# Patient Record
Sex: Male | Born: 1955 | Race: Black or African American | Hispanic: No | Marital: Single | Smoking: Former smoker
Health system: Southern US, Community
[De-identification: ages and names within clinical notes are randomized; demographics above are authoritative.]

## PROBLEM LIST (undated history)

## (undated) DIAGNOSIS — N189 Chronic kidney disease, unspecified: Secondary | ICD-10-CM

## (undated) DIAGNOSIS — C801 Malignant (primary) neoplasm, unspecified: Secondary | ICD-10-CM

## (undated) DIAGNOSIS — I10 Essential (primary) hypertension: Secondary | ICD-10-CM

## (undated) DIAGNOSIS — D631 Anemia in chronic kidney disease: Secondary | ICD-10-CM

## (undated) DIAGNOSIS — M1990 Unspecified osteoarthritis, unspecified site: Secondary | ICD-10-CM

## (undated) DIAGNOSIS — J189 Pneumonia, unspecified organism: Secondary | ICD-10-CM

## (undated) DIAGNOSIS — K759 Inflammatory liver disease, unspecified: Secondary | ICD-10-CM

## (undated) DIAGNOSIS — M109 Gout, unspecified: Secondary | ICD-10-CM

## (undated) HISTORY — DX: Anemia in chronic kidney disease: D63.1

## (undated) HISTORY — PX: HERNIA REPAIR: SHX51

## (undated) HISTORY — DX: Chronic kidney disease, unspecified: N18.9

---

## 1999-06-08 ENCOUNTER — Encounter (HOSPITAL_BASED_OUTPATIENT_CLINIC_OR_DEPARTMENT_OTHER): Payer: Self-pay | Admitting: General Surgery

## 1999-06-08 ENCOUNTER — Encounter: Admission: RE | Admit: 1999-06-08 | Discharge: 1999-06-08 | Payer: Self-pay | Admitting: General Surgery

## 1999-06-11 ENCOUNTER — Ambulatory Visit (HOSPITAL_BASED_OUTPATIENT_CLINIC_OR_DEPARTMENT_OTHER): Admission: RE | Admit: 1999-06-11 | Discharge: 1999-06-11 | Payer: Self-pay | Admitting: General Surgery

## 1999-07-30 ENCOUNTER — Encounter (INDEPENDENT_AMBULATORY_CARE_PROVIDER_SITE_OTHER): Payer: Self-pay | Admitting: Specialist

## 1999-07-30 ENCOUNTER — Ambulatory Visit (HOSPITAL_BASED_OUTPATIENT_CLINIC_OR_DEPARTMENT_OTHER): Admission: RE | Admit: 1999-07-30 | Discharge: 1999-07-30 | Payer: Self-pay | Admitting: General Surgery

## 2000-08-14 ENCOUNTER — Encounter: Payer: Self-pay | Admitting: Family Medicine

## 2000-08-14 ENCOUNTER — Ambulatory Visit (HOSPITAL_COMMUNITY): Admission: RE | Admit: 2000-08-14 | Discharge: 2000-08-14 | Payer: Self-pay | Admitting: Family Medicine

## 2001-07-01 ENCOUNTER — Emergency Department (HOSPITAL_COMMUNITY): Admission: EM | Admit: 2001-07-01 | Discharge: 2001-07-01 | Payer: Self-pay | Admitting: Emergency Medicine

## 2003-11-29 ENCOUNTER — Ambulatory Visit: Payer: Self-pay | Admitting: Family Medicine

## 2003-11-30 ENCOUNTER — Ambulatory Visit: Payer: Self-pay | Admitting: Family Medicine

## 2003-11-30 ENCOUNTER — Ambulatory Visit: Payer: Self-pay | Admitting: *Deleted

## 2004-05-08 ENCOUNTER — Ambulatory Visit: Payer: Self-pay | Admitting: Family Medicine

## 2005-01-14 ENCOUNTER — Ambulatory Visit: Payer: Self-pay | Admitting: Family Medicine

## 2005-01-14 LAB — CONVERTED CEMR LAB: PSA: 0.7 ng/mL

## 2006-12-15 ENCOUNTER — Encounter (INDEPENDENT_AMBULATORY_CARE_PROVIDER_SITE_OTHER): Payer: Self-pay | Admitting: Family Medicine

## 2006-12-15 DIAGNOSIS — Z8619 Personal history of other infectious and parasitic diseases: Secondary | ICD-10-CM | POA: Insufficient documentation

## 2006-12-15 DIAGNOSIS — I10 Essential (primary) hypertension: Secondary | ICD-10-CM | POA: Insufficient documentation

## 2006-12-15 DIAGNOSIS — M1990 Unspecified osteoarthritis, unspecified site: Secondary | ICD-10-CM | POA: Insufficient documentation

## 2006-12-15 DIAGNOSIS — M109 Gout, unspecified: Secondary | ICD-10-CM | POA: Insufficient documentation

## 2007-01-13 ENCOUNTER — Emergency Department (HOSPITAL_COMMUNITY): Admission: EM | Admit: 2007-01-13 | Discharge: 2007-01-13 | Payer: Self-pay | Admitting: Emergency Medicine

## 2010-05-30 ENCOUNTER — Emergency Department (HOSPITAL_COMMUNITY)
Admission: EM | Admit: 2010-05-30 | Discharge: 2010-05-30 | Disposition: A | Discharge status: Home / Self Care | Payer: Self-pay | Attending: Emergency Medicine | Admitting: Emergency Medicine

## 2010-05-30 DIAGNOSIS — R110 Nausea: Secondary | ICD-10-CM | POA: Insufficient documentation

## 2010-05-30 DIAGNOSIS — R42 Dizziness and giddiness: Secondary | ICD-10-CM | POA: Insufficient documentation

## 2010-05-30 DIAGNOSIS — I10 Essential (primary) hypertension: Secondary | ICD-10-CM | POA: Insufficient documentation

## 2010-05-30 LAB — GLUCOSE, CAPILLARY: Glucose-Capillary: 111 mg/dL — ABNORMAL HIGH (ref 70–99)

## 2010-07-27 NOTE — Op Note (Signed)
[place]  Patient:    Brian Lawson, Brian Lawson                      MRN: [id number] Proc. Date: 07/30/99 Adm. Date:  [id number] Disc. Date: [id number] Attending:  Westly Pam Dictator:   Maia Plan. Lindon Romp, M.D.                           Operative Report  PREOPERATIVE DIAGNOSIS:  Incarcerated left inguinal hernia.  POSTOPERATIVE DIAGNOSIS:  Incarcerated left inguinal hernia.  OPERATION PERFORMED:  Repair of incarcerated left inguinal hernia.  SURGEON:  Dr. Lindon Romp.  ANESTHESIA:  General.  DESCRIPTION OF PROCEDURE:  The lower abdomen was prepped and draped in the usual manner.  A transverse curvilinear incision was made in the lower abdominal skin fold and deep and subcutaneous tissues.  The external oblique aponeurosis was opened, thus entering the inguinal canal.  The ilioinguinal nerve was identified and spared.  Spermatic cord was held aside with a Penrose drain.  The patient had a large incarcerated scrotal hernia.  The hernia sac was mobilized and opened, and an incarcerated piece of great omentum was pulled out into the wound.  It could not be returned to the abdominal cavity. It was excised after being clamped off with serial clamps which were tied off with 2-0 silk sutures.  The remainder of the omentum was then placed in the abdominal cavity.  The hernia sac was dissected free.  High ligation was performed with 2-0 silk suture ligature and 2-0 silk tie.  Excess hernia sac was left in the scrotum.  Defect in the floor was repaired, and the internal ring was made smaller using a Marlex patch.  This was fashioned and sewn in place with a two running 2-0 Novofil sutures both starting at the pubic tubercle, one running up the conjoin tendon, one running up shelving portion of Coopers ligament.  Both were tied at the internal ring, and then a collaring suture of 2-0 Novofil was used.  Hemostasis was good.  The external oblique aponeurosis was closed  superficial to the cord with running 2-0 Vicryl.  Vicryl 3-0 was used at the Scarpas fascia.  Skin was closed with subcutaneous 4-0 Dexon and Steri-Strips were applied.  ESTIMATED BLOOD LOSS:  Minimal.  BLOOD:  The patient received no blood.  Left the operating room in satisfactory condition after sponge and needle counts were verified. DD:  08/13/99 TD:  08/15/99 Job: [id number]

## 2010-12-18 LAB — I-STAT 8, (EC8 V) (CONVERTED LAB)
Acid-Base Excess: 3 — ABNORMAL HIGH
BUN: 15
Bicarbonate: 30.9 — ABNORMAL HIGH
Chloride: 106
Glucose, Bld: 114 — ABNORMAL HIGH
HCT: 57 — ABNORMAL HIGH
Hemoglobin: 19.4 — ABNORMAL HIGH
Operator id: 272551
Potassium: 4.6
Sodium: 139
TCO2: 33
pCO2, Ven: 56.3 — ABNORMAL HIGH
pH, Ven: 7.348 — ABNORMAL HIGH

## 2010-12-18 LAB — POCT I-STAT CREATININE
Creatinine, Ser: 1.6 — ABNORMAL HIGH
Operator id: 272551

## 2013-01-01 ENCOUNTER — Emergency Department (HOSPITAL_COMMUNITY): Payer: Medicaid Other

## 2013-01-01 ENCOUNTER — Inpatient Hospital Stay (HOSPITAL_COMMUNITY)
Admission: EM | Admit: 2013-01-01 | Discharge: 2013-01-25 | DRG: 853 | Disposition: A | Discharge status: Skilled Nursing Facility | Payer: Medicaid Other | Attending: Internal Medicine | Admitting: Internal Medicine

## 2013-01-01 ENCOUNTER — Encounter (HOSPITAL_COMMUNITY): Payer: Self-pay | Admitting: Emergency Medicine

## 2013-01-01 DIAGNOSIS — A481 Legionnaires' disease: Secondary | ICD-10-CM | POA: Diagnosis present

## 2013-01-01 DIAGNOSIS — N2581 Secondary hyperparathyroidism of renal origin: Secondary | ICD-10-CM | POA: Diagnosis present

## 2013-01-01 DIAGNOSIS — N179 Acute kidney failure, unspecified: Secondary | ICD-10-CM | POA: Diagnosis present

## 2013-01-01 DIAGNOSIS — R197 Diarrhea, unspecified: Secondary | ICD-10-CM | POA: Diagnosis present

## 2013-01-01 DIAGNOSIS — E875 Hyperkalemia: Secondary | ICD-10-CM | POA: Diagnosis present

## 2013-01-01 DIAGNOSIS — E872 Acidosis, unspecified: Secondary | ICD-10-CM | POA: Diagnosis present

## 2013-01-01 DIAGNOSIS — E860 Dehydration: Secondary | ICD-10-CM | POA: Diagnosis present

## 2013-01-01 DIAGNOSIS — E8809 Other disorders of plasma-protein metabolism, not elsewhere classified: Secondary | ICD-10-CM | POA: Diagnosis present

## 2013-01-01 DIAGNOSIS — R630 Anorexia: Secondary | ICD-10-CM | POA: Diagnosis present

## 2013-01-01 DIAGNOSIS — N186 End stage renal disease: Secondary | ICD-10-CM | POA: Diagnosis present

## 2013-01-01 DIAGNOSIS — M1990 Unspecified osteoarthritis, unspecified site: Secondary | ICD-10-CM

## 2013-01-01 DIAGNOSIS — J9600 Acute respiratory failure, unspecified whether with hypoxia or hypercapnia: Secondary | ICD-10-CM | POA: Diagnosis present

## 2013-01-01 DIAGNOSIS — N170 Acute kidney failure with tubular necrosis: Secondary | ICD-10-CM | POA: Diagnosis present

## 2013-01-01 DIAGNOSIS — J189 Pneumonia, unspecified organism: Secondary | ICD-10-CM

## 2013-01-01 DIAGNOSIS — R809 Proteinuria, unspecified: Secondary | ICD-10-CM | POA: Diagnosis present

## 2013-01-01 DIAGNOSIS — Z87891 Personal history of nicotine dependence: Secondary | ICD-10-CM

## 2013-01-01 DIAGNOSIS — M109 Gout, unspecified: Secondary | ICD-10-CM | POA: Diagnosis present

## 2013-01-01 DIAGNOSIS — R471 Dysarthria and anarthria: Secondary | ICD-10-CM | POA: Diagnosis present

## 2013-01-01 DIAGNOSIS — A419 Sepsis, unspecified organism: Principal | ICD-10-CM | POA: Diagnosis present

## 2013-01-01 DIAGNOSIS — Z8249 Family history of ischemic heart disease and other diseases of the circulatory system: Secondary | ICD-10-CM

## 2013-01-01 DIAGNOSIS — D696 Thrombocytopenia, unspecified: Secondary | ICD-10-CM | POA: Diagnosis present

## 2013-01-01 DIAGNOSIS — Z8619 Personal history of other infectious and parasitic diseases: Secondary | ICD-10-CM

## 2013-01-01 DIAGNOSIS — Q742 Other congenital malformations of lower limb(s), including pelvic girdle: Secondary | ICD-10-CM

## 2013-01-01 DIAGNOSIS — C649 Malignant neoplasm of unspecified kidney, except renal pelvis: Secondary | ICD-10-CM | POA: Diagnosis present

## 2013-01-01 DIAGNOSIS — R479 Unspecified speech disturbances: Secondary | ICD-10-CM | POA: Diagnosis not present

## 2013-01-01 DIAGNOSIS — N2889 Other specified disorders of kidney and ureter: Secondary | ICD-10-CM

## 2013-01-01 DIAGNOSIS — I10 Essential (primary) hypertension: Secondary | ICD-10-CM | POA: Diagnosis present

## 2013-01-01 DIAGNOSIS — G9341 Metabolic encephalopathy: Secondary | ICD-10-CM | POA: Diagnosis present

## 2013-01-01 DIAGNOSIS — I120 Hypertensive chronic kidney disease with stage 5 chronic kidney disease or end stage renal disease: Secondary | ICD-10-CM | POA: Diagnosis present

## 2013-01-01 DIAGNOSIS — N390 Urinary tract infection, site not specified: Secondary | ICD-10-CM | POA: Diagnosis present

## 2013-01-01 HISTORY — DX: Essential (primary) hypertension: I10

## 2013-01-01 HISTORY — DX: Gout, unspecified: M10.9

## 2013-01-01 LAB — COMPREHENSIVE METABOLIC PANEL
AST: 66 U/L — ABNORMAL HIGH (ref 0–37)
Alkaline Phosphatase: 60 U/L (ref 39–117)
BUN: 54 mg/dL — ABNORMAL HIGH (ref 6–23)
CO2: 22 mEq/L (ref 19–32)
Chloride: 99 mEq/L (ref 96–112)
Creatinine, Ser: 3.93 mg/dL — ABNORMAL HIGH (ref 0.50–1.35)
GFR calc non Af Amer: 15 mL/min — ABNORMAL LOW (ref >90)
Total Bilirubin: 0.9 mg/dL (ref 0.3–1.2)

## 2013-01-01 LAB — URINALYSIS, ROUTINE W REFLEX MICROSCOPIC
Bilirubin Urine: NEGATIVE
Glucose, UA: NEGATIVE mg/dL
Ketones, ur: NEGATIVE mg/dL
Leukocytes, UA: NEGATIVE
Protein, ur: 100 mg/dL — AB

## 2013-01-01 LAB — CBC WITH DIFFERENTIAL/PLATELET
Basophils Absolute: 0 10*3/uL (ref 0.0–0.1)
HCT: 45.7 % (ref 39.0–52.0)
Hemoglobin: 15.4 g/dL (ref 13.0–17.0)
Lymphocytes Relative: 8 % — ABNORMAL LOW (ref 12–46)
Monocytes Absolute: 0.7 10*3/uL (ref 0.1–1.0)
Monocytes Relative: 6 % (ref 3–12)
Neutro Abs: 10.2 10*3/uL — ABNORMAL HIGH (ref 1.7–7.7)
WBC: 12 10*3/uL — ABNORMAL HIGH (ref 4.0–10.5)

## 2013-01-01 LAB — URINE MICROSCOPIC-ADD ON

## 2013-01-01 MED ORDER — SODIUM CHLORIDE 0.9 % IV SOLN
INTRAVENOUS | Status: DC
  Administered 2013-01-01 (×2): via INTRAVENOUS

## 2013-01-01 MED ORDER — ALUM & MAG HYDROXIDE-SIMETH 200-200-20 MG/5ML PO SUSP
30.0000 mL | Freq: Four times a day (QID) | ORAL | Status: DC | PRN
  Filled 2013-01-01: qty 30

## 2013-01-01 MED ORDER — VANCOMYCIN HCL IN DEXTROSE 1-5 GM/200ML-% IV SOLN
1000.0000 mg | Freq: Once | INTRAVENOUS | Status: AC
  Administered 2013-01-01: 1000 mg via INTRAVENOUS
  Filled 2013-01-01: qty 200

## 2013-01-01 MED ORDER — ONDANSETRON HCL 4 MG PO TABS
4.0000 mg | ORAL_TABLET | Freq: Four times a day (QID) | ORAL | Status: DC | PRN
  Administered 2013-01-06 – 2013-01-22 (×3): 4 mg via ORAL
  Filled 2013-01-01 (×3): qty 1

## 2013-01-01 MED ORDER — SODIUM CHLORIDE 0.9 % IV BOLUS (SEPSIS)
1000.0000 mL | Freq: Once | INTRAVENOUS | Status: AC
  Administered 2013-01-01: 1000 mL via INTRAVENOUS

## 2013-01-01 MED ORDER — ACETAMINOPHEN 500 MG PO TABS
1000.0000 mg | ORAL_TABLET | Freq: Once | ORAL | Status: AC
  Administered 2013-01-01: 1000 mg via ORAL
  Filled 2013-01-01: qty 2

## 2013-01-01 MED ORDER — ACETAMINOPHEN 650 MG RE SUPP: 650.0000 mg | Freq: Four times a day (QID) | RECTAL | Status: DC | PRN

## 2013-01-01 MED ORDER — SODIUM CHLORIDE 0.9 % IV SOLN: INTRAVENOUS | Status: AC

## 2013-01-01 MED ORDER — IBUPROFEN 800 MG PO TABS
800.0000 mg | ORAL_TABLET | Freq: Once | ORAL | Status: AC
  Administered 2013-01-01: 800 mg via ORAL
  Filled 2013-01-01: qty 1

## 2013-01-01 MED ORDER — PIPERACILLIN-TAZOBACTAM 3.375 G IVPB 30 MIN
3.3750 g | Freq: Three times a day (TID) | INTRAVENOUS | Status: DC
  Filled 2013-01-01: qty 50

## 2013-01-01 MED ORDER — ONDANSETRON HCL 4 MG/2ML IJ SOLN
4.0000 mg | Freq: Four times a day (QID) | INTRAMUSCULAR | Status: DC | PRN
  Administered 2013-01-06 – 2013-01-10 (×2): 4 mg via INTRAVENOUS
  Filled 2013-01-01 (×2): qty 2

## 2013-01-01 MED ORDER — OXYCODONE HCL 5 MG PO TABS
5.0000 mg | ORAL_TABLET | ORAL | Status: DC | PRN
  Administered 2013-01-03 – 2013-01-18 (×8): 5 mg via ORAL
  Filled 2013-01-01 (×9): qty 1

## 2013-01-01 MED ORDER — HYDROMORPHONE HCL PF 1 MG/ML IJ SOLN
0.5000 mg | INTRAMUSCULAR | Status: DC | PRN
  Administered 2013-01-03 – 2013-01-19 (×4): 1 mg via INTRAVENOUS
  Administered 2013-01-20: 0.5 mg via INTRAVENOUS
  Administered 2013-01-21: 1 mg via INTRAVENOUS
  Filled 2013-01-01 (×6): qty 1

## 2013-01-01 MED ORDER — PIPERACILLIN-TAZOBACTAM IN DEX 2-0.25 GM/50ML IV SOLN
2.2500 g | Freq: Four times a day (QID) | INTRAVENOUS | Status: DC
  Administered 2013-01-02 (×2): 2.25 g via INTRAVENOUS
  Filled 2013-01-01 (×4): qty 50

## 2013-01-01 MED ORDER — ENOXAPARIN SODIUM 30 MG/0.3ML ~~LOC~~ SOLN
30.0000 mg | SUBCUTANEOUS | Status: DC
  Administered 2013-01-01 – 2013-01-04 (×3): 30 mg via SUBCUTANEOUS
  Filled 2013-01-01 (×4): qty 0.3

## 2013-01-01 MED ORDER — ACETAMINOPHEN 325 MG PO TABS
650.0000 mg | ORAL_TABLET | Freq: Four times a day (QID) | ORAL | Status: DC | PRN
  Administered 2013-01-02 (×2): 650 mg via ORAL
  Filled 2013-01-01 (×2): qty 2

## 2013-01-01 MED ORDER — PIPERACILLIN-TAZOBACTAM 3.375 G IVPB 30 MIN
3.3750 g | Freq: Once | INTRAVENOUS | Status: AC
  Administered 2013-01-01: 3.375 g via INTRAVENOUS
  Filled 2013-01-01: qty 50

## 2013-01-01 NOTE — ED Provider Notes (Signed)
CSN: [id number]     Arrival date & time 01/01/13  1756 History   First MD Initiated Contact with Patient 01/01/13 1804     Chief Complaint  Patient presents with  . Loss of Consciousness  . Medication Reaction   (Consider location/radiation/quality/duration/timing/severity/associated sxs/prior Treatment) HPI Comments: 57 yo male with htn, gout hx presents with confusion, general weakness, cough and diarrhea for three days.  Parents found him lying down on the ground unable to lift himself bc general weakness. Pt has not felt well for three days, mild diarrhea and cough.  No sick or jail contacts, no travel, no known HIV or immunodeficiency.   No recent hospitalizations.  Nothing improves sxs.    Patient is a 57 y.o. male presenting with syncope. The history is provided by the patient and a relative.  Loss of Consciousness Associated symptoms: fever, headaches (mild frontal similar previous), nausea and weakness   Associated symptoms: no chest pain, no shortness of breath and no vomiting     Past Medical History  Diagnosis Date  . Hypertension   . Gout    Past Surgical History  Procedure Laterality Date  . Hernia repair     History reviewed. No pertinent family history. History  Substance Use Topics  . Smoking status: Former Research scientist (life sciences)  . Smokeless tobacco: Never Used  . Alcohol Use: Yes     Comment: ocassionally    Review of Systems  Constitutional: Positive for fever, chills and appetite change.  HENT: Negative for congestion and drooling.   Eyes: Negative for visual disturbance.  Respiratory: Positive for cough. Negative for shortness of breath.   Cardiovascular: Positive for syncope. Negative for chest pain.  Gastrointestinal: Positive for nausea and diarrhea. Negative for vomiting and abdominal pain.  Genitourinary: Negative for dysuria.  Musculoskeletal: Negative for back pain.  Skin: Negative for rash.  Neurological: Positive for weakness, light-headedness and  headaches (mild frontal similar previous).    Allergies  Review of patient's allergies indicates no known allergies.  Home Medications   Current Outpatient Rx  Name  Route  Sig  Dispense  Refill  . allopurinol (ZYLOPRIM) 100 MG tablet   Oral   Take 100 mg by mouth daily.         Marland Kitchen lisinopril-hydrochlorothiazide (PRINZIDE,ZESTORETIC) 10-12.5 MG per tablet   Oral   Take 1 tablet by mouth daily.          BP 133/84  Pulse 111  Temp(Src) 103 F (39.4 C) (Oral)  Resp 39  SpO2 95% Physical Exam  Nursing note and vitals reviewed. Constitutional: He is oriented to person, place, and time. He appears well-developed and well-nourished.  HENT:  Head: Normocephalic and atraumatic.  Eyes: Conjunctivae are normal. Right eye exhibits no discharge. Left eye exhibits no discharge.  Neck: Normal range of motion. Neck supple. No tracheal deviation present.  Cardiovascular: Normal rate and regular rhythm.   Pulmonary/Chest: Effort normal. He has rales (few rales at bases).  Abdominal: Soft. He exhibits no distension. There is no tenderness. There is no guarding.  Musculoskeletal: He exhibits no edema.  Neurological: He is alert and oriented to person, place, and time. No cranial nerve deficit.  General weakness Mild lethargy Neck supple, full rom, chin to chest without difficulty Moves all ext equal bilateral  Skin: Skin is warm. No rash noted.  Psychiatric: He has a normal mood and affect.    ED Course  Procedures (including critical care time) Labs Review Labs Reviewed  CBC WITH DIFFERENTIAL -  Abnormal; Notable for the following:    WBC 12.0 (*)    RDW 16.0 (*)    Platelets 112 (*)    Neutrophils Relative % 85 (*)    Neutro Abs 10.2 (*)    Lymphocytes Relative 8 (*)    All other components within normal limits  COMPREHENSIVE METABOLIC PANEL - Abnormal; Notable for the following:    Glucose, Bld 140 (*)    BUN 54 (*)    Creatinine, Ser 3.93 (*)    Total Protein 8.4 (*)     Albumin 3.0 (*)    AST 66 (*)    GFR calc non Af Amer 15 (*)    GFR calc Af Amer 18 (*)    All other components within normal limits  LACTIC ACID, PLASMA - Abnormal; Notable for the following:    Lactic Acid, Venous 2.4 (*)    All other components within normal limits  URINALYSIS, ROUTINE W REFLEX MICROSCOPIC - Abnormal; Notable for the following:    APPearance CLOUDY (*)    Hgb urine dipstick LARGE (*)    Protein, ur 100 (*)    All other components within normal limits  SALICYLATE LEVEL - Abnormal; Notable for the following:    Salicylate Lvl 123456 (*)    All other components within normal limits  URINE MICROSCOPIC-ADD ON - Abnormal; Notable for the following:    Bacteria, UA MANY (*)    All other components within normal limits  CULTURE, BLOOD (ROUTINE X 2)  CULTURE, BLOOD (ROUTINE X 2)   Imaging Review Ct Head Wo Contrast  01/01/2013   CLINICAL DATA:  Loss of consciousness  EXAM: CT HEAD WITHOUT CONTRAST  TECHNIQUE: Contiguous axial images were obtained from the base of the skull through the vertex without intravenous contrast.  COMPARISON:  01/13/2007  FINDINGS: No skull fracture is noted. Paranasal sinuses and mastoid air cells are unremarkable. No intracranial hemorrhage, mass effect or midline shift. Stable patchy subcortical white matter decreased attenuation consistent with chronic small vessel ischemic changes. No definite acute cortical infarction. No mass lesion is noted on this unenhanced scan. Ventricular size is stable from prior exam.  IMPRESSION: No acute intracranial abnormality. Stable patchy subcortical white matter decreased attenuation consistent with chronic small vessel ischemic changes.   Electronically Signed   By: Lahoma Crocker M.D.   On: 01/01/2013 19:39   Dg Chest Port 1 View  01/01/2013   CLINICAL DATA:  Fever.  EXAM: PORTABLE CHEST - 1 VIEW  COMPARISON:  None.  FINDINGS: Cardiopericardial silhouette and mediastinal contours appear within normal limits. There  is patchy density in the left base favored to represent airspace disease/ pneumonia over atelectasis. This likely resides within the left lower lobe. Increased retrocardiac density is present. Monitoring leads project over the chest. Followup to ensure radiographic clearing and exclude an underlying lesion is recommended. Typically clearing will be observed at 8 weeks.  IMPRESSION: Left lower lobe patchy airspace disease compatible with pneumonia.   Electronically Signed   By: Dereck Ligas M.D.   On: 01/01/2013 19:07    EKG Interpretation   None       MDM   1. Severe sepsis   2. CAP (community acquired pneumonia)    Septic appearing, labs/ cultures/ abx.  Pt improved in ED with 2 L fluids, rechecked multiple times. Broad abx given.  No risk factors known per family.   Pneumonia on cxr, reviewed.   The patients results and plan were reviewed and discussed.  Any x-rays performed were personally reviewed by myself.   Differential diagnosis were considered with the presenting HPI.  Diagnosis: Severe sepsis, CAP, Dehydration, UTI  Discussed with Dr Arnoldo Morale, accepted step down, updated pt and family.  Admission/ observation were discussed with the admitting physician, patient and/or family and they are comfortable with the plan.      Mariea Clonts, MD 01/01/13 2132

## 2013-01-01 NOTE — Progress Notes (Signed)
Unit CM UR Completed by [facility] ED CM  W. Beaux Verne RN  

## 2013-01-01 NOTE — Progress Notes (Addendum)
ANTIBIOTIC CONSULT NOTE - INITIAL  Pharmacy Consult for vancomycin Indication: pneumonia  No Known Allergies  Patient Measurements: Wt= 97.1kg  Vital Signs: Temp: 103 F (39.4 C) (10/24 2000) Temp src: Oral (10/24 2000) BP: 133/84 mmHg (10/24 2100) Pulse Rate: 111 (10/24 2100) Intake/Output from previous day:   Intake/Output from this shift: Total I/O In: -  Out: 300 [Urine:300]  Labs:  Recent Labs  01/01/13 1859  WBC 12.0*  HGB 15.4  PLT 112*  CREATININE 3.93*   CrCl is unknown because there is no height on file for the current visit. No results found for this basename: VANCOTROUGH, VANCOPEAK, VANCORANDOM, GENTTROUGH, GENTPEAK, GENTRANDOM, TOBRATROUGH, TOBRAPEAK, TOBRARND, AMIKACINPEAK, AMIKACINTROU, AMIKACIN,  in the last 72 hours   Microbiology: No results found for this or any previous visit (from the past 720 hour(s)).  Medical History: Past Medical History  Diagnosis Date  . Hypertension   . Gout     Assessment: 57 yo male with PNA to begin vancomycin per pharmacy consult (also noted on zosyn). WBC= 12, tmax= 105.5, SCr= 3.93 and estimated CrCl ~ 20. Vancomycin 1000mg  IV was given in ED at 9:21pm today. Last SCr on record was 1.6 in 2008.  Abx 10/24 zosyn 10/24 vancomycin  Cx 10/24 blood x2  Goal of Therapy:  Vancomycin trough level 15-20 mcg/ml  Plan: -Vancomycin 1000mg  IV (to complete a total load of 2000mg )  -Will follow SCr trend in am to determine maintenance dosing -Will follow renal function, cultures and clinical progress -Change zosyn to 2.25gm IV q6h  Hildred Laser, Pharm D 01/01/2013 10:29 PM

## 2013-01-01 NOTE — ED Notes (Signed)
[facility] presents with a 57 yo male from home found unresponsive by mother.  Pt was found on the floor and incontinent.  Mother states she had not seen pt since Monday and pt was fine at that time.  Pt c/o diarrhea and anorexia for past 3 days with weakness.  Pt is currently A&Ox4.  Pt stated that he may have taken more of his gout medication than he should and didn't want to come to hospital.  Hx of gout and HTN.

## 2013-01-02 ENCOUNTER — Inpatient Hospital Stay (HOSPITAL_COMMUNITY): Payer: Medicaid Other

## 2013-01-02 DIAGNOSIS — J189 Pneumonia, unspecified organism: Secondary | ICD-10-CM

## 2013-01-02 DIAGNOSIS — M109 Gout, unspecified: Secondary | ICD-10-CM

## 2013-01-02 DIAGNOSIS — A481 Legionnaires' disease: Secondary | ICD-10-CM | POA: Diagnosis present

## 2013-01-02 DIAGNOSIS — N179 Acute kidney failure, unspecified: Secondary | ICD-10-CM | POA: Diagnosis present

## 2013-01-02 DIAGNOSIS — A419 Sepsis, unspecified organism: Principal | ICD-10-CM

## 2013-01-02 DIAGNOSIS — N390 Urinary tract infection, site not specified: Secondary | ICD-10-CM

## 2013-01-02 DIAGNOSIS — I10 Essential (primary) hypertension: Secondary | ICD-10-CM

## 2013-01-02 DIAGNOSIS — R6520 Severe sepsis without septic shock: Secondary | ICD-10-CM | POA: Diagnosis present

## 2013-01-02 LAB — PROCALCITONIN: Procalcitonin: 41.39 ng/mL

## 2013-01-02 LAB — BASIC METABOLIC PANEL
BUN: 59 mg/dL — ABNORMAL HIGH (ref 6–23)
CO2: 20 mEq/L (ref 19–32)
Calcium: 8.5 mg/dL (ref 8.4–10.5)
Creatinine, Ser: 4.67 mg/dL — ABNORMAL HIGH (ref 0.50–1.35)
Glucose, Bld: 122 mg/dL — ABNORMAL HIGH (ref 70–99)
Sodium: 138 mEq/L (ref 135–145)

## 2013-01-02 LAB — CBC
HCT: 43.6 % (ref 39.0–52.0)
MCH: 28.9 pg (ref 26.0–34.0)
MCHC: 34.2 g/dL (ref 30.0–36.0)
MCV: 84.7 fL (ref 78.0–100.0)
Platelets: 100 10*3/uL — ABNORMAL LOW (ref 150–400)
RDW: 16.1 % — ABNORMAL HIGH (ref 11.5–15.5)
WBC: 11.7 10*3/uL — ABNORMAL HIGH (ref 4.0–10.5)

## 2013-01-02 MED ORDER — BOOST / RESOURCE BREEZE PO LIQD
1.0000 | Freq: Three times a day (TID) | ORAL | Status: DC
  Administered 2013-01-04 – 2013-01-16 (×19): 1 via ORAL
  Filled 2013-01-02 (×3): qty 1

## 2013-01-02 MED ORDER — CEFTRIAXONE SODIUM 1 G IJ SOLR
1.0000 g | INTRAMUSCULAR | Status: DC
  Administered 2013-01-02 – 2013-01-03 (×2): 1 g via INTRAVENOUS
  Filled 2013-01-02 (×2): qty 10

## 2013-01-02 MED ORDER — DEXTROSE 5 % IV SOLN
500.0000 mg | INTRAVENOUS | Status: DC
  Administered 2013-01-02 – 2013-01-03 (×2): 500 mg via INTRAVENOUS
  Filled 2013-01-02 (×3): qty 500

## 2013-01-02 MED ORDER — SODIUM CHLORIDE 0.9 % IV BOLUS (SEPSIS)
500.0000 mL | Freq: Once | INTRAVENOUS | Status: AC
  Administered 2013-01-02: 500 mL via INTRAVENOUS

## 2013-01-02 NOTE — H&P (Signed)
[facility] History and Physical  Akzel Lawson [id number] DOB: June 28, 1955 DOA: 01/01/2013  Referring physician: EDP PCP: No PCP Per Patient  Specialists:   Chief Complaint: Unresponsive  HPI: Brian Lawson is a 57 y.o. male with a history of HTN and Gout who was found on the floor in his home by his mother this evening.  EMS was called.    His mother reports that he had been ill and had diarrhea and coughing with increased weakness and poor intake of foods and liquids over the past 3 days.   In the ED, he had been found to have a fever to 105 degrees and on chest X-ray he was found to have a LLL Pneumonia.   A ct scan of the Head was also performed and found to be negative for acute findings.   He was placed on IV Vancomycin and Zosyn and referred for admission.      Review of Systems: The patient denies headaches, weight loss, vision loss, diplopia, dizziness, decreased hearing, rhinitis, hoarseness, chest pain, syncope, dyspnea on exertion, peripheral edema, balance deficits, cough, hemoptysis, abdominal pain, nausea, vomiting, diarrhea, constipation, hematemesis, melena, hematochezia, severe indigestion/heartburn, dysuria, hematuria, incontinence, muscle weakness, suspicious skin lesions, transient blindness, difficulty walking, depression, unusual weight change, abnormal bleeding, enlarged lymph nodes, angioedema, and breast masses.    Past Medical History  Diagnosis Date  . Hypertension   . Gout     Past Surgical History  Procedure Laterality Date  . Hernia repair      Prior to Admission medications   Medication Sig Start Date End Date Taking? Authorizing Provider  allopurinol (ZYLOPRIM) 100 MG tablet Take 100 mg by mouth daily.   Yes Historical Provider, MD  lisinopril-hydrochlorothiazide (PRINZIDE,ZESTORETIC) 10-12.5 MG per tablet Take 1 tablet by mouth daily.   Yes Historical Provider, MD    No Known Allergies  Social History:  reports that he has quit smoking.  He has never used smokeless tobacco. He reports that he drinks alcohol. He reports that he does not use illicit drugs.     Family History:     HTN in Mother and Sister      Physical Exam:  GEN:  Pleasant Obese  57 y.o. African American male  examined  and in no acute distress; cooperative with exam Filed Vitals:   01/01/13 2230 01/01/13 2245 01/01/13 2300 01/01/13 2338  BP: 126/89 115/88 129/85   Pulse: 99 97 95 91  Temp:    99.2 F (37.3 C)  TempSrc:    Oral  Resp: 20 27 23 30   Height:      Weight:      SpO2: 98% 99% 99% 97%   Blood pressure 129/85, pulse 91, temperature 99.2 F (37.3 C), temperature source Oral, resp. rate 30, height 6\' 3"  (1.905 m), weight 97 kg (213 lb 13.5 oz), SpO2 97.00%. PSYCH: He is alert and oriented x4; does not appear anxious does not appear depressed; affect is normal HEENT: Normocephalic and Atraumatic, Mucous membranes pink; PERRLA; EOM intact; Fundi:  Benign;  No scleral icterus, Nares: Patent, Oropharynx: Clear,Fair Dentition, Neck:  FROM, no cervical lymphadenopathy nor thyromegaly or carotid bruit; no JVD; Breasts:: Not examined CHEST WALL: No tenderness CHEST: Normal respiration, clear to auscultation bilaterally HEART: Regular rate and rhythm; no murmurs rubs or gallops BACK: No kyphosis or scoliosis; no CVA tenderness ABDOMEN: Positive Bowel Sounds, Obese, soft non-tender; no masses, no organomegaly, no pannus; no intertriginous candida. Rectal Exam: Not done EXTREMITIES: No cyanosis, clubbing or  edema; no ulcerations. Genitalia: not examined PULSES: 2+ and symmetric SKIN: Normal hydration no rash or ulceration CNS: Cranial nerves 2-12 grossly intact no focal neurologic deficit    Labs on Admission:  Basic Metabolic Panel:  Recent Labs Lab 01/01/13 1859  NA 136  K 4.0  CL 99  CO2 22  GLUCOSE 140*  BUN 54*  CREATININE 3.93*  CALCIUM 9.2   Liver Function Tests:  Recent Labs Lab 01/01/13 1859  AST 66*  ALT 29  ALKPHOS  60  BILITOT 0.9  PROT 8.4*  ALBUMIN 3.0*   No results found for this basename: LIPASE, AMYLASE,  in the last 168 hours No results found for this basename: AMMONIA,  in the last 168 hours CBC:  Recent Labs Lab 01/01/13 1859  WBC 12.0*  NEUTROABS 10.2*  HGB 15.4  HCT 45.7  MCV 84.0  PLT 112*   Cardiac Enzymes: No results found for this basename: CKTOTAL, CKMB, CKMBINDEX, TROPONINI,  in the last 168 hours  BNP (last 3 results) No results found for this basename: PROBNP,  in the last 8760 hours CBG: No results found for this basename: GLUCAP,  in the last 168 hours  Radiological Exams on Admission: Ct Head Wo Contrast  01/01/2013   CLINICAL DATA:  Loss of consciousness  EXAM: CT HEAD WITHOUT CONTRAST  TECHNIQUE: Contiguous axial images were obtained from the base of the skull through the vertex without intravenous contrast.  COMPARISON:  01/13/2007  FINDINGS: No skull fracture is noted. Paranasal sinuses and mastoid air cells are unremarkable. No intracranial hemorrhage, mass effect or midline shift. Stable patchy subcortical white matter decreased attenuation consistent with chronic small vessel ischemic changes. No definite acute cortical infarction. No mass lesion is noted on this unenhanced scan. Ventricular size is stable from prior exam.  IMPRESSION: No acute intracranial abnormality. Stable patchy subcortical white matter decreased attenuation consistent with chronic small vessel ischemic changes.   Electronically Signed   By: Lahoma Crocker M.D.   On: 01/01/2013 19:39   Dg Chest Port 1 View  01/01/2013   CLINICAL DATA:  Fever.  EXAM: PORTABLE CHEST - 1 VIEW  COMPARISON:  None.  FINDINGS: Cardiopericardial silhouette and mediastinal contours appear within normal limits. There is patchy density in the left base favored to represent airspace disease/ pneumonia over atelectasis. This likely resides within the left lower lobe. Increased retrocardiac density is present. Monitoring leads  project over the chest. Followup to ensure radiographic clearing and exclude an underlying lesion is recommended. Typically clearing will be observed at 8 weeks.  IMPRESSION: Left lower lobe patchy airspace disease compatible with pneumonia.   Electronically Signed   By: Dereck Ligas M.D.   On: 01/01/2013 19:07      Assessment/Plan Principal Problem:   Severe sepsis Active Problems:   GOUT   HYPERTENSION   CAP (community acquired pneumonia)   UTI (lower urinary tract infection)    ARF   1.   Sepsis-  Pan Cultures sent, and placed on IV Vancomycin and Zosyn.    2.   CAP-  Covered by IV Vanc and Zosyn, Rx with albuterol nebs and O2 PRN.    3.  UTI-  Covered by IV Zosyn, adjust pending Culture Results.    4.  GOUT- check Uric Acid level.    5.  ARF-  Acute versus chronic, IVFs, and monitor trend of BUN/Cr.  Hold the Prinizide rx ( Combination Ace Inhibitor and  HCTZ)  6.  DVT prophylaxis wit Lovenox.  Code Status:    FULL CODE   Family Communication:   Mother at bedside  Disposition Plan:     Inpatient Time spent:   [facility] Pager [phone]  If 7PM-7AM, please contact night-coverage [email or web address] Password [email or web address] 01/02/2013, 1:00 AM

## 2013-01-02 NOTE — Progress Notes (Signed)
[facility] Progress Note [place] TEAM 1 - Stepdown/ICU TEAM   Brian Lawson [id number] DOB: January 25, 1956 DOA: 01/01/2013 PCP: "[facility] " - per pt   Brief narrative: 57 y/o male who presented to ER after found on the floor by his mother. 3 days of diarrhea and cough- fever of 105 in the ER. Has a h/o Gout and HTN- has no h/o chronic renal failure as far as he knows. Last saw his doctor about 3 mo ago. Has no urinary complaints.   Assessment/Plan: Principal Problem:   Severe sepsis/  CAP -initialy suspicion was CAP- interestingly he is on room air now with pulse ox of 95% - change antibiotics from vanc and Zosyn to Zithro and Rocephin to appropriately treat CAP - obtain urine strep and legionella - obtain flu PCR - order sputum culture - f/u on blood cx   Active Problems: ARF - obtain renal ultrasound - anuric- place foley to measure output - noted to be on ACE at home therefore may be ATN - cont IVF     GOUT - cont Allopurinol    HYPERTENSION - BP low- hold ACE/ HCTZ combination  Thrombocytopenia - likely due to sepsis- follow  Diarrhea?  - none here - obtain stool studies if BM  - not eating solids but not nauseated- anorexia   Code Status: full code Family Communication: none Disposition Plan: follow in SDU  Consultants: none  Procedures: none  Antibiotics: Vanc/ Zosyn 10/25- stopped today  DVT prophylaxis: SCDs  HPI/Subjective: Pt currently states he has no cough or dyspnea. No diarrhea. No dysuria, hematuria or flank pain.   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    Objective: Blood pressure 113/90, pulse 83, temperature 98.9 F (37.2 C), temperature source Oral, resp. rate 18, height 6\' 3"  (1.905 m), weight 97 kg (213 lb 13.5 oz), SpO2 99.00%.  Intake/Output Summary (Last 24 hours) at 01/02/13 1340 Last data filed at 01/02/13 0858  Gross per 24  hour  Intake     90 ml  Output    300 ml  Net   -210 ml     Exam: General: odd affect- appears to be detached- answering questions appropriates though- No acute respiratory distress Lungs: Clear to auscultation bilaterally without wheezes or crackles Cardiovascular: Regular rate and rhythm without murmur gallop or rub normal S1 and S2 Abdomen: Nontender, nondistended, soft, bowel sounds positive, no rebound, no ascites, no appreciable mass Extremities: No significant cyanosis, clubbing, or edema bilateral lower extremities  Data Reviewed: Basic Metabolic Panel:  Recent Labs Lab 01/01/13 1859 01/02/13 0509  NA 136 138  K 4.0 4.5  CL 99 104  CO2 22 20  GLUCOSE 140* 122*  BUN 54* 59*  CREATININE 3.93* 4.67*  CALCIUM 9.2 8.5   Liver Function Tests:  Recent Labs Lab 01/01/13 1859  AST 66*  ALT 29  ALKPHOS 60  BILITOT 0.9  PROT 8.4*  ALBUMIN 3.0*   No results found for this basename: LIPASE, AMYLASE,  in the last 168 hours No results found for this basename: AMMONIA,  in the last 168 hours CBC:  Recent Labs Lab 01/01/13 1859 01/02/13 0509  WBC 12.0* 11.7*  NEUTROABS 10.2*  --   HGB 15.4 14.9  HCT 45.7 43.6  MCV 84.0 84.7  PLT 112* 100*   Cardiac Enzymes: No results found for this basename: CKTOTAL, CKMB, CKMBINDEX, TROPONINI,  in  the last 168 hours BNP (last 3 results) No results found for this basename: PROBNP,  in the last 8760 hours CBG: No results found for this basename: GLUCAP,  in the last 168 hours  Recent Results (from the past 240 hour(s))  MRSA PCR SCREENING     Status: None   Collection Time    01/01/13 10:25 PM      Result Value Range Status   MRSA by PCR NEGATIVE  NEGATIVE Final   Comment:            The GeneXpert MRSA Assay (FDA     approved for NASAL specimens     only), is one component of a     comprehensive MRSA colonization     surveillance program. It is not     intended to diagnose MRSA     infection nor to guide or      monitor treatment for     MRSA infections.     Studies:  Recent x-ray studies have been reviewed in detail by the Attending Physician  Scheduled Meds:  Scheduled Meds: . sodium chloride   Intravenous STAT  . azithromycin  500 mg Intravenous Q24H  . cefTRIAXone (ROCEPHIN)  IV  1 g Intravenous Q24H  . enoxaparin (LOVENOX) injection  30 mg Subcutaneous Q24H   Continuous Infusions: . sodium chloride 100 mL/hr at 01/02/13 0600    Time spent on care of this patient: 40 min   Debbe Odea, MD  [facility] Office  [phone] Pager - Text Page per Shea Evans as per below:  On-Call/Text Page:      [email or web address]      password [email or web address]  If 7PM-7AM, please contact night-coverage [email or web address] Password [email or web address] 01/02/2013, 1:40 PM   LOS: 1 day

## 2013-01-03 ENCOUNTER — Inpatient Hospital Stay (HOSPITAL_COMMUNITY): Payer: Medicaid Other

## 2013-01-03 DIAGNOSIS — R4789 Other speech disturbances: Secondary | ICD-10-CM

## 2013-01-03 DIAGNOSIS — R55 Syncope and collapse: Secondary | ICD-10-CM

## 2013-01-03 LAB — BASIC METABOLIC PANEL
BUN: 80 mg/dL — ABNORMAL HIGH (ref 6–23)
CO2: 18 mEq/L — ABNORMAL LOW (ref 19–32)
Chloride: 100 mEq/L (ref 96–112)
Glucose, Bld: 95 mg/dL (ref 70–99)
Potassium: 4.6 mEq/L (ref 3.5–5.1)
Sodium: 135 mEq/L (ref 135–145)

## 2013-01-03 LAB — CBC
Hemoglobin: 13.2 g/dL (ref 13.0–17.0)
MCH: 28 pg (ref 26.0–34.0)
MCHC: 34 g/dL (ref 30.0–36.0)
MCV: 82.4 fL (ref 78.0–100.0)
RBC: 4.71 MIL/uL (ref 4.22–5.81)

## 2013-01-03 LAB — PROTEIN / CREATININE RATIO, URINE
Creatinine, Urine: 73.38 mg/dL
Protein Creatinine Ratio: 0.87 — ABNORMAL HIGH (ref 0.00–0.15)
Total Protein, Urine: 63.5 mg/dL

## 2013-01-03 LAB — SODIUM, URINE, RANDOM: Sodium, Ur: 56 mEq/L

## 2013-01-03 LAB — LEGIONELLA ANTIGEN, URINE

## 2013-01-03 MED ORDER — SODIUM CHLORIDE 0.9 % IV BOLUS (SEPSIS)
2000.0000 mL | Freq: Once | INTRAVENOUS | Status: AC
  Administered 2013-01-03: 2000 mL via INTRAVENOUS

## 2013-01-03 MED ORDER — ACETAMINOPHEN 325 MG PO TABS
650.0000 mg | ORAL_TABLET | ORAL | Status: DC | PRN
  Administered 2013-01-03 – 2013-01-04 (×3): 650 mg via ORAL
  Filled 2013-01-03 (×3): qty 2

## 2013-01-03 MED ORDER — ACETAMINOPHEN 650 MG RE SUPP: 650.0000 mg | RECTAL | Status: DC | PRN

## 2013-01-03 MED ORDER — SODIUM BICARBONATE 8.4 % IV SOLN
INTRAVENOUS | Status: DC
  Administered 2013-01-03 – 2013-01-04 (×2): via INTRAVENOUS
  Filled 2013-01-03 (×6): qty 150

## 2013-01-03 NOTE — Progress Notes (Signed)
Urine legionella positive- d/c Rocephin. Cont supportive treatment.   Debbe Odea, MD

## 2013-01-03 NOTE — Consult Note (Signed)
Renal Service Consult Note [facility]  Brian Lawson 01/03/2013 [place] Requesting Physician:  Dr Wynelle Cleveland  Reason for Consult: Renal failure HPI: The patient is a 57 y.o. year-old with hx of HTN and gout taking allopurinol, lisinopril and HCTZ as only meds presented brought to ed by ems after being found by mother unresponsive at home on the floor, incontinent; on arrival was oriented. cxr showed LLL process and he was admitted and given ivf's and iv abx. Creat on admit was up at 3.93 on 10/24 and yesterday was 4.67 and today is 6.49, inspite of ivf's; he doesn't see doctor's regularly.  Denies any nsaid use, denies hx of kidney problems, kidney stones, dysuria or hematuria in the past. He says he has been sick for a week w fevers , diarrhea and chills  ROS  no jt pain, hair loss or mouth sores  no cp+ cough  no sob  no abd pain  no skin rash  Past Medical History  Past Medical History  Diagnosis Date  . Hypertension   . Gout    Past Surgical History  Past Surgical History  Procedure Laterality Date  . Hernia repair     Family History History reviewed. No pertinent family history. Social History  reports that he has quit smoking. He has never used smokeless tobacco. He reports that he drinks alcohol. He reports that he does not use illicit drugs. Allergies No Known Allergies Home medications Prior to Admission medications   Medication Sig Start Date End Date Taking? Authorizing Provider  allopurinol (ZYLOPRIM) 100 MG tablet Take 100 mg by mouth daily.   Yes Historical Provider, MD  lisinopril-hydrochlorothiazide (PRINZIDE,ZESTORETIC) 10-12.5 MG per tablet Take 1 tablet by mouth daily.   Yes Historical Provider, MD   Liver Function Tests  Recent Labs Lab 01/01/13 1859  AST 66*  ALT 29  ALKPHOS 60  BILITOT 0.9  PROT 8.4*  ALBUMIN 3.0*   No results found for this basename: LIPASE, AMYLASE,  in the last 168 hours CBC  Recent Labs Lab  01/01/13 1859 01/02/13 0509 01/03/13 0450  WBC 12.0* 11.7* 12.1*  NEUTROABS 10.2*  --   --   HGB 15.4 14.9 13.2  HCT 45.7 43.6 38.8*  MCV 84.0 84.7 82.4  PLT 112* 100* [id number]*   Basic Metabolic Panel  Recent Labs Lab 01/01/13 1859 01/02/13 0509 01/03/13 0450  NA 136 138 135  K 4.0 4.5 4.6  CL 99 104 100  CO2 22 20 18*  GLUCOSE 140* 122* 95  BUN 54* 59* 80*  CREATININE 3.93* 4.67* 6.49*  CALCIUM 9.2 8.5 7.8*     physical exam  Blood pressure 122/86, pulse 95, temperature 99.2 F (37.3 C), temperature source Oral, resp. rate 17, height 6\' 3"  (1.905 m), weight 97 kg (213 lb 13.5 oz), SpO2 98.00%.  gen: alert, adult AAM in no distress, calm slightly slurred speech  skin: no rash, cyanosis  heent: eomi, sclera anicteric, throat moist  neck: flat neck veins, no jvd, no lan  chest: clear bilat, no rales or wheezing  cor: regular, no murmur or rub, pedal pulses intact  abdomen: soft, nt, nd, no ascites or hsm  extremities: no leg or arm edema, no joint effusion, no gangrene/ulcers  neuro: alert, ox3, nf   ua cloudy, 100 prot, 0-2wbc, 3-6rbc, many bact  renal US  10-11 cm kidneys, no hydro, heterogeneous mass off of left kidney   Assessment 1.  Acute and/or chronic renal failure- only old lab  is creat 1.6 form 2008, he may have ckd, hard to tell; acute kidney injury likely hemodynamic from dehydration and acei and/or interstitial nephritis from legionella which is described; he is vol depleted still and will increase fluids, he is making urine, ua not real suspicious for GN, but will order some serologies with lack of good history. No need for hd at this time, warned pt that hd may be needed if renal function continues to deteriorate and questions were answered 2.  Pneumonia, urine antigen for legionella was + 3.  Hypertension- bp's soft, holding meds for now  Rec- avoid acei/arb, nsaid's, dye, increase IVF, urine na and creat, serologic studies and complements , will follow     Kelly Splinter MD  pager [phone]  cell [phone]  01/03/2013, 1:34 PM

## 2013-01-03 NOTE — Progress Notes (Signed)
CRITICAL VALUE ALERT  Critical value received:  Urine legonella positive  Date of notification:  01/03/13  Time of notification:  [id number]  Critical value read back:yes  Nurse who received alert:  Curt Bears RN  MD notified (1st page):  Rizwan  Time of first page:  [phone]  MD notified (2nd page):  Time of second page:  Responding MD:  Wynelle Cleveland  Time MD responded:  1344

## 2013-01-03 NOTE — Progress Notes (Signed)
Kathline Magic notified of continued febrile state with continued poor urinary output. Also made aware that patient stated that his speech had been slurred at times since yesterday during the day and that he made someone, he couldn't remember if it was MD or RN aware of this situation. This is the first time that I have noted speech being slurred at times. MAE x4 with generalized weakness. Equal bilateral grips, equal smile. No drift noted nor any difficulty at all swallowing liquids. Kathline Magic stated he would be up to see him.

## 2013-01-03 NOTE — Progress Notes (Signed)
[facility] progress note Chief complaint. Intermittent slurred speech. History of present illness. This 57 year old male admitted after being found down on the floor by his mother. Prior to this the patient had 3 days of diarrhea and cough. He was found to be febrile 105 in the emergency room.patient felt to have a severe sepsis secondary to community-acquired pneumonia, acute renal failure, gout, and hypertension. There's been no subsequent diarrhea since admission. Nursing noted the patient having intermittent slurred speech. When questioned about this the patient indicated that this started about the time of his admission. I did not converse with the patient and found that he does have a degree of dysarthria on an intermittent basis. Vital signs temperature 101.1, pulse 95, respiration 26, blood pressure 122/76. O2 sats 97%. General appearance. Well-developed male who is alert, cooperative, and in no distress. Heart. Rate and rhythm regular. No jugular venous distention or edema. Lungs. Breath sounds are reduced in the bases otherwise clear without distress. Neurologic. Cranial nerves 2-12 grossly intact. Uniformly weak in all 4 extremities but no unilateral or focal defects. Speech is intermittently slurred during conversation. Impression/plan. Problem #1. Intermittent dysarthria. Etiology here is unclear. Patient had a CT scan of the head on admission which was unremarkable. I've discussed the case with the neural hospitalist [facility] who has kindly consented to see the patient in consult. We'll follow for any recommendations by neurology.

## 2013-01-03 NOTE — Progress Notes (Signed)
[facility] Progress Note [place] TEAM 1 - Stepdown/ICU TEAM   Brian Lawson [id number] DOB: 05-29-1955 DOA: 01/01/2013 PCP: "[facility] " - per pt   Brief narrative: 57 y/o male who presented to ER after found on the floor by his mother. 3 days of diarrhea and cough- fever of 105 in the ER. Has a h/o Gout and HTN- has no h/o chronic renal failure as far as he knows. Last saw his doctor about 3 mo ago. Has no urinary complaints.   Assessment/Plan: Principal Problem:   Severe sepsis/  CAP- LLL - interestingly he is on room air now with pulse ox of 95% - changed antibiotics from vanc and Zosyn to Zithro and Rocephin to appropriately treat CAP -  urine strep neg - legionella pend -flu PCR neg - barely has a cough so unable to obtain sputum culture - f/u on blood cx   Active Problems: ARF - renal ultrasound- no obstruction but incidental mass- MRI ordered  - oliguric-  place foley to measure output - noted to be on ACE at home therefore may be ATN - cont IVF - change to Bicarb due to acidosis - worsening call neprho today     GOUT - cont Allopurinol    HYPERTENSION - BP low- hold ACE/ HCTZ combination  Thrombocytopenia - likely due to sepsis- follow  Diarrhea?  - none here so far - will d/c stool studies  Slurred speech  - neuro eval ordered in the middle of the night- much appreciated - MRI ordered  Code Status: full code Family Communication: none Disposition Plan: follow in SDU  Consultants: none  Procedures: none  Antibiotics: Vanc/ Zosyn 10/25- stopped today  DVT prophylaxis: SCDs  HPI/Subjective: Pt currently states he feels much better. No diarrhea- cough is mild- appetite is still poor but he is drinking fluids    Objective: Blood pressure 122/75, pulse 88, temperature 99.8 F (37.7 C), temperature source Oral, resp. rate 0, height 6\' 3"  (1.905 m), weight 97 kg (213 lb 13.5 oz), SpO2 96.00%.  Intake/Output  Summary (Last 24 hours) at 01/03/13 1140 Last data filed at 01/03/13 0800  Gross per 24 hour  Intake 3296.67 ml  Output    550 ml  Net 2746.67 ml     Exam: General: odd affect- appears to be detached- answering questions appropriates though- No acute respiratory distress Lungs: crackles in LLL Cardiovascular: Regular rate and rhythm without murmur gallop or rub normal S1 and S2 Abdomen: Nontender, nondistended, soft, bowel sounds positive, no rebound, no ascites, no appreciable mass Extremities: No significant cyanosis, clubbing, or edema bilateral lower extremities  Data Reviewed: Basic Metabolic Panel:  Recent Labs Lab 01/01/13 1859 01/02/13 0509 01/03/13 0450  NA 136 138 135  K 4.0 4.5 4.6  CL 99 104 100  CO2 22 20 18*  GLUCOSE 140* 122* 95  BUN 54* 59* 80*  CREATININE 3.93* 4.67* 6.49*  CALCIUM 9.2 8.5 7.8*   Liver Function Tests:  Recent Labs Lab 01/01/13 1859  AST 66*  ALT 29  ALKPHOS 60  BILITOT 0.9  PROT 8.4*  ALBUMIN 3.0*   No results found for this basename: LIPASE, AMYLASE,  in the last 168 hours No results found for this basename: AMMONIA,  in the last 168 hours CBC:  Recent Labs Lab 01/01/13 1859 01/02/13 0509 01/03/13 0450  WBC 12.0* 11.7* 12.1*  NEUTROABS 10.2*  --   --   HGB 15.4 14.9 13.2  HCT 45.7 43.6  38.8*  MCV 84.0 84.7 82.4  PLT 112* 100* 107*   Cardiac Enzymes: No results found for this basename: CKTOTAL, CKMB, CKMBINDEX, TROPONINI,  in the last 168 hours BNP (last 3 results) No results found for this basename: PROBNP,  in the last 8760 hours CBG: No results found for this basename: GLUCAP,  in the last 168 hours  Recent Results (from the past 240 hour(s))  CULTURE, BLOOD (ROUTINE X 2)     Status: None   Collection Time    01/01/13  8:30 PM      Result Value Range Status   Specimen Description BLOOD RIGHT ARM   Final   Special Requests BOTTLES DRAWN AEROBIC AND ANAEROBIC 10CC   Final   Culture  Setup Time     Final    Value: 01/02/2013 01:13     Performed at [facility]   Culture     Final   Value:        BLOOD CULTURE RECEIVED NO GROWTH TO DATE CULTURE WILL BE HELD FOR 5 DAYS BEFORE ISSUING A FINAL NEGATIVE REPORT     Performed at [facility]   Report Status PENDING   Incomplete  CULTURE, BLOOD (ROUTINE X 2)     Status: None   Collection Time    01/01/13  8:45 PM      Result Value Range Status   Specimen Description BLOOD RIGHT HAND   Final   Special Requests BOTTLES DRAWN AEROBIC AND ANAEROBIC 10CC   Final   Culture  Setup Time     Final   Value: 01/02/2013 01:41     Performed at [facility]   Culture     Final   Value:        BLOOD CULTURE RECEIVED NO GROWTH TO DATE CULTURE WILL BE HELD FOR 5 DAYS BEFORE ISSUING A FINAL NEGATIVE REPORT     Performed at [facility]   Report Status PENDING   Incomplete  MRSA PCR SCREENING     Status: None   Collection Time    01/01/13 10:25 PM      Result Value Range Status   MRSA by PCR NEGATIVE  NEGATIVE Final   Comment:            The GeneXpert MRSA Assay (FDA     approved for NASAL specimens     only), is one component of a     comprehensive MRSA colonization     surveillance program. It is not     intended to diagnose MRSA     infection nor to guide or     monitor treatment for     MRSA infections.     Studies:  Recent x-ray studies have been reviewed in detail by the Attending Physician  Scheduled Meds:  Scheduled Meds: . azithromycin  500 mg Intravenous Q24H  . cefTRIAXone (ROCEPHIN)  IV  1 g Intravenous Q24H  . enoxaparin (LOVENOX) injection  30 mg Subcutaneous Q24H  . feeding supplement (RESOURCE BREEZE)  1 Container Oral TID BM   Continuous Infusions: .  sodium bicarbonate  infusion 1000 mL 100 mL/hr at 01/03/13 0900    Time spent on care of this patient: 30 min   Debbe Odea, MD  [facility] Office  [phone] Pager - Text Page per Shea Evans as per below:  On-Call/Text Page:       [email or web address]      password [email or web address]  If 7PM-7AM, please contact night-coverage [email or web address] Password [email or web address]  01/03/2013, 11:40 AM   LOS: 2 days

## 2013-01-03 NOTE — Consult Note (Signed)
NEURO HOSPITALIST CONSULT NOTE    Reason for Consult: dysarthria  HPI:                                                                                                                                          Brian Lawson is an 57 y.o. male, right handed, with a past medical history significant for HTN, gout, admitted to the hospital after being found down on the floor by his mother. He reports a 3 days history of cough and diarrhea but denies prior episodes of alteration of consciousness. Work up in the hospital revealed sepsis and community-acquired pneumonia. Brian Lawson indicated that since yesterday he has been experiencing intermittent but very frustrating episodes of difficulty finding certain words and slurred speech which occurs " only with certain words". He denies difficulty with comprehension, HA, vertigo, double vision, difficulty swallowing, focal weakness or numbness, or language impairment. Remains febrile. CT brain upon admission revealed no acute abnormality.  Past Medical History  Diagnosis Date  . Hypertension   . Gout     Past Surgical History  Procedure Laterality Date  . Hernia repair      History reviewed. No pertinent family history.   Social History:  reports that he has quit smoking. He has never used smokeless tobacco. He reports that he drinks alcohol. He reports that he does not use illicit drugs.  No Known Allergies  MEDICATIONS:                                                                                                                     I have reviewed the patient's current medications.   ROS:  History obtained from the patient and cahrt review.  General ROS: significant for fever but denies chills weight gain or weight loss Psychological ROS: negative for - behavioral disorder,  hallucinations, memory difficulties, mood swings or suicidal ideation Ophthalmic ROS: negative for - blurry vision, double vision, eye pain or loss of vision ENT ROS: negative for - epistaxis, nasal discharge, oral lesions, sore throat, tinnitus or vertigo Allergy and Immunology ROS: negative for - hives or itchy/watery eyes Hematological and Lymphatic ROS: negative for - bleeding problems, bruising or swollen lymph nodes Endocrine ROS: negative for - galactorrhea, hair pattern changes, polydipsia/polyuria or temperature intolerance Respiratory ROS: negative for - cough, hemoptysis, shortness of breath or wheezing Cardiovascular ROS: negative for - chest pain, dyspnea on exertion, edema or irregular heartbeat Gastrointestinal ROS: negative for - abdominal pain, diarrhea, hematemesis, nausea/vomiting or stool incontinence Genito-Urinary ROS: negative for - dysuria, hematuria, incontinence or urinary frequency/urgency Musculoskeletal ROS: negative for - joint swelling or muscular weakness Neurological ROS: as noted in HPI Dermatological ROS: negative for rash and skin lesion changes   Physical exam: pleasant male in no apparent distress. Blood pressure 118/70, pulse 102, temperature 101.9 F (38.8 C), temperature source Oral, resp. rate 29, height 6\' 3"  (1.905 m), weight 97 kg (213 lb 13.5 oz), SpO2 96.00%. Head: normocephalic. Neck: supple, no bruits, no JVD. Cardiac: no murmurs. Lungs: clear. Abdomen: soft, no tender, no mass. Extremities: no edema. Congenital deformity left foot   Neurologic Examination:                                                                                                      Mental Status: Alert, awake, oriented x 4, thought content appropriate. Mild dysarthria.  Able to follow 3 step commands without difficulty. Cranial Nerves: II: Discs flat bilaterally; Visual fields grossly normal, pupils equal, round, reactive to light and accommodation III,IV, VI:  ptosis not present, extra-ocular motions intact bilaterally V,VII: smile symmetric, facial light touch sensation normal bilaterally VIII: hearing normal bilaterally IX,X: gag reflex present XI: bilateral shoulder shrug XII: midline tongue extension without atrophy or fasciculations  Motor: Right : Upper extremity   5/5    Left:     Upper extremity   5/5  Lower extremity   5/5     Lower extremity   5/5 Tone and bulk:normal tone throughout; no atrophy noted Sensory: Pinprick and light touch intact throughout, bilaterally Deep Tendon Reflexes:  Right: Upper Extremity   Left: Upper extremity   biceps (C-5 to C-6) 2/4   biceps (C-5 to C-6) 2/4 tricep (C7) 2/4    triceps (C7) 2/4 Brachioradialis (C6) 2/4  Brachioradialis (C6) 2/4  Lower Extremity Lower Extremity  quadriceps (L-2 to L-4) 2/4   quadriceps (L-2 to L-4) 2/4 Achilles (S1) 2/4   Achilles (S1) 2/4  Plantars: Right: downgoing   Left: downgoing Cerebellar: normal finger-to-nose,  normal heel-to-shin test Gait:  No tested. CV: pulses palpable throughout    No results found for this basename: cbc, bmp, coags, chol, tri, ldl, hga1c    Results for orders placed during the hospital  encounter of 01/01/13 (from the past 48 hour(s))  CBC WITH DIFFERENTIAL     Status: Abnormal   Collection Time    01/01/13  6:59 PM      Result Value Range   WBC 12.0 (*) 4.0 - 10.5 K/uL   RBC 5.44  4.22 - 5.81 MIL/uL   Hemoglobin 15.4  13.0 - 17.0 g/dL   HCT 45.7  39.0 - 52.0 %   MCV 84.0  78.0 - 100.0 fL   MCH 28.3  26.0 - 34.0 pg   MCHC 33.7  30.0 - 36.0 g/dL   RDW 16.0 (*) 11.5 - 15.5 %   Platelets 112 (*) 150 - 400 K/uL   Comment: PLATELET COUNT CONFIRMED BY SMEAR     REPEATED TO VERIFY     SPECIMEN CHECKED FOR CLOTS   Neutrophils Relative % 85 (*) 43 - 77 %   Neutro Abs 10.2 (*) 1.7 - 7.7 K/uL   Lymphocytes Relative 8 (*) 12 - 46 %   Lymphs Abs 1.0  0.7 - 4.0 K/uL   Monocytes Relative 6  3 - 12 %   Monocytes Absolute 0.7  0.1 -  1.0 K/uL   Eosinophils Relative 0  0 - 5 %   Eosinophils Absolute 0.0  0.0 - 0.7 K/uL   Basophils Relative 0  0 - 1 %   Basophils Absolute 0.0  0.0 - 0.1 K/uL  COMPREHENSIVE METABOLIC PANEL     Status: Abnormal   Collection Time    01/01/13  6:59 PM      Result Value Range   Sodium 136  135 - 145 mEq/L   Potassium 4.0  3.5 - 5.1 mEq/L   Chloride 99  96 - 112 mEq/L   CO2 22  19 - 32 mEq/L   Glucose, Bld 140 (*) 70 - 99 mg/dL   BUN 54 (*) 6 - 23 mg/dL   Creatinine, Ser 3.93 (*) 0.50 - 1.35 mg/dL   Calcium 9.2  8.4 - 10.5 mg/dL   Total Protein 8.4 (*) 6.0 - 8.3 g/dL   Albumin 3.0 (*) 3.5 - 5.2 g/dL   AST 66 (*) 0 - 37 U/L   ALT 29  0 - 53 U/L   Alkaline Phosphatase 60  39 - 117 U/L   Total Bilirubin 0.9  0.3 - 1.2 mg/dL   GFR calc non Af Amer 15 (*) >90 mL/min   GFR calc Af Amer 18 (*) >90 mL/min   Comment: (NOTE)     The eGFR has been calculated using the CKD EPI equation.     This calculation has not been validated in all clinical situations.     eGFR's persistently <90 mL/min signify possible Chronic Kidney     Disease.  LACTIC ACID, PLASMA     Status: Abnormal   Collection Time    01/01/13  6:59 PM      Result Value Range   Lactic Acid, Venous 2.4 (*) 0.5 - 2.2 mmol/L  SALICYLATE LEVEL     Status: Abnormal   Collection Time    01/01/13  6:59 PM      Result Value Range   Salicylate Lvl 123456 (*) 2.8 - 20.0 mg/dL  URINALYSIS, ROUTINE W REFLEX MICROSCOPIC     Status: Abnormal   Collection Time    01/01/13  8:01 PM      Result Value Range   Color, Urine YELLOW  YELLOW   APPearance CLOUDY (*) CLEAR   Specific Gravity, Urine  1.017  1.005 - 1.030   pH 5.5  5.0 - 8.0   Glucose, UA NEGATIVE  NEGATIVE mg/dL   Hgb urine dipstick LARGE (*) NEGATIVE   Bilirubin Urine NEGATIVE  NEGATIVE   Ketones, ur NEGATIVE  NEGATIVE mg/dL   Protein, ur 100 (*) NEGATIVE mg/dL   Urobilinogen, UA 1.0  0.0 - 1.0 mg/dL   Nitrite NEGATIVE  NEGATIVE   Leukocytes, UA NEGATIVE  NEGATIVE  URINE  MICROSCOPIC-ADD ON     Status: Abnormal   Collection Time    01/01/13  8:01 PM      Result Value Range   Squamous Epithelial / LPF RARE  RARE   WBC, UA 0-2  <3 WBC/hpf   RBC / HPF 3-6  <3 RBC/hpf   Bacteria, UA MANY (*) RARE   Urine-Other AMORPHOUS URATES/PHOSPHATES    MRSA PCR SCREENING     Status: None   Collection Time    01/01/13 10:25 PM      Result Value Range   MRSA by PCR NEGATIVE  NEGATIVE   Comment:            The GeneXpert MRSA Assay (FDA     approved for NASAL specimens     only), is one component of a     comprehensive MRSA colonization     surveillance program. It is not     intended to diagnose MRSA     infection nor to guide or     monitor treatment for     MRSA infections.  BASIC METABOLIC PANEL     Status: Abnormal   Collection Time    01/02/13  5:09 AM      Result Value Range   Sodium 138  135 - 145 mEq/L   Potassium 4.5  3.5 - 5.1 mEq/L   Chloride 104  96 - 112 mEq/L   CO2 20  19 - 32 mEq/L   Glucose, Bld 122 (*) 70 - 99 mg/dL   BUN 59 (*) 6 - 23 mg/dL   Creatinine, Ser 4.67 (*) 0.50 - 1.35 mg/dL   Calcium 8.5  8.4 - 10.5 mg/dL   GFR calc non Af Amer 13 (*) >90 mL/min   GFR calc Af Amer 15 (*) >90 mL/min   Comment: (NOTE)     The eGFR has been calculated using the CKD EPI equation.     This calculation has not been validated in all clinical situations.     eGFR's persistently <90 mL/min signify possible Chronic Kidney     Disease.  CBC     Status: Abnormal   Collection Time    01/02/13  5:09 AM      Result Value Range   WBC 11.7 (*) 4.0 - 10.5 K/uL   RBC 5.15  4.22 - 5.81 MIL/uL   Hemoglobin 14.9  13.0 - 17.0 g/dL   HCT 43.6  39.0 - 52.0 %   MCV 84.7  78.0 - 100.0 fL   MCH 28.9  26.0 - 34.0 pg   MCHC 34.2  30.0 - 36.0 g/dL   RDW 16.1 (*) 11.5 - 15.5 %   Platelets 100 (*) 150 - 400 K/uL   Comment: CONSISTENT WITH PREVIOUS RESULT  PROCALCITONIN     Status: None   Collection Time    01/02/13 11:50 AM      Result Value Range   Procalcitonin  41.39     Comment:            Interpretation:  PCT >= 10 ng/mL:     Important systemic inflammatory response,     almost exclusively due to severe bacterial     sepsis or septic shock.     (NOTE)             ICU PCT Algorithm               Non ICU PCT Algorithm        ----------------------------     ------------------------------             PCT < 0.25 ng/mL                 PCT < 0.1 ng/mL         Stopping of antibiotics            Stopping of antibiotics           strongly encouraged.               strongly encouraged.        ----------------------------     ------------------------------           PCT level decrease by               PCT < 0.25 ng/mL           >= 80% from peak PCT           OR PCT 0.25 - 0.5 ng/mL          Stopping of antibiotics                                                 encouraged.         Stopping of antibiotics               encouraged.        ----------------------------     ------------------------------           PCT level decrease by              PCT >= 0.25 ng/mL           < 80% from peak PCT            AND PCT >= 0.5 ng/mL            Continuing antibiotics                                                  encouraged.           Continuing antibiotics                encouraged.        ----------------------------     ------------------------------         PCT level increase compared          PCT > 0.5 ng/mL             with peak PCT AND              PCT >= 0.5 ng/mL             Escalation of antibiotics  strongly encouraged.          Escalation of antibiotics            strongly encouraged.  STREP PNEUMONIAE URINARY ANTIGEN     Status: None   Collection Time    01/02/13  2:00 PM      Result Value Range   Strep Pneumo Urinary Antigen NEGATIVE  NEGATIVE   Comment:            Infection due to S. pneumoniae     cannot be absolutely ruled out     since the antigen present     may be below the detection  limit     of the test.    Ct Head Wo Contrast  01/01/2013   CLINICAL DATA:  Loss of consciousness  EXAM: CT HEAD WITHOUT CONTRAST  TECHNIQUE: Contiguous axial images were obtained from the base of the skull through the vertex without intravenous contrast.  COMPARISON:  01/13/2007  FINDINGS: No skull fracture is noted. Paranasal sinuses and mastoid air cells are unremarkable. No intracranial hemorrhage, mass effect or midline shift. Stable patchy subcortical white matter decreased attenuation consistent with chronic small vessel ischemic changes. No definite acute cortical infarction. No mass lesion is noted on this unenhanced scan. Ventricular size is stable from prior exam.  IMPRESSION: No acute intracranial abnormality. Stable patchy subcortical white matter decreased attenuation consistent with chronic small vessel ischemic changes.   Electronically Signed   By: Lahoma Crocker M.D.   On: 01/01/2013 19:39   US Renal  01/02/2013   CLINICAL DATA:  Renal failure.  EXAM: RENAL/URINARY TRACT ULTRASOUND COMPLETE  COMPARISON:  None.  FINDINGS: Right Kidney  Length: 10.0 cm. The right kidney demonstrates increased cortical echogenicity without significant atrophy or no evidence of hydronephrosis. No focal lesions are identified. There is no evidence of hydronephrosis.  Left Kidney  Length: 10.9 cm. Heterogeneous mass emanates from the lateral aspect of the mid to lower left kidney. This mass appears solid but does not demonstrate a great deal of internal blood flow. This mass-like area measures approximately 8.8 x 8.8 x 7.8 cm. The kidney itself shows no evidence of hydronephrosis.  Bladder  Appears normal for degree of bladder distention.  IMPRESSION: No evidence of hydronephrosis. Nearly 9 cm mass-like area of the left kidney which does not demonstrate significant internal blood flow. This is likely a neoplasm but could potentially also represent a complicated cyst. In the setting of renal failure, further  evaluation would be helpful with unenhanced MRI of the abdomen.   Electronically Signed   By: Aletta Edouard M.D.   On: 01/02/2013 15:06   Dg Chest Port 1 View  01/01/2013   CLINICAL DATA:  Fever.  EXAM: PORTABLE CHEST - 1 VIEW  COMPARISON:  None.  FINDINGS: Cardiopericardial silhouette and mediastinal contours appear within normal limits. There is patchy density in the left base favored to represent airspace disease/ pneumonia over atelectasis. This likely resides within the left lower lobe. Increased retrocardiac density is present. Monitoring leads project over the chest. Followup to ensure radiographic clearing and exclude an underlying lesion is recommended. Typically clearing will be observed at 8 weeks.  IMPRESSION: Left lower lobe patchy airspace disease compatible with pneumonia.   Electronically Signed   By: Dereck Ligas M.D.   On: 01/01/2013 19:07     Assessment/Plan: Pleasant 57 years old with HTN, admitted with sepsis and CAP who has been experiencing intermittent, isolated dysarthria since day of admission. Neuro-exam is  otherwise non focal except for mild, off and on dysarthria. CT brain unremarkable. He has a clear sensorium and thus can not attribute his intermittent dysarthria to a septic encephalopathy. Will consider a cerebrovascular insult and thus I am requesting MRI-DWI brain. Will follow up after testing.   Dorian Pod, MD [facility] [phone]  01/03/2013, 3:55 AM

## 2013-01-03 NOTE — Procedures (Addendum)
Central Venous Catheter Insertion Procedure Note Kaimi Pfisterer [id number] 07-20-1955  Procedure: Insertion of Central Venous Catheter Indications: Drug and/or fluid administration  Procedure Details Consent: Risks of procedure as well as the alternatives and risks of each were explained to the (patient/caregiver).  Consent for procedure obtained. Consent obtained from patient's mother as unclear if patient has capacity to consent to invasive procedure.   Time Out: Verified patient identification, verified procedure, site/side was marked, verified correct patient position, special equipment/implants available, medications/allergies/relevent history reviewed, required imaging and test results available.  Performed  Maximum sterile technique was used including antiseptics, cap, gloves, gown, hand hygiene, mask and sheet. Skin prep: Chlorhexidine; local anesthetic administered A antimicrobial bonded/coated triple lumen catheter was placed in the right internal jugular vein using the Seldinger technique.  Evaluation Blood flow good Complications: No apparent complications Patient did tolerate procedure well. Chest X-ray ordered to verify placement.  CXR: pending.  Laron Boorman R. 01/03/2013, 9:48 PM  I used ultrasound to locate and access the vein/artery.

## 2013-01-04 ENCOUNTER — Inpatient Hospital Stay (HOSPITAL_COMMUNITY): Payer: Medicaid Other

## 2013-01-04 DIAGNOSIS — N2889 Other specified disorders of kidney and ureter: Secondary | ICD-10-CM | POA: Diagnosis present

## 2013-01-04 DIAGNOSIS — R479 Unspecified speech disturbances: Secondary | ICD-10-CM | POA: Diagnosis not present

## 2013-01-04 DIAGNOSIS — E872 Acidosis, unspecified: Secondary | ICD-10-CM | POA: Diagnosis present

## 2013-01-04 DIAGNOSIS — G9341 Metabolic encephalopathy: Secondary | ICD-10-CM | POA: Diagnosis not present

## 2013-01-04 LAB — CBC
HCT: 33.5 % — ABNORMAL LOW (ref 39.0–52.0)
Hemoglobin: 11.6 g/dL — ABNORMAL LOW (ref 13.0–17.0)
MCH: 28.4 pg (ref 26.0–34.0)
MCHC: 34.6 g/dL (ref 30.0–36.0)
MCV: 81.9 fL (ref 78.0–100.0)
RDW: 16.6 % — ABNORMAL HIGH (ref 11.5–15.5)

## 2013-01-04 LAB — BASIC METABOLIC PANEL
BUN: 88 mg/dL — ABNORMAL HIGH (ref 6–23)
CO2: 18 mEq/L — ABNORMAL LOW (ref 19–32)
Calcium: 7.3 mg/dL — ABNORMAL LOW (ref 8.4–10.5)
Creatinine, Ser: 7.61 mg/dL — ABNORMAL HIGH (ref 0.50–1.35)
GFR calc Af Amer: 8 mL/min — ABNORMAL LOW (ref >90)
GFR calc non Af Amer: 7 mL/min — ABNORMAL LOW (ref >90)
Glucose, Bld: 123 mg/dL — ABNORMAL HIGH (ref 70–99)

## 2013-01-04 LAB — PROTIME-INR: Prothrombin Time: 14.7 seconds (ref 11.6–15.2)

## 2013-01-04 LAB — HEPATITIS PANEL, ACUTE
HCV Ab: NEGATIVE
Hep A IgM: NONREACTIVE
Hep B C IgM: NONREACTIVE
Hepatitis B Surface Ag: NEGATIVE

## 2013-01-04 LAB — HAPTOGLOBIN: Haptoglobin: 291 mg/dL — ABNORMAL HIGH (ref 45–215)

## 2013-01-04 MED ORDER — SODIUM CHLORIDE 0.9 % IV SOLN
INTRAVENOUS | Status: DC
  Administered 2013-01-04: 20 mL/h via INTRAVENOUS
  Administered 2013-01-06 – 2013-01-07 (×2): via INTRAVENOUS

## 2013-01-04 MED ORDER — LEVOFLOXACIN IN D5W 750 MG/150ML IV SOLN
750.0000 mg | Freq: Once | INTRAVENOUS | Status: AC
  Administered 2013-01-04: 750 mg via INTRAVENOUS
  Filled 2013-01-04: qty 150

## 2013-01-04 MED ORDER — SODIUM CHLORIDE 0.9 % IV BOLUS (SEPSIS)
3000.0000 mL | Freq: Once | INTRAVENOUS | Status: AC
  Administered 2013-01-04 (×3): via INTRAVENOUS

## 2013-01-04 MED ORDER — LEVOFLOXACIN IN D5W 750 MG/150ML IV SOLN
750.0000 mg | INTRAVENOUS | Status: DC
  Filled 2013-01-04: qty 150

## 2013-01-04 MED ORDER — HEPARIN SODIUM (PORCINE) 5000 UNIT/ML IJ SOLN
5000.0000 [IU] | Freq: Three times a day (TID) | INTRAMUSCULAR | Status: DC
  Administered 2013-01-04 – 2013-01-25 (×58): 5000 [IU] via SUBCUTANEOUS
  Filled 2013-01-04 (×65): qty 1

## 2013-01-04 MED ORDER — SODIUM BICARBONATE 650 MG PO TABS
650.0000 mg | ORAL_TABLET | Freq: Two times a day (BID) | ORAL | Status: DC
  Administered 2013-01-04 – 2013-01-25 (×42): 650 mg via ORAL
  Filled 2013-01-04 (×45): qty 1

## 2013-01-04 MED ORDER — LEVOFLOXACIN IN D5W 500 MG/100ML IV SOLN
500.0000 mg | INTRAVENOUS | Status: DC
  Administered 2013-01-06: 500 mg via INTRAVENOUS
  Filled 2013-01-04: qty 100

## 2013-01-04 NOTE — Progress Notes (Addendum)
[facility] Progress Note [place] TEAM 1 - Stepdown/ICU TEAM   Brian Lawson [id number] DOB: [birth year] DOA: 01/01/2013 PCP: "[facility] " - per pt   Brief narrative: 57 y/o male who presented to ER after found on the floor by his mother. 3 days of diarrhea and cough- fever of 105 in the ER. Has a h/o Gout and HTN- has no h/o chronic renal failure as far as he knows. Last saw his doctor about 3 mo ago. Has no urinary complaints.   Assessment/Plan:  Sepsis due to PNA -supportive care -Legionella PNA and associated dehydration and ARF  Acute hypoxic respiratory failure due to Legionella PNA - changed antibiotics from vanc and Zosyn to Zithro and Rocephin to appropriately treat CAP but since Legionella positive will change to Levaquin (per UptoDate: fluoroquinolones decrease fever better and faster and also shorten hospital stays) - Flu PCR negative - f/u on blood cx   ARF/metabolic acidosis - renal ultrasound- no obstruction but incidental mass- MRI confirms  - oliguric-  place foley to measure output - noted to be on ACE at home therefore may be ATN - Nephro following- felt 2/2 ATN so hydration continued and bicarb gtt started 10/27 but as of today renal function has worsened and concerned he may need HD this admit. 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 -consult Urology/Dr. Jeffie Pollock  Slurred speech/metabolic encephalopathy  - neuro eval much appreciated - MRI head without acute changes -EEG pending -likely due to progressive azotemia    GOUT - cont Allopurinol    HYPERTENSION - BP low - hold ACE/ HCTZ combination  Thrombocytopenia - likely due to sepsis - follow  Diarrhea due to legionella - none here so far - will d/c stool studies   Code Status: FULL Family Communication: no family present at time of exam Disposition Plan:  SDU  Consultants: Nephrology Urology Neurology  Procedures: EEG pending  Antibiotics: Vanc/ Zosyn 10/25 >>> 10/26 Levaquin 10/27 >>> Zithromax 10/25 >>> 10/27 Rocephin 10/25 >>> 10/26  DVT prophylaxis: SQ heparin  HPI/Subjective: Pt without complaints but per RN more lethargic and moans regularly without any localized pain.  Objective: Blood pressure 150/92, pulse 92, temperature 99.6 F (37.6 C), temperature source Oral, resp. rate 28, height 6\' 3"  (1.905 m), weight 97 kg (213 lb 13.5 oz), SpO2 99.00%.  Intake/Output Summary (Last 24 hours) at 01/04/13 1115 Last data filed at 01/04/13 0700  Gross per 24 hour  Intake   5267 ml  Output    910 ml  Net   4357 ml    Exam: General: Flat affect- answering questions appropriately - No acute respiratory distress Lungs: crackles in LLL, 2L Cardiovascular: Regular rate and rhythm without murmur gallop or rub normal S1 and S2 Abdomen: Nontender, nondistended, soft, bowel sounds positive, no rebound, no ascites, no appreciable mass Extremities: No significant cyanosis, clubbing, or edema bilateral lower extremities  Data Reviewed: Basic Metabolic Panel:  Recent Labs Lab 01/01/13 1859 01/02/13 0509 01/03/13 0450 01/03/13 2330  NA 136 138 135 136  K 4.0 4.5 4.6 4.2  CL 99 104 100 101  CO2 22 20 18* 18*  GLUCOSE 140* 122* 95 123*  BUN 54* 59* 80* 88*  CREATININE 3.93* 4.67* 6.49* 7.61*  CALCIUM 9.2 8.5 7.8* 7.3*   Liver Function Tests:  Recent Labs Lab 01/01/13 1859  AST 66*  ALT 29  ALKPHOS 60  BILITOT 0.9  PROT 8.4*  ALBUMIN 3.0*   No results found for this basename: LIPASE, AMYLASE,  in the last 168 hours No results found for this basename: AMMONIA,  in the last 168 hours  CBC:  Recent Labs Lab 01/01/13 1859 01/02/13 0509 01/03/13 0450 01/04/13 0530  WBC 12.0* 11.7* 12.1* 10.0  NEUTROABS 10.2*  --   --   --   HGB 15.4 14.9 13.2 11.6*  HCT 45.7 43.6 38.8* 33.5*  MCV 84.0 84.7 82.4 81.9  PLT  112* 100* 107* 123*   Cardiac Enzymes:  Recent Labs Lab 01/03/13 2330 01/04/13 0530  CKTOTAL 637* 526*    Recent Results (from the past 240 hour(s))  CULTURE, BLOOD (ROUTINE X 2)     Status: None   Collection Time    01/01/13  8:30 PM      Result Value Range Status   Specimen Description BLOOD RIGHT ARM   Final   Special Requests BOTTLES DRAWN AEROBIC AND ANAEROBIC 10CC   Final   Culture  Setup Time     Final   Value: 01/02/2013 01:13     Performed at [facility]   Culture     Final   Value:        BLOOD CULTURE RECEIVED NO GROWTH TO DATE CULTURE WILL BE HELD FOR 5 DAYS BEFORE ISSUING A FINAL NEGATIVE REPORT     Performed at [facility]   Report Status PENDING   Incomplete  CULTURE, BLOOD (ROUTINE X 2)     Status: None   Collection Time    01/01/13  8:45 PM      Result Value Range Status   Specimen Description BLOOD RIGHT HAND   Final   Special Requests BOTTLES DRAWN AEROBIC AND ANAEROBIC 10CC   Final   Culture  Setup Time     Final   Value: 01/02/2013 01:41     Performed at [facility]   Culture     Final   Value:        BLOOD CULTURE RECEIVED NO GROWTH TO DATE CULTURE WILL BE HELD FOR 5 DAYS BEFORE ISSUING A FINAL NEGATIVE REPORT     Performed at [facility]   Report Status PENDING   Incomplete  MRSA PCR SCREENING     Status: None   Collection Time    01/01/13 10:25 PM      Result Value Range Status   MRSA by PCR NEGATIVE  NEGATIVE Final   Comment:            The GeneXpert MRSA Assay (FDA     approved for NASAL specimens     only), is one component of a     comprehensive MRSA colonization     surveillance program. It is not     intended to diagnose MRSA     infection nor to guide or     monitor treatment for     MRSA infections.     Studies:  Recent x-ray studies have been reviewed in detail by the Attending Physician  Scheduled Meds:  Scheduled Meds: . enoxaparin (LOVENOX) injection  30 mg Subcutaneous Q24H  .  feeding supplement (RESOURCE BREEZE)  1 Container Oral TID BM  . [START ON 01/06/2013] levofloxacin (LEVAQUIN) IV  500 mg Intravenous Q48H  . levofloxacin (LEVAQUIN) IV  750 mg Intravenous Once  . sodium bicarbonate  650 mg Oral BID   Time spent on care of this patient: 35 min  Samella Parr., ANP  [facility] Office  [phone] Pager - Text Page per Shea Evans as per below:  On-Call/Text Page:      [email or web address]      password [email or web address]  If 7PM-7AM, please contact night-coverage [email or web address] Password [email or web address] 01/04/2013, 11:15 AM   LOS: 3 days   I have personally examined this patient and reviewed the entire database. I have reviewed the above note, made any necessary editorial changes, and agree with its content.  Cherene Altes, MD [facility]

## 2013-01-04 NOTE — Progress Notes (Signed)
Assessment  1. Acute and/or chronic renal failure- only old lab is creat 1.6 form 2008 2  Renal cystic mass, left   3. Pneumonia, urine antigen for legionella was +  4. Hypertension- meds may need to be resumed soon  Rec-  reduce IVF, f/u urine na and creat, serologic studies and complements ; will need urology to evaluate; will decide about need for dialysis in next day or so. He may now need a nephrectomy and that may change things as far as long term need for HD. Change bicarb to PO.  Subjective: Interval History: Recent nausea and anorexia  Objective: Vital signs in last 24 hours: Temp:  [98.8 F (37.1 C)-103.1 F (39.5 C)] 99.6 F (37.6 C) (10/27 0747) Pulse Rate:  [88-115] 96 (10/27 0747) Resp:  [0-32] 24 (10/27 0747) BP: (115-151)/(75-117) 151/92 mmHg (10/27 0747) SpO2:  [89 %-100 %] 99 % (10/27 0747) Weight change:   Intake/Output from previous day: 10/26 0701 - 10/27 0700 In: 5567 [P.O.:120; I.V.:3147; IV Piggyback:2300] Out: 910 [Urine:910] Intake/Output this shift:    General appearance: alert and cooperative Resp: clear to auscultation bilaterally Chest wall: no tenderness Cardio: regular rate and rhythm, S1, S2 normal, no murmur, click, rub or gallop Extremities: extremities normal, atraumatic, no cyanosis or edema  Lab Results:  Recent Labs  01/03/13 0450 01/04/13 0530  WBC 12.1* 10.0  HGB 13.2 11.6*  HCT 38.8* 33.5*  PLT 107* 123*   BMET:  Recent Labs  01/03/13 0450 01/03/13 2330  NA 135 136  K 4.6 4.2  CL 100 101  CO2 18* 18*  GLUCOSE 95 123*  BUN 80* 88*  CREATININE 6.49* 7.61*  CALCIUM 7.8* 7.3*   No results found for this basename: PTH,  in the last 72 hours Iron Studies: No results found for this basename: IRON, TIBC, TRANSFERRIN, FERRITIN,  in the last 72 hours Studies/Results: Mr Brain Wo Contrast  01/03/2013   *RADIOLOGY REPORT*  Clinical Data: Found down. Loss of consciousness.  MRI HEAD WITHOUT CONTRAST  Technique:   Multiplanar, multiecho pulse sequences of the brain and surrounding structures were obtained according to standard protocol without intravenous contrast.  Comparison: CT head without contrast 01/01/2013.  Findings: Scattered periventricular and subcortical T2 hyperintensities are present bilaterally.  No acute infarct, hemorrhage, mass lesion is present.  The ventricles are normal size.  No significant extra-axial fluid collection is present.  Flow is present in the major intracranial arteries.  The globes orbits are intact.  Mild mucosal thickening is most evident in the sphenoid sinuses and ethmoid air cells.  There is some fluid in the mastoid air cells bilaterally.  No obstructing nasopharyngeal lesion is evident.  IMPRESSION:  1.  No acute intracranial abnormality. 2.  Diffuse white matter changes are markedly advanced for age. The finding is nonspecific but can be seen in the setting of chronic microvascular ischemia, a demyelinating process such as multiple sclerosis, vasculitis, complicated migraine headaches, or as the sequelae of a prior infectious or inflammatory process.   Original Report Authenticated By: San Morelle, M.D.   Mr Abdomen Wo Contrast  01/04/2013   CLINICAL DATA:  Left renal mass. No IV contrast was administered due to chronic renal insufficiency  EXAM: MRI ABDOMEN WITHOUT CONTRAST  TECHNIQUE: Multiplanar, multisequence MR imaging was performed. No intravenous contrast was administered.  COMPARISON:  Ultrasound 01/02/2013  FINDINGS: There is a large rounded mass extending laterally from the mid and lower pole of the left kidney. The mass measures 8.4 x 8.9  x 9.5 cm. The mass pass encapsulating 7 mm rim on the T2 weighted images (series 4). There is extensive nodularity extending centrally from this capsulated margin. There is some high T2 signal centrally suggesting some cystic change or hemorrhage. No evidence of macroscopic fat within the lesion on the fat-suppressed T1  sequences.  The mass lesion is removed from the hilum without gross involvement of the renal vein on this noncontrast exam. The right kidney appears normal. Adrenal glands normal. No retroperitoneal adenopathy.  The liver, gallbladder, pancreas, spleen are normal. EXAM is this degraded by patient respiratory motion.  IMPRESSION: 1. 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  2. No evidence of metastasis or involvement of the renal vein this non contrast exam. If the patient is a chronic dialysis patient, a CT with IV contrast could be performed.   Electronically Signed   By: Suzy Bouchard M.D.   On: 01/04/2013 08:15   US Renal  01/02/2013   CLINICAL DATA:  Renal failure.  EXAM: RENAL/URINARY TRACT ULTRASOUND COMPLETE  COMPARISON:  None.  FINDINGS: Right Kidney  Length: 10.0 cm. The right kidney demonstrates increased cortical echogenicity without significant atrophy or no evidence of hydronephrosis. No focal lesions are identified. There is no evidence of hydronephrosis.  Left Kidney  Length: 10.9 cm. Heterogeneous mass emanates from the lateral aspect of the mid to lower left kidney. This mass appears solid but does not demonstrate a great deal of internal blood flow. This mass-like area measures approximately 8.8 x 8.8 x 7.8 cm. The kidney itself shows no evidence of hydronephrosis.  Bladder  Appears normal for degree of bladder distention.  IMPRESSION: No evidence of hydronephrosis. Nearly 9 cm mass-like area of the left kidney which does not demonstrate significant internal blood flow. This is likely a neoplasm but could potentially also represent a complicated cyst. In the setting of renal failure, further evaluation would be helpful with unenhanced MRI of the abdomen.   Electronically Signed   By: Aletta Edouard M.D.   On: 01/02/2013 15:06   Dg Chest Port 1 View  01/03/2013   CLINICAL DATA:  Central line placement.  EXAM: PORTABLE CHEST - 1 VIEW   COMPARISON:  Earlier film of the same day  FINDINGS: Interval placement of a right IJ central line, tip near the cavoatrial junction. No pneumothorax. Persistent left lower lung consolidation/ atelectasis. Right lung clear. Heart size upper limits normal. Regional bones unremarkable.  IMPRESSION: 1. Central line placement to cavoatrial junction without pneumothorax.   Electronically Signed   By: Arne Cleveland M.D.   On: 01/03/2013 22:35   Dg Chest Port 1 View  01/03/2013   CLINICAL DATA:  Cough, pneumonia  EXAM: PORTABLE CHEST - 1 VIEW  COMPARISON:  01/01/2013  FINDINGS: Heart size and vascular pattern normal. Right lung is clear. Extensive hazy opacity throughout the left lower lobe. This is significantly increased when compared to the prior study.  IMPRESSION: Extensive hazy left lower lobe airspace opacification, significantly worse when compared to the prior study.   Electronically Signed   By: Skipper Cliche M.D.   On: 01/03/2013 07:48    Scheduled: . enoxaparin (LOVENOX) injection  30 mg Subcutaneous Q24H  . feeding supplement (RESOURCE BREEZE)  1 Container Oral TID BM  . levofloxacin (LEVAQUIN) IV  750 mg Intravenous Q24H    LOS: 3 days   Lorrayne Ismael C 01/04/2013,10:32 AM

## 2013-01-04 NOTE — Procedures (Signed)
ELECTROENCEPHALOGRAM REPORT   Patient: Brian Lawson       Room #: [id number] EEG No. ID: [id number] Age: 57 y.o.        Sex: male Referring Physician: Thereasa Solo Report Date:  01/04/2013        Interpreting Physician: Alexis Goodell D  History: [name] is an 57 y.o. male with intermittent difficulty with speech  Medications:  Scheduled: . enoxaparin (LOVENOX) injection  30 mg Subcutaneous Q24H  . feeding supplement (RESOURCE BREEZE)  1 Container Oral TID BM  . [START ON 01/06/2013] levofloxacin (LEVAQUIN) IV  500 mg Intravenous Q48H  . levofloxacin (LEVAQUIN) IV  750 mg Intravenous Once  . sodium bicarbonate  650 mg Oral BID    Conditions of Recording:  This is a 16 channel EEG carried out with the patient in the awake and drowsy states.  Description:  The waking background activity consists of a low voltage, symmetrical, fairly well organized, 8-9 Hz alpha activity, seen from the parieto-occipital and posterior temporal regions.  Low voltage fast activity, poorly organized, is seen anteriorly and is at times superimposed on more posterior regions.  A mixture of theta and alpha rhythms are seen from the central and temporal regions. The patient drowses with slowing to irregular, low voltage theta and beta activity.   Stage II sleep is not obtained. Hyperventilation and intermittent photic stimulation were not performed.  IMPRESSION: This is a normal electroencephalogram   Alexis Goodell, MD [facility] [phone] 01/04/2013, 3:16 PM

## 2013-01-04 NOTE — Consult Note (Signed)
Urology Consult   Physician requesting consult: Thereasa Solo  Reason for consult:  Left renal mass  History of Present Illness: Brian Lawson is a 57 y.o. male with PMH significant for HTN and gout who was admitted on 01/01/13 after being found unresponsive on the floor at home by his mother.  He reported a several day hx of diarrhea and fevers.  He has been found to have acute hypoxic respiratory failure and SIRS due to Legionella PNA.  He has acute (possibly on chronic) renal failure and his Cr has continued to rise.  Cr on admit was 3.93 and has risen to 7.61 despite IVFs.  Eval of renal failure included a renal U/S which revealed a large mass on the left kidney.  This prompted a non contrasted MRI which confirmed an 8.4 x 9.5 cm complex cystic and solid mass off the lower/mid pole of the left kidney.  There is no evidence of metastasis or renal vein involvement seen on this non contrasted study.    Pt is resting comfortably in bed.  He denies HA, CP, SOB, F/C, N/V, diarrhea/constipation, and abdominal pain.  He denies a history of voiding or storage urinary symptoms, hematuria, UTIs, STDs, urolithiasis, GU malignancy/trauma/surgery.  He is not currently on dialysis, however, the nephrologists have explained that he may require at least temporary HD in the near future.    Past Medical History  Diagnosis Date  . Hypertension   . Gout     Past Surgical History  Procedure Laterality Date  . Hernia repair      Current Hospital Medications:  Home Meds:    Medication List    ASK your doctor about these medications       allopurinol 100 MG tablet  Commonly known as:  ZYLOPRIM  Take 100 mg by mouth daily.     lisinopril-hydrochlorothiazide 10-12.5 MG per tablet  Commonly known as:  PRINZIDE,ZESTORETIC  Take 1 tablet by mouth daily.        Scheduled Meds: . enoxaparin (LOVENOX) injection  30 mg Subcutaneous Q24H  . feeding supplement (RESOURCE BREEZE)  1 Container Oral TID BM  .  [START ON 01/06/2013] levofloxacin (LEVAQUIN) IV  500 mg Intravenous Q48H  . levofloxacin (LEVAQUIN) IV  750 mg Intravenous Once  . sodium bicarbonate  650 mg Oral BID   Continuous Infusions: . sodium chloride 20 mL/hr (01/04/13 0644)   PRN Meds:.acetaminophen, acetaminophen, alum & mag hydroxide-simeth, HYDROmorphone (DILAUDID) injection, ondansetron (ZOFRAN) IV, ondansetron, oxyCODONE  Allergies: No Known Allergies  History reviewed. No pertinent family history.  Social History:  reports that he has quit smoking. He has never used smokeless tobacco. He reports that he drinks alcohol. He reports that he does not use illicit drugs.  ROS: A complete review of systems was performed.  All systems are negative except for pertinent findings as noted.  Physical Exam:  Vital signs in last 24 hours: Temp:  [98.8 F (37.1 C)-103.1 F (39.5 C)] 100.1 F (37.8 C) (10/27 1133) Pulse Rate:  [92-115] 95 (10/27 1400) Resp:  [0-32] 31 (10/27 1400) BP: (115-157)/(78-117) 157/99 mmHg (10/27 1400) SpO2:  [61 %-100 %] 61 % (10/27 1400) Constitutional:  Alert and oriented, No acute distress Cardiovascular: Regular rate and rhythm Respiratory: Normal respiratory effort GI: Abdomen is soft, nontender, nondistended, no abdominal masses GU: No CVA tenderness Lymphatic: No lymphadenopathy Neurologic: Grossly intact, no focal deficits Psychiatric: Normal mood and affect  Laboratory Data:   Recent Labs  01/01/13 1859 01/02/13 0509 01/03/13 0450 01/04/13  0530  WBC 12.0* 11.7* 12.1* 10.0  HGB 15.4 14.9 13.2 11.6*  HCT 45.7 43.6 38.8* 33.5*  PLT 112* 100* 107* 123*     Recent Labs  01/01/13 1859 01/02/13 0509 01/03/13 0450 01/03/13 2330  NA 136 138 135 136  K 4.0 4.5 4.6 4.2  CL 99 104 100 101  GLUCOSE 140* 122* 95 123*  BUN 54* 59* 80* 88*  CALCIUM 9.2 8.5 7.8* 7.3*  CREATININE 3.93* 4.67* 6.49* 7.61*     Results for orders placed during the hospital encounter of 01/01/13 (from  the past 24 hour(s))  IMMUNOFIXATION ELECTROPHORESIS, URINE (WITH TOT PROT)     Status: Abnormal   Collection Time    01/03/13  6:00 PM      Result Value Range   Time RANDOM     Volume, Urine RANDOM     Total Protein, Urine 73.0     Total Protein, Urine-Ur/day NOT CALC  10 - 140 mg/day   Albumin, U PENDING     Alpha 1, Urine PENDING     Alpha 2, Urine PENDING     Beta, Urine PENDING     Gamma Globulin, Urine PENDING     Free Kappa Lt Chains,Ur 53.50 (*) 0.14 - 2.42 mg/dL   Free Lt Chn Excr Rate NOT CALC     Free Lambda Lt Chains,Ur 12.70 (*) 0.02 - 0.67 mg/dL   Free Lambda Excretion/Day NOT CALC     Free Kappa/Lambda Ratio 4.21  2.04 - 10.37 ratio   Immunofixation, Urine PENDING    PROTEIN / CREATININE RATIO, URINE     Status: Abnormal   Collection Time    01/03/13  6:00 PM      Result Value Range   Creatinine, Urine 73.38     Total Protein, Urine 63.5     PROTEIN CREATININE RATIO 0.87 (*) 0.00 - 0.15  SODIUM, URINE, RANDOM     Status: None   Collection Time    01/03/13  6:00 PM      Result Value Range   Sodium, Ur 56    BASIC METABOLIC PANEL     Status: Abnormal   Collection Time    01/03/13 11:30 PM      Result Value Range   Sodium 136  135 - 145 mEq/L   Potassium 4.2  3.5 - 5.1 mEq/L   Chloride 101  96 - 112 mEq/L   CO2 18 (*) 19 - 32 mEq/L   Glucose, Bld 123 (*) 70 - 99 mg/dL   BUN 88 (*) 6 - 23 mg/dL   Creatinine, Ser 7.61 (*) 0.50 - 1.35 mg/dL   Calcium 7.3 (*) 8.4 - 10.5 mg/dL   GFR calc non Af Amer 7 (*) >90 mL/min   GFR calc Af Amer 8 (*) >90 mL/min  PROTIME-INR     Status: None   Collection Time    01/03/13 11:30 PM      Result Value Range   Prothrombin Time 14.7  11.6 - 15.2 seconds   INR 1.17  0.00 - 1.49  LACTATE DEHYDROGENASE     Status: Abnormal   Collection Time    01/03/13 11:30 PM      Result Value Range   LDH 399 (*) 94 - 250 U/L  HAPTOGLOBIN     Status: Abnormal   Collection Time    01/03/13 11:30 PM      Result Value Range    Haptoglobin 291 (*) 45 - 215 mg/dL  HEPATITIS  PANEL, ACUTE     Status: None   Collection Time    01/03/13 11:30 PM      Result Value Range   Hepatitis B Surface Ag NEGATIVE  NEGATIVE   HCV Ab NEGATIVE  NEGATIVE   Hep A IgM NON REACTIVE  NON REACTIVE   Hep B C IgM NON REACTIVE  NON REACTIVE  CK     Status: Abnormal   Collection Time    01/03/13 11:30 PM      Result Value Range   Total CK 637 (*) 7 - 232 U/L  CK     Status: Abnormal   Collection Time    01/04/13  5:30 AM      Result Value Range   Total CK 526 (*) 7 - 232 U/L  CBC     Status: Abnormal   Collection Time    01/04/13  5:30 AM      Result Value Range   WBC 10.0  4.0 - 10.5 K/uL   RBC 4.09 (*) 4.22 - 5.81 MIL/uL   Hemoglobin 11.6 (*) 13.0 - 17.0 g/dL   HCT 33.5 (*) 39.0 - 52.0 %   MCV 81.9  78.0 - 100.0 fL   MCH 28.4  26.0 - 34.0 pg   MCHC 34.6  30.0 - 36.0 g/dL   RDW 16.6 (*) 11.5 - 15.5 %   Platelets 123 (*) 150 - 400 K/uL  PARATHYROID HORMONE, INTACT (NO CA)     Status: Abnormal   Collection Time    01/04/13  5:30 AM      Result Value Range   PTH 594.5 (*) 14.0 - 72.0 pg/mL  PROCALCITONIN     Status: None   Collection Time    01/04/13  5:30 AM      Result Value Range   Procalcitonin 34.72     Recent Results (from the past 240 hour(s))  CULTURE, BLOOD (ROUTINE X 2)     Status: None   Collection Time    01/01/13  8:30 PM      Result Value Range Status   Specimen Description BLOOD RIGHT ARM   Final   Special Requests BOTTLES DRAWN AEROBIC AND ANAEROBIC 10CC   Final   Culture  Setup Time     Final   Value: 01/02/2013 01:13     Performed at [facility]   Culture     Final   Value:        BLOOD CULTURE RECEIVED NO GROWTH TO DATE CULTURE WILL BE HELD FOR 5 DAYS BEFORE ISSUING A FINAL NEGATIVE REPORT     Performed at [facility]   Report Status PENDING   Incomplete  CULTURE, BLOOD (ROUTINE X 2)     Status: None   Collection Time    01/01/13  8:45 PM      Result Value Range Status    Specimen Description BLOOD RIGHT HAND   Final   Special Requests BOTTLES DRAWN AEROBIC AND ANAEROBIC 10CC   Final   Culture  Setup Time     Final   Value: 01/02/2013 01:41     Performed at [facility]   Culture     Final   Value:        BLOOD CULTURE RECEIVED NO GROWTH TO DATE CULTURE WILL BE HELD FOR 5 DAYS BEFORE ISSUING A FINAL NEGATIVE REPORT     Performed at [facility]   Report Status PENDING   Incomplete  MRSA PCR  SCREENING     Status: None   Collection Time    01/01/13 10:25 PM      Result Value Range Status   MRSA by PCR NEGATIVE  NEGATIVE Final   Comment:            The GeneXpert MRSA Assay (FDA     approved for NASAL specimens     only), is one component of a     comprehensive MRSA colonization     surveillance program. It is not     intended to diagnose MRSA     infection nor to guide or     monitor treatment for     MRSA infections.    Renal Function:  Recent Labs  01/01/13 1859 01/02/13 0509 01/03/13 0450 01/03/13 2330  CREATININE 3.93* 4.67* 6.49* 7.61*   Estimated Creatinine Clearance: 12.6 ml/min (by C-G formula based on Cr of 7.61).  Radiologic Imaging: Mr Herby Abraham Contrast  01/03/2013   *RADIOLOGY REPORT*  Clinical Data: Found down. Loss of consciousness.  MRI HEAD WITHOUT CONTRAST  Technique:  Multiplanar, multiecho pulse sequences of the brain and surrounding structures were obtained according to standard protocol without intravenous contrast.  Comparison: CT head without contrast 01/01/2013.  Findings: Scattered periventricular and subcortical T2 hyperintensities are present bilaterally.  No acute infarct, hemorrhage, mass lesion is present.  The ventricles are normal size.  No significant extra-axial fluid collection is present.  Flow is present in the major intracranial arteries.  The globes orbits are intact.  Mild mucosal thickening is most evident in the sphenoid sinuses and ethmoid air cells.  There is some fluid in the mastoid  air cells bilaterally.  No obstructing nasopharyngeal lesion is evident.  IMPRESSION:  1.  No acute intracranial abnormality. 2.  Diffuse white matter changes are markedly advanced for age. The finding is nonspecific but can be seen in the setting of chronic microvascular ischemia, a demyelinating process such as multiple sclerosis, vasculitis, complicated migraine headaches, or as the sequelae of a prior infectious or inflammatory process.   Original Report Authenticated By: San Morelle, M.D.   Mr Abdomen Wo Contrast  01/04/2013   CLINICAL DATA:  Left renal mass. No IV contrast was administered due to chronic renal insufficiency  EXAM: MRI ABDOMEN WITHOUT CONTRAST  TECHNIQUE: Multiplanar, multisequence MR imaging was performed. No intravenous contrast was administered.  COMPARISON:  Ultrasound 01/02/2013  FINDINGS: There is a large rounded mass extending laterally from the mid and lower pole of the left kidney. The mass measures 8.4 x 8.9 x 9.5 cm. The mass pass encapsulating 7 mm rim on the T2 weighted images (series 4). There is extensive nodularity extending centrally from this capsulated margin. There is some high T2 signal centrally suggesting some cystic change or hemorrhage. No evidence of macroscopic fat within the lesion on the fat-suppressed T1 sequences.  The mass lesion is removed from the hilum without gross involvement of the renal vein on this noncontrast exam. The right kidney appears normal. Adrenal glands normal. No retroperitoneal adenopathy.  The liver, gallbladder, pancreas, spleen are normal. EXAM is this degraded by patient respiratory motion.  IMPRESSION: 1. 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  2. No evidence of metastasis or involvement of the renal vein this non contrast exam. If the patient is a chronic dialysis patient, a CT with IV contrast could be performed.   Electronically Signed   By: Helane Gunther.D.  On: 01/04/2013 08:15   Dg Chest Port 1 View  01/03/2013   CLINICAL DATA:  Central line placement.  EXAM: PORTABLE CHEST - 1 VIEW  COMPARISON:  Earlier film of the same day  FINDINGS: Interval placement of a right IJ central line, tip near the cavoatrial junction. No pneumothorax. Persistent left lower lung consolidation/ atelectasis. Right lung clear. Heart size upper limits normal. Regional bones unremarkable.  IMPRESSION: 1. Central line placement to cavoatrial junction without pneumothorax.   Electronically Signed   By: Arne Cleveland M.D.   On: 01/03/2013 22:35   Dg Chest Port 1 View  01/03/2013   CLINICAL DATA:  Cough, pneumonia  EXAM: PORTABLE CHEST - 1 VIEW  COMPARISON:  01/01/2013  FINDINGS: Heart size and vascular pattern normal. Right lung is clear. Extensive hazy opacity throughout the left lower lobe. This is significantly increased when compared to the prior study.  IMPRESSION: Extensive hazy left lower lobe airspace opacification, significantly worse when compared to the prior study.   Electronically Signed   By: Skipper Cliche M.D.   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  Pt will likely require removal of mass via partial vs radical nephrectomy, however, he will need to be stabilized from his current medical issues before this could be considered.  Removal of part or all of his kidney could of course play a role in whether or not he would require HD.  Dr. Jeffie Pollock will review the imaging and make recommendations.   Brian Lawson 01/04/2013, 3:33 PM    I have reviewed the patient's records including notes from the involved physicians, the salient labs and x-rays.   I have interviewed the patient and completed an exam consistent with the finding above.     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     He will need follow up with one of our renal surgeons for consideration of either a partial nephrectomy or radical nephrectomy for treatment of this mass once he has recovered from the pneumonia and sepsis.

## 2013-01-04 NOTE — Progress Notes (Signed)
Subjective: Patient reports some continued intermittent difficulty with words.  Is otherwise unchanged.  Alert.  On antibiotics.    Objective: Current vital signs: BP 151/92  Pulse 96  Temp(Src) 99.6 F (37.6 C) (Oral)  Resp 24  Ht 6\' 3"  (1.905 m)  Wt 97 kg (213 lb 13.5 oz)  BMI 26.73 kg/m2  SpO2 99% Vital signs in last 24 hours: Temp:  [98.8 F (37.1 C)-103.1 F (39.5 C)] 99.6 F (37.6 C) (10/27 0747) Pulse Rate:  [88-115] 96 (10/27 0747) Resp:  [0-32] 24 (10/27 0747) BP: (111-151)/(71-117) 151/92 mmHg (10/27 0747) SpO2:  [89 %-100 %] 99 % (10/27 0747)  Intake/Output from previous day: 10/26 0701 - 10/27 0700 In: 5567 [P.O.:120; I.V.:3147; IV Piggyback:2300] Out: 57 [Urine:910] Intake/Output this shift:   Nutritional status: Cardiac  Neurologic Exam: Mental Status:  Alert, awake, oriented x 4, thought content appropriate. Some mild word finding difficulties at times.  Able to follow 3 step commands without difficulty.  Cranial Nerves:  II: Discs flat bilaterally; Visual fields grossly normal, pupils equal, round, reactive to light and accommodation  III,IV, VI: ptosis not present, extra-ocular motions intact bilaterally  V,VII: smile symmetric, facial light touch sensation normal bilaterally  VIII: hearing normal bilaterally  IX,X: gag reflex present  XI: bilateral shoulder shrug  XII: midline tongue extension without atrophy or fasciculations  Motor:  5/5 throughout Sensory: Pinprick and light touch intact throughout, bilaterally  Deep Tendon Reflexes:  2+ throughout Plantars:  Right: downgoing    Left: downgoing  Cerebellar:  normal finger-to-nose, normal heel-to-shin test     Lab Results: Basic Metabolic Panel:  Recent Labs Lab 01/01/13 1859 01/02/13 0509 01/03/13 0450 01/03/13 2330  NA 136 138 135 136  K 4.0 4.5 4.6 4.2  CL 99 104 100 101  CO2 22 20 18* 18*  GLUCOSE 140* 122* 95 123*  BUN 54* 59* 80* 88*  CREATININE 3.93* 4.67* 6.49* 7.61*   CALCIUM 9.2 8.5 7.8* 7.3*    Liver Function Tests:  Recent Labs Lab 01/01/13 1859  AST 66*  ALT 29  ALKPHOS 60  BILITOT 0.9  PROT 8.4*  ALBUMIN 3.0*   No results found for this basename: LIPASE, AMYLASE,  in the last 168 hours No results found for this basename: AMMONIA,  in the last 168 hours  CBC:  Recent Labs Lab 01/01/13 1859 01/02/13 0509 01/03/13 0450 01/04/13 0530  WBC 12.0* 11.7* 12.1* 10.0  NEUTROABS 10.2*  --   --   --   HGB 15.4 14.9 13.2 11.6*  HCT 45.7 43.6 38.8* 33.5*  MCV 84.0 84.7 82.4 81.9  PLT 112* 100* 107* 123*    Cardiac Enzymes:  Recent Labs Lab 01/03/13 2330 01/04/13 0530  CKTOTAL 637* 526*    Lipid Panel: No results found for this basename: CHOL, TRIG, HDL, CHOLHDL, VLDL, LDLCALC,  in the last 168 hours  CBG: No results found for this basename: GLUCAP,  in the last 168 hours  Microbiology: Results for orders placed during the hospital encounter of 01/01/13  CULTURE, BLOOD (ROUTINE X 2)     Status: None   Collection Time    01/01/13  8:30 PM      Result Value Range Status   Specimen Description BLOOD RIGHT ARM   Final   Special Requests BOTTLES DRAWN AEROBIC AND ANAEROBIC 10CC   Final   Culture  Setup Time     Final   Value: 01/02/2013 01:13     Performed at [facility]  Culture     Final   Value:        BLOOD CULTURE RECEIVED NO GROWTH TO DATE CULTURE WILL BE HELD FOR 5 DAYS BEFORE ISSUING A FINAL NEGATIVE REPORT     Performed at [facility]   Report Status PENDING   Incomplete  CULTURE, BLOOD (ROUTINE X 2)     Status: None   Collection Time    01/01/13  8:45 PM      Result Value Range Status   Specimen Description BLOOD RIGHT HAND   Final   Special Requests BOTTLES DRAWN AEROBIC AND ANAEROBIC 10CC   Final   Culture  Setup Time     Final   Value: 01/02/2013 01:41     Performed at [facility]   Culture     Final   Value:        BLOOD CULTURE RECEIVED NO GROWTH TO DATE CULTURE WILL BE HELD  FOR 5 DAYS BEFORE ISSUING A FINAL NEGATIVE REPORT     Performed at [facility]   Report Status PENDING   Incomplete  MRSA PCR SCREENING     Status: None   Collection Time    01/01/13 10:25 PM      Result Value Range Status   MRSA by PCR NEGATIVE  NEGATIVE Final   Comment:            The GeneXpert MRSA Assay (FDA     approved for NASAL specimens     only), is one component of a     comprehensive MRSA colonization     surveillance program. It is not     intended to diagnose MRSA     infection nor to guide or     monitor treatment for     MRSA infections.    Coagulation Studies:  Recent Labs  01/03/13 2330  LABPROT 14.7  INR 1.17    Imaging: Mr Brain Wo Contrast  01/03/2013   *RADIOLOGY REPORT*  Clinical Data: Found down. Loss of consciousness.  MRI HEAD WITHOUT CONTRAST  Technique:  Multiplanar, multiecho pulse sequences of the brain and surrounding structures were obtained according to standard protocol without intravenous contrast.  Comparison: CT head without contrast 01/01/2013.  Findings: Scattered periventricular and subcortical T2 hyperintensities are present bilaterally.  No acute infarct, hemorrhage, mass lesion is present.  The ventricles are normal size.  No significant extra-axial fluid collection is present.  Flow is present in the major intracranial arteries.  The globes orbits are intact.  Mild mucosal thickening is most evident in the sphenoid sinuses and ethmoid air cells.  There is some fluid in the mastoid air cells bilaterally.  No obstructing nasopharyngeal lesion is evident.  IMPRESSION:  1.  No acute intracranial abnormality. 2.  Diffuse white matter changes are markedly advanced for age. The finding is nonspecific but can be seen in the setting of chronic microvascular ischemia, a demyelinating process such as multiple sclerosis, vasculitis, complicated migraine headaches, or as the sequelae of a prior infectious or inflammatory process.   Original  Report Authenticated By: San Morelle, M.D.   Mr Abdomen Wo Contrast  01/04/2013   CLINICAL DATA:  Left renal mass. No IV contrast was administered due to chronic renal insufficiency  EXAM: MRI ABDOMEN WITHOUT CONTRAST  TECHNIQUE: Multiplanar, multisequence MR imaging was performed. No intravenous contrast was administered.  COMPARISON:  Ultrasound 01/02/2013  FINDINGS: There is a large rounded mass extending laterally from the mid and lower pole of the  left kidney. The mass measures 8.4 x 8.9 x 9.5 cm. The mass pass encapsulating 7 mm rim on the T2 weighted images (series 4). There is extensive nodularity extending centrally from this capsulated margin. There is some high T2 signal centrally suggesting some cystic change or hemorrhage. No evidence of macroscopic fat within the lesion on the fat-suppressed T1 sequences.  The mass lesion is removed from the hilum without gross involvement of the renal vein on this noncontrast exam. The right kidney appears normal. Adrenal glands normal. No retroperitoneal adenopathy.  The liver, gallbladder, pancreas, spleen are normal. EXAM is this degraded by patient respiratory motion.  IMPRESSION: 1. 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  2. No evidence of metastasis or involvement of the renal vein this non contrast exam. If the patient is a chronic dialysis patient, a CT with IV contrast could be performed.   Electronically Signed   By: Suzy Bouchard M.D.   On: 01/04/2013 08:15   US Renal  01/02/2013   CLINICAL DATA:  Renal failure.  EXAM: RENAL/URINARY TRACT ULTRASOUND COMPLETE  COMPARISON:  None.  FINDINGS: Right Kidney  Length: 10.0 cm. The right kidney demonstrates increased cortical echogenicity without significant atrophy or no evidence of hydronephrosis. No focal lesions are identified. There is no evidence of hydronephrosis.  Left Kidney  Length: 10.9 cm. Heterogeneous mass emanates from the  lateral aspect of the mid to lower left kidney. This mass appears solid but does not demonstrate a great deal of internal blood flow. This mass-like area measures approximately 8.8 x 8.8 x 7.8 cm. The kidney itself shows no evidence of hydronephrosis.  Bladder  Appears normal for degree of bladder distention.  IMPRESSION: No evidence of hydronephrosis. Nearly 9 cm mass-like area of the left kidney which does not demonstrate significant internal blood flow. This is likely a neoplasm but could potentially also represent a complicated cyst. In the setting of renal failure, further evaluation would be helpful with unenhanced MRI of the abdomen.   Electronically Signed   By: Aletta Edouard M.D.   On: 01/02/2013 15:06   Dg Chest Port 1 View  01/03/2013   CLINICAL DATA:  Central line placement.  EXAM: PORTABLE CHEST - 1 VIEW  COMPARISON:  Earlier film of the same day  FINDINGS: Interval placement of a right IJ central line, tip near the cavoatrial junction. No pneumothorax. Persistent left lower lung consolidation/ atelectasis. Right lung clear. Heart size upper limits normal. Regional bones unremarkable.  IMPRESSION: 1. Central line placement to cavoatrial junction without pneumothorax.   Electronically Signed   By: Arne Cleveland M.D.   On: 01/03/2013 22:35   Dg Chest Port 1 View  01/03/2013   CLINICAL DATA:  Cough, pneumonia  EXAM: PORTABLE CHEST - 1 VIEW  COMPARISON:  01/01/2013  FINDINGS: Heart size and vascular pattern normal. Right lung is clear. Extensive hazy opacity throughout the left lower lobe. This is significantly increased when compared to the prior study.  IMPRESSION: Extensive hazy left lower lobe airspace opacification, significantly worse when compared to the prior study.   Electronically Signed   By: Skipper Cliche M.D.   On: 01/03/2013 07:48    Medications:  I have reviewed the patient's current medications. Scheduled: . azithromycin  500 mg Intravenous Q24H  . enoxaparin (LOVENOX)  injection  30 mg Subcutaneous Q24H  . feeding supplement (RESOURCE BREEZE)  1 Container Oral TID BM    Assessment/Plan: Patient with  continued intermittent difficulty with word finding.  MRI of the brain reviewed and shows no acute changes.  Patient does have extensive white matter changes though.  It may very well be the case that with his concurrent illness it is unmasking some signs and symptoms  referable to these white matter changes.    Recommendations: 1.  EEG 2.  At discharge patient should follow up with neurology on an outpatient basis.     LOS: 3 days   Alexis Goodell, MD [facility] [phone] 01/04/2013  8:39 AM

## 2013-01-04 NOTE — Progress Notes (Signed)
Portable EEG completed

## 2013-01-05 DIAGNOSIS — G9341 Metabolic encephalopathy: Secondary | ICD-10-CM

## 2013-01-05 DIAGNOSIS — A481 Legionnaires' disease: Secondary | ICD-10-CM

## 2013-01-05 DIAGNOSIS — N289 Disorder of kidney and ureter, unspecified: Secondary | ICD-10-CM

## 2013-01-05 LAB — CBC
HCT: 34 % — ABNORMAL LOW (ref 39.0–52.0)
Hemoglobin: 12.2 g/dL — ABNORMAL LOW (ref 13.0–17.0)
MCH: 29.1 pg (ref 26.0–34.0)
MCHC: 35.9 g/dL (ref 30.0–36.0)
RBC: 4.19 MIL/uL — ABNORMAL LOW (ref 4.22–5.81)

## 2013-01-05 LAB — UIFE/LIGHT CHAINS/TP QN, 24-HR UR
Albumin, U: DETECTED
Alpha 1, Urine: DETECTED — AB
Alpha 2, Urine: DETECTED — AB
Beta, Urine: DETECTED — AB
Free Kappa Lt Chains,Ur: 53.5 mg/dL — ABNORMAL HIGH (ref 0.14–2.42)
Free Kappa/Lambda Ratio: 4.21 ratio (ref 2.04–10.37)
Gamma Globulin, Urine: DETECTED — AB
Total Protein, Urine: 73 mg/dL

## 2013-01-05 LAB — RENAL FUNCTION PANEL
BUN: 94 mg/dL — ABNORMAL HIGH (ref 6–23)
CO2: 21 mEq/L (ref 19–32)
Calcium: 7.4 mg/dL — ABNORMAL LOW (ref 8.4–10.5)
Chloride: 99 mEq/L (ref 96–112)
Creatinine, Ser: 8.09 mg/dL — ABNORMAL HIGH (ref 0.50–1.35)
GFR calc Af Amer: 8 mL/min — ABNORMAL LOW (ref >90)
GFR calc non Af Amer: 6 mL/min — ABNORMAL LOW (ref >90)
Glucose, Bld: 117 mg/dL — ABNORMAL HIGH (ref 70–99)

## 2013-01-05 LAB — MPO/PR-3 (ANCA) ANTIBODIES: Serine Protease 3: <1 AU/mL (ref <20)

## 2013-01-05 LAB — KAPPA/LAMBDA LIGHT CHAINS
Kappa, lambda light chain ratio: 1.64 (ref 0.26–1.65)
Lambda free light chains: 6.17 mg/dL — ABNORMAL HIGH (ref 0.57–2.63)

## 2013-01-05 LAB — ANA: Anti Nuclear Antibody(ANA): NEGATIVE

## 2013-01-05 LAB — GLOMERULAR BASEMENT MEMBRANE ANTIBODIES: GBM Ab: <1 AU/mL (ref <20)

## 2013-01-05 MED ORDER — LABETALOL HCL 5 MG/ML IV SOLN
0.5000 mg/min | INTRAVENOUS | Status: DC
  Administered 2013-01-06: 0.5 mg/min via INTRAVENOUS
  Administered 2013-01-06 (×5): 3 mg/min via INTRAVENOUS
  Administered 2013-01-06: 10 mg via INTRAVENOUS
  Administered 2013-01-06 – 2013-01-07 (×8): 3 mg/min via INTRAVENOUS
  Administered 2013-01-08 – 2013-01-09 (×3): 2 mg/min via INTRAVENOUS
  Filled 2013-01-05 (×24): qty 100

## 2013-01-05 MED ORDER — LABETALOL HCL 200 MG PO TABS
200.0000 mg | ORAL_TABLET | Freq: Two times a day (BID) | ORAL | Status: DC
  Administered 2013-01-05: 200 mg via ORAL
  Filled 2013-01-05 (×2): qty 1

## 2013-01-05 MED ORDER — CLONIDINE HCL 0.2 MG PO TABS
0.2000 mg | ORAL_TABLET | Freq: Two times a day (BID) | ORAL | Status: DC
  Administered 2013-01-05 – 2013-01-06 (×3): 0.2 mg via ORAL
  Filled 2013-01-05 (×4): qty 1

## 2013-01-05 MED ORDER — LABETALOL HCL 300 MG PO TABS
300.0000 mg | ORAL_TABLET | Freq: Two times a day (BID) | ORAL | Status: DC
  Administered 2013-01-05: 300 mg via ORAL
  Filled 2013-01-05 (×4): qty 1

## 2013-01-05 MED ORDER — LABETALOL HCL 5 MG/ML IV SOLN
5.0000 mg | INTRAVENOUS | Status: DC | PRN
  Administered 2013-01-05 – 2013-01-08 (×4): 5 mg via INTRAVENOUS
  Filled 2013-01-05 (×5): qty 4

## 2013-01-05 MED ORDER — CLONIDINE HCL ER 0.1 MG PO TB12: 0.2000 mg | ORAL_TABLET | Freq: Two times a day (BID) | ORAL | Status: DC

## 2013-01-05 NOTE — Progress Notes (Addendum)
[facility] Progress Note [place] TEAM 1 - Stepdown/ICU TEAM   Brian Lawson [id number] DOB: 1955/03/24 DOA: 01/01/2013 PCP: "[facility] " - per pt   Brief narrative: 57 y/o male who presented to ER after found on the floor by his mother. 3 days of diarrhea and cough- fever of 105 in the ER. Has a h/o Gout and HTN- has no h/o chronic renal failure as far as he knows. Last saw his doctor about 3 mo ago. Has no urinary complaints.   Assessment/Plan:  Sepsis due to PNA -supportive care -Legionella PNA and associated dehydration and ARF  Acute hypoxic respiratory failure due to Legionella PNA - changed antibiotics from vanc and Zosyn to Zithro and Rocephin to appropriately treat CAP but since Legionella positive will change to Levaquin (per UptoDate: fluoroquinolones decrease fever better and faster and also shorten hospital stays) - Flu PCR negative - f/u on blood cx   ARF/metabolic acidosis - renal ultrasound- no obstruction but incidental mass- MRI confirms  - oliguric-  place foley to measure output - noted to be on ACE at home therefore may be ATN - Nephro following- felt 2/2 ATN so hydration continued and bicarb gtt started 10/27 but as of today renal function has worsened and concerned he may need HD this admit. 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 -Dr. Jeffie Pollock recommends outpt f/u   Slurred speech/metabolic encephalopathy  - neuro eval much appreciated - MRI head without acute changes -EEG pending -likely due to progressive azotemia    GOUT - cont Allopurinol    HYPERTENSION - BP low - hold ACE/ HCTZ combination - likely due to fluid overload- now 9 L positive  - needs dialysis to remove excess fluid- not diuresing as well as he should be  Thrombocytopenia - likely due to sepsis - follow  Diarrhea due to legionella - none here so  far - will d/c stool studies   Code Status: FULL Family Communication: no family present at time of exam Disposition Plan: SDU  Consultants: Nephrology Urology Neurology  Procedures: EEG pending  Antibiotics: Vanc/ Zosyn 10/25 >>> 10/26 Levaquin 10/27 >>> Zithromax 10/25 >>> 10/27 Rocephin 10/25 >>> 10/26  DVT prophylaxis: SQ heparin  HPI/Subjective: Pt nauseated today- feels unwell, restless  Objective: Blood pressure 181/120, pulse 88, temperature 97.6 F (36.4 C), temperature source Oral, resp. rate 19, height 6\' 3"  (1.905 m), weight 114.6 kg (252 lb 10.4 oz), SpO2 97.00%.  Intake/Output Summary (Last 24 hours) at 01/05/13 1343 Last data filed at 01/05/13 1300  Gross per 24 hour  Intake   1810 ml  Output    600 ml  Net   1210 ml    Exam: General: answering questions appropriately - No acute respiratory distress Lungs: crackles in LLL, 2L Cardiovascular: Regular rate and rhythm without murmur gallop or rub normal S1 and S2 Abdomen: Nontender, nondistended, soft, bowel sounds positive, no rebound, no ascites, no appreciable mass Extremities: No significant cyanosis, clubbing, or edema bilateral lower extremities  Data Reviewed: Basic Metabolic Panel:  Recent Labs Lab 01/01/13 1859 01/02/13 0509 01/03/13 0450 01/03/13 2330 01/05/13 0425  NA 136 138 135 136 135  K 4.0 4.5 4.6 4.2 3.4*  CL 99 104 100 101 99  CO2 22 20 18* 18* 21  GLUCOSE 140* 122* 95 123* 117*  BUN 54* 59* 80* 88* 94*  CREATININE 3.93* 4.67* 6.49* 7.61*  8.09*  CALCIUM 9.2 8.5 7.8* 7.3* 7.4*  PHOS  --   --   --   --  6.8*   Liver Function Tests:  Recent Labs Lab 01/01/13 1859 01/05/13 0425  AST 66*  --   ALT 29  --   ALKPHOS 60  --   BILITOT 0.9  --   PROT 8.4*  --   ALBUMIN 3.0* 1.6*   No results found for this basename: LIPASE, AMYLASE,  in the last 168 hours No results found for this basename: AMMONIA,  in the last 168 hours  CBC:  Recent Labs Lab 01/01/13 1859  01/02/13 0509 01/03/13 0450 01/04/13 0530 01/05/13 0425  WBC 12.0* 11.7* 12.1* 10.0 8.7  NEUTROABS 10.2*  --   --   --   --   HGB 15.4 14.9 13.2 11.6* 12.2*  HCT 45.7 43.6 38.8* 33.5* 34.0*  MCV 84.0 84.7 82.4 81.9 81.1  PLT 112* 100* 107* 123* 153   Cardiac Enzymes:  Recent Labs Lab 01/03/13 2330 01/04/13 0530  CKTOTAL 637* 526*    Recent Results (from the past 240 hour(s))  CULTURE, BLOOD (ROUTINE X 2)     Status: None   Collection Time    01/01/13  8:30 PM      Result Value Range Status   Specimen Description BLOOD RIGHT ARM   Final   Special Requests BOTTLES DRAWN AEROBIC AND ANAEROBIC 10CC   Final   Culture  Setup Time     Final   Value: 01/02/2013 01:13     Performed at [facility]   Culture     Final   Value:        BLOOD CULTURE RECEIVED NO GROWTH TO DATE CULTURE WILL BE HELD FOR 5 DAYS BEFORE ISSUING A FINAL NEGATIVE REPORT     Performed at [facility]   Report Status PENDING   Incomplete  CULTURE, BLOOD (ROUTINE X 2)     Status: None   Collection Time    01/01/13  8:45 PM      Result Value Range Status   Specimen Description BLOOD RIGHT HAND   Final   Special Requests BOTTLES DRAWN AEROBIC AND ANAEROBIC 10CC   Final   Culture  Setup Time     Final   Value: 01/02/2013 01:41     Performed at [facility]   Culture     Final   Value:        BLOOD CULTURE RECEIVED NO GROWTH TO DATE CULTURE WILL BE HELD FOR 5 DAYS BEFORE ISSUING A FINAL NEGATIVE REPORT     Performed at [facility]   Report Status PENDING   Incomplete  MRSA PCR SCREENING     Status: None   Collection Time    01/01/13 10:25 PM      Result Value Range Status   MRSA by PCR NEGATIVE  NEGATIVE Final   Comment:            The GeneXpert MRSA Assay (FDA     approved for NASAL specimens     only), is one component of a     comprehensive MRSA colonization     surveillance program. It is not     intended to diagnose MRSA     infection nor to guide or     monitor  treatment for     MRSA infections.     Studies:  Recent x-ray studies have been reviewed in detail by the  Attending Physician  Scheduled Meds:  Scheduled Meds: . cloNIDine  0.2 mg Oral BID  . feeding supplement (RESOURCE BREEZE)  1 Container Oral TID BM  . heparin subcutaneous  5,000 Units Subcutaneous Q8H  . labetalol  300 mg Oral BID  . [START ON 01/06/2013] levofloxacin (LEVAQUIN) IV  500 mg Intravenous Q48H  . sodium bicarbonate  650 mg Oral BID   Time spent on care of this patient: 30 min   Debbe Odea, MD  [facility] Office  [phone] Pager - Text Page per Shea Evans as per below:  On-Call/Text Page:      [email or web address]      password [email or web address]  If 7PM-7AM, please contact night-coverage [email or web address] Password [email or web address] 01/05/2013, 1:43 PM   LOS: 4 days

## 2013-01-05 NOTE — Progress Notes (Signed)
Paged Tylene Fantasia 2 times during the night about patient's BP going up with no prn meds to give. No call back as of now with patients current bp pf 158/106. Charge nurse also notified.

## 2013-01-05 NOTE — Progress Notes (Signed)
Subjective: Patient awake and alert.  Patient currently without speech complaints.  Did well overnight.  Today's nurse reports some delay at times getting words out.  BP remains elevated.    Objective: Current vital signs: BP 165/106  Pulse 75  Temp(Src) 97.9 F (36.6 C) (Oral)  Resp 19  Ht 6\' 3"  (1.905 m)  Wt 114.6 kg (252 lb 10.4 oz)  BMI 31.58 kg/m2  SpO2 99% Vital signs in last 24 hours: Temp:  [93 F (33.9 C)-100.1 F (37.8 C)] 97.9 F (36.6 C) (10/28 0750) Pulse Rate:  [75-97] 75 (10/28 0700) Resp:  [13-31] 19 (10/28 0700) BP: (144-170)/(92-111) 165/106 mmHg (10/28 0700) SpO2:  [61 %-100 %] 99 % (10/28 0750) Weight:  [114.6 kg (252 lb 10.4 oz)] 114.6 kg (252 lb 10.4 oz) (10/28 0500)  Intake/Output from previous day: 10/27 0701 - 10/28 0700 In: 2450 [P.O.:660; I.V.:1040; IV Piggyback:150] Out: 710 [Urine:710] Intake/Output this shift:   Nutritional status: Cardiac  Neurologic Exam: Mental Status:  Alert, awake, oriented x 4, thought content appropriate. Speech fluent. Able to follow 3 step commands without difficulty.  Cranial Nerves:  II: Discs flat bilaterally; Visual fields grossly normal, pupils equal, round, reactive to light and accommodation  III,IV, VI: ptosis not present, extra-ocular motions intact bilaterally  V,VII: smile symmetric, facial light touch sensation normal bilaterally  VIII: hearing normal bilaterally  IX,X: gag reflex present  XI: bilateral shoulder shrug  XII: midline tongue extension without atrophy or fasciculations  Motor:  5/5 throughout  Sensory: Pinprick and light touch intact throughout, bilaterally  Deep Tendon Reflexes:  2+ throughout  Plantars:  Right: downgoing   Left: downgoing  Cerebellar:  normal finger-to-nose, normal heel-to-shin test    Lab Results: Basic Metabolic Panel:  Recent Labs Lab 01/01/13 1859 01/02/13 0509 01/03/13 0450 01/03/13 2330 01/05/13 0425  NA 136 138 135 136 135  K 4.0 4.5 4.6 4.2 3.4*   CL 99 104 100 101 99  CO2 22 20 18* 18* 21  GLUCOSE 140* 122* 95 123* 117*  BUN 54* 59* 80* 88* 94*  CREATININE 3.93* 4.67* 6.49* 7.61* 8.09*  CALCIUM 9.2 8.5 7.8* 7.3* 7.4*  PHOS  --   --   --   --  6.8*    Liver Function Tests:  Recent Labs Lab 01/01/13 1859 01/05/13 0425  AST 66*  --   ALT 29  --   ALKPHOS 60  --   BILITOT 0.9  --   PROT 8.4*  --   ALBUMIN 3.0* 1.6*   No results found for this basename: LIPASE, AMYLASE,  in the last 168 hours No results found for this basename: AMMONIA,  in the last 168 hours  CBC:  Recent Labs Lab 01/01/13 1859 01/02/13 0509 01/03/13 0450 01/04/13 0530 01/05/13 0425  WBC 12.0* 11.7* 12.1* 10.0 8.7  NEUTROABS 10.2*  --   --   --   --   HGB 15.4 14.9 13.2 11.6* 12.2*  HCT 45.7 43.6 38.8* 33.5* 34.0*  MCV 84.0 84.7 82.4 81.9 81.1  PLT 112* 100* 107* 123* 153    Cardiac Enzymes:  Recent Labs Lab 01/03/13 2330 01/04/13 0530  CKTOTAL 637* 526*    Lipid Panel: No results found for this basename: CHOL, TRIG, HDL, CHOLHDL, VLDL, LDLCALC,  in the last 168 hours  CBG: No results found for this basename: GLUCAP,  in the last 168 hours  Microbiology: Results for orders placed during the hospital encounter of 01/01/13  CULTURE, BLOOD (ROUTINE X  2)     Status: None   Collection Time    01/01/13  8:30 PM      Result Value Range Status   Specimen Description BLOOD RIGHT ARM   Final   Special Requests BOTTLES DRAWN AEROBIC AND ANAEROBIC 10CC   Final   Culture  Setup Time     Final   Value: 01/02/2013 01:13     Performed at [facility]   Culture     Final   Value:        BLOOD CULTURE RECEIVED NO GROWTH TO DATE CULTURE WILL BE HELD FOR 5 DAYS BEFORE ISSUING A FINAL NEGATIVE REPORT     Performed at [facility]   Report Status PENDING   Incomplete  CULTURE, BLOOD (ROUTINE X 2)     Status: None   Collection Time    01/01/13  8:45 PM      Result Value Range Status   Specimen Description BLOOD RIGHT HAND    Final   Special Requests BOTTLES DRAWN AEROBIC AND ANAEROBIC 10CC   Final   Culture  Setup Time     Final   Value: 01/02/2013 01:41     Performed at [facility]   Culture     Final   Value:        BLOOD CULTURE RECEIVED NO GROWTH TO DATE CULTURE WILL BE HELD FOR 5 DAYS BEFORE ISSUING A FINAL NEGATIVE REPORT     Performed at [facility]   Report Status PENDING   Incomplete  MRSA PCR SCREENING     Status: None   Collection Time    01/01/13 10:25 PM      Result Value Range Status   MRSA by PCR NEGATIVE  NEGATIVE Final   Comment:            The GeneXpert MRSA Assay (FDA     approved for NASAL specimens     only), is one component of a     comprehensive MRSA colonization     surveillance program. It is not     intended to diagnose MRSA     infection nor to guide or     monitor treatment for     MRSA infections.    Coagulation Studies:  Recent Labs  01/03/13 2330  LABPROT 14.7  INR 1.17    Imaging: Mr Brain Wo Contrast  01/03/2013   *RADIOLOGY REPORT*  Clinical Data: Found down. Loss of consciousness.  MRI HEAD WITHOUT CONTRAST  Technique:  Multiplanar, multiecho pulse sequences of the brain and surrounding structures were obtained according to standard protocol without intravenous contrast.  Comparison: CT head without contrast 01/01/2013.  Findings: Scattered periventricular and subcortical T2 hyperintensities are present bilaterally.  No acute infarct, hemorrhage, mass lesion is present.  The ventricles are normal size.  No significant extra-axial fluid collection is present.  Flow is present in the major intracranial arteries.  The globes orbits are intact.  Mild mucosal thickening is most evident in the sphenoid sinuses and ethmoid air cells.  There is some fluid in the mastoid air cells bilaterally.  No obstructing nasopharyngeal lesion is evident.  IMPRESSION:  1.  No acute intracranial abnormality. 2.  Diffuse white matter changes are markedly advanced for  age. The finding is nonspecific but can be seen in the setting of chronic microvascular ischemia, a demyelinating process such as multiple sclerosis, vasculitis, complicated migraine headaches, or as the sequelae of a prior infectious or inflammatory process.  Original Report Authenticated By: San Morelle, M.D.   Mr Abdomen Wo Contrast  01/04/2013   CLINICAL DATA:  Left renal mass. No IV contrast was administered due to chronic renal insufficiency  EXAM: MRI ABDOMEN WITHOUT CONTRAST  TECHNIQUE: Multiplanar, multisequence MR imaging was performed. No intravenous contrast was administered.  COMPARISON:  Ultrasound 01/02/2013  FINDINGS: There is a large rounded mass extending laterally from the mid and lower pole of the left kidney. The mass measures 8.4 x 8.9 x 9.5 cm. The mass pass encapsulating 7 mm rim on the T2 weighted images (series 4). There is extensive nodularity extending centrally from this capsulated margin. There is some high T2 signal centrally suggesting some cystic change or hemorrhage. No evidence of macroscopic fat within the lesion on the fat-suppressed T1 sequences.  The mass lesion is removed from the hilum without gross involvement of the renal vein on this noncontrast exam. The right kidney appears normal. Adrenal glands normal. No retroperitoneal adenopathy.  The liver, gallbladder, pancreas, spleen are normal. EXAM is this degraded by patient respiratory motion.  IMPRESSION: 1. 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  2. No evidence of metastasis or involvement of the renal vein this non contrast exam. If the patient is a chronic dialysis patient, a CT with IV contrast could be performed.   Electronically Signed   By: Suzy Bouchard M.D.   On: 01/04/2013 08:15   Dg Chest Port 1 View  01/03/2013   CLINICAL DATA:  Central line placement.  EXAM: PORTABLE CHEST - 1 VIEW  COMPARISON:  Earlier film of the same day  FINDINGS:  Interval placement of a right IJ central line, tip near the cavoatrial junction. No pneumothorax. Persistent left lower lung consolidation/ atelectasis. Right lung clear. Heart size upper limits normal. Regional bones unremarkable.  IMPRESSION: 1. Central line placement to cavoatrial junction without pneumothorax.   Electronically Signed   By: Arne Cleveland M.D.   On: 01/03/2013 22:35    Medications:  I have reviewed the patient's current medications. Scheduled: . feeding supplement (RESOURCE BREEZE)  1 Container Oral TID BM  . heparin subcutaneous  5,000 Units Subcutaneous Q8H  . labetalol  200 mg Oral BID  . [START ON 01/06/2013] levofloxacin (LEVAQUIN) IV  500 mg Intravenous Q48H  . sodium bicarbonate  650 mg Oral BID    Assessment/Plan: Patient continues with intermittent word finding difficulties.  Do not suspect recurrent TIA based on clinical presentation.  No evidence of acute infarct on MRI.  EEG is unremarkable.  It may very well be the case that with his concurrent illness it is unmasking some signs and symptoms referable to these white matter changes.   Recommendations: 1.  No further neurologic intervention is recommended at this time.  If further questions arise, please call or page at that time.  Thank you for allowing neurology to participate in the care of this patient.  Patient will likely benefit from an 81mg  aspirin a day at discharge.      LOS: 4 days   Alexis Goodell, MD [facility] [phone] 01/05/2013  9:14 AM

## 2013-01-05 NOTE — Progress Notes (Signed)
Assessment  1. Acute and/or chronic renal failure- only old lab is creat 1.6 form 2008, 3.93 on admission 2  Renal cystic mass, left --suspicious for malignancy (seen by Dr. Jeffie Pollock) 3. Pneumonia, urine antigen for legionella was +  4. Hypertension-  5. Altered mental status not typical of uremia, but may need to do HD  Rec-Labetolol added to prn meds, need better BP control as hypertensive encephalopathy may be a component but no obvious PRES by MRI;  may need to proceed with HD and will decide in AM if no MS improvement  Subjective: Interval History.  Some word finding issues with neg acute findings on mri and eeg per neuro  Objective: Vital signs in last 24 hours: Temp:  [93 F (33.9 C)-99.5 F (37.5 C)] 97.6 F (36.4 C) (10/28 1200) Pulse Rate:  [75-95] 83 (10/28 1100) Resp:  [13-31] 14 (10/28 1100) BP: (144-182)/(93-120) 168/111 mmHg (10/28 1200) SpO2:  [61 %-99 %] 97 % (10/28 1200) Weight:  [114.6 kg (252 lb 10.4 oz)] 114.6 kg (252 lb 10.4 oz) (10/28 0500) Weight change:   Intake/Output from previous day: 10/27 0701 - 10/28 0700 In: 2450 [P.O.:660; I.V.:1040; IV Piggyback:150] Out: 710 [Urine:710] Intake/Output this shift:    General appearance: difficulty with speech slurred Resp: clear to auscultation bilaterally Cardio: regular rate and rhythm, S1, S2 normal, no murmur, click, rub or gallop GI: soft, non-tender; bowel sounds normal; no masses,  no organomegaly Extremities: extremities normal, atraumatic, no cyanosis or edema  Lab Results:  Recent Labs  01/04/13 0530 01/05/13 0425  WBC 10.0 8.7  HGB 11.6* 12.2*  HCT 33.5* 34.0*  PLT 123* 153   BMET:  Recent Labs  01/03/13 2330 01/05/13 0425  NA 136 135  K 4.2 3.4*  CL 101 99  CO2 18* 21  GLUCOSE 123* 117*  BUN 88* 94*  CREATININE 7.61* 8.09*  CALCIUM 7.3* 7.4*    Recent Labs  01/04/13 0530  PTH 594.5*   Iron Studies: No results found for this basename: IRON, TIBC, TRANSFERRIN, FERRITIN,  in  the last 72 hours Studies/Results: Mr Brain Wo Contrast  01/03/2013   *RADIOLOGY REPORT*  Clinical Data: Found down. Loss of consciousness.  MRI HEAD WITHOUT CONTRAST  Technique:  Multiplanar, multiecho pulse sequences of the brain and surrounding structures were obtained according to standard protocol without intravenous contrast.  Comparison: CT head without contrast 01/01/2013.  Findings: Scattered periventricular and subcortical T2 hyperintensities are present bilaterally.  No acute infarct, hemorrhage, mass lesion is present.  The ventricles are normal size.  No significant extra-axial fluid collection is present.  Flow is present in the major intracranial arteries.  The globes orbits are intact.  Mild mucosal thickening is most evident in the sphenoid sinuses and ethmoid air cells.  There is some fluid in the mastoid air cells bilaterally.  No obstructing nasopharyngeal lesion is evident.  IMPRESSION:  1.  No acute intracranial abnormality. 2.  Diffuse white matter changes are markedly advanced for age. The finding is nonspecific but can be seen in the setting of chronic microvascular ischemia, a demyelinating process such as multiple sclerosis, vasculitis, complicated migraine headaches, or as the sequelae of a prior infectious or inflammatory process.   Original Report Authenticated By: San Morelle, M.D.   Mr Abdomen Wo Contrast  01/04/2013   CLINICAL DATA:  Left renal mass. No IV contrast was administered due to chronic renal insufficiency  EXAM: MRI ABDOMEN WITHOUT CONTRAST  TECHNIQUE: Multiplanar, multisequence MR imaging was performed. No intravenous  contrast was administered.  COMPARISON:  Ultrasound 01/02/2013  FINDINGS: There is a large rounded mass extending laterally from the mid and lower pole of the left kidney. The mass measures 8.4 x 8.9 x 9.5 cm. The mass pass encapsulating 7 mm rim on the T2 weighted images (series 4). There is extensive nodularity extending centrally from this  capsulated margin. There is some high T2 signal centrally suggesting some cystic change or hemorrhage. No evidence of macroscopic fat within the lesion on the fat-suppressed T1 sequences.  The mass lesion is removed from the hilum without gross involvement of the renal vein on this noncontrast exam. The right kidney appears normal. Adrenal glands normal. No retroperitoneal adenopathy.  The liver, gallbladder, pancreas, spleen are normal. EXAM is this degraded by patient respiratory motion.  IMPRESSION: 1. 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  2. No evidence of metastasis or involvement of the renal vein this non contrast exam. If the patient is a chronic dialysis patient, a CT with IV contrast could be performed.   Electronically Signed   By: Suzy Bouchard M.D.   On: 01/04/2013 08:15   Dg Chest Port 1 View  01/03/2013   CLINICAL DATA:  Central line placement.  EXAM: PORTABLE CHEST - 1 VIEW  COMPARISON:  Earlier film of the same day  FINDINGS: Interval placement of a right IJ central line, tip near the cavoatrial junction. No pneumothorax. Persistent left lower lung consolidation/ atelectasis. Right lung clear. Heart size upper limits normal. Regional bones unremarkable.  IMPRESSION: 1. Central line placement to cavoatrial junction without pneumothorax.   Electronically Signed   By: Arne Cleveland M.D.   On: 01/03/2013 22:35   Scheduled: . feeding supplement (RESOURCE BREEZE)  1 Container Oral TID BM  . heparin subcutaneous  5,000 Units Subcutaneous Q8H  . labetalol  200 mg Oral BID  . [START ON 01/06/2013] levofloxacin (LEVAQUIN) IV  500 mg Intravenous Q48H  . sodium bicarbonate  650 mg Oral BID      LOS: 4 days   Shakela Donati C 01/05/2013,12:26 PM  He he him home glenohumeral the he

## 2013-01-05 NOTE — Progress Notes (Signed)
Spoke w/Dr. Wynelle Cleveland regarding continuous elevated BP's 160s-170s/100s-110s.  Orders given and initiated.  Will also leave foley catheter in place to continue to monitor strict I/O due to renal and BP status.  Will continue to monitor for changes.

## 2013-01-06 LAB — CBC
HCT: 34.6 % — ABNORMAL LOW (ref 39.0–52.0)
Hemoglobin: 11.8 g/dL — ABNORMAL LOW (ref 13.0–17.0)
MCH: 27.8 pg (ref 26.0–34.0)
MCHC: 34.1 g/dL (ref 30.0–36.0)
RBC: 4.24 MIL/uL (ref 4.22–5.81)

## 2013-01-06 LAB — RENAL FUNCTION PANEL
Albumin: 1.6 g/dL — ABNORMAL LOW (ref 3.5–5.2)
BUN: 99 mg/dL — ABNORMAL HIGH (ref 6–23)
CO2: 21 mEq/L (ref 19–32)
Calcium: 7.5 mg/dL — ABNORMAL LOW (ref 8.4–10.5)
Chloride: 101 mEq/L (ref 96–112)
GFR calc Af Amer: 7 mL/min — ABNORMAL LOW (ref >90)
GFR calc non Af Amer: 6 mL/min — ABNORMAL LOW (ref >90)
Glucose, Bld: 108 mg/dL — ABNORMAL HIGH (ref 70–99)
Phosphorus: 8.3 mg/dL — ABNORMAL HIGH (ref 2.3–4.6)
Sodium: 139 mEq/L (ref 135–145)

## 2013-01-06 LAB — IMMUNOFIXATION ELECTROPHORESIS
IgA: 421 mg/dL — ABNORMAL HIGH (ref 68–379)
IgM, Serum: 29 mg/dL — ABNORMAL LOW (ref 41–251)
Total Protein ELP: 5.1 g/dL — ABNORMAL LOW (ref 6.0–8.3)

## 2013-01-06 LAB — ANTISTREPTOLYSIN O TITER: ASO: <25 IU/mL (ref <409)

## 2013-01-06 MED ORDER — CLONIDINE HCL 0.3 MG PO TABS
0.3000 mg | ORAL_TABLET | Freq: Two times a day (BID) | ORAL | Status: DC
  Administered 2013-01-06 – 2013-01-08 (×5): 0.3 mg via ORAL
  Filled 2013-01-06 (×7): qty 1

## 2013-01-06 NOTE — Progress Notes (Signed)
Assessment  1. Acute and/or chronic renal failure- only old lab is creat 1.6 form 2008, 3.93 on admission  2 Renal cystic mass, left --suspicious for malignancy (seen by Dr. Jeffie Pollock)  3. Pneumonia, urine antigen for legionella was +  4. Hypertension- suboptimal 5. Altered mental status not typical of uremia, but may need to do HD   Plan Increase clonidine, will follow as UOP has improved.  Subjective: Interval History: BP and MS a little better; some nausea and no appetite, improved UOP, on labetalol drip and po clonidine  Objective: Vital signs in last 24 hours: Temp:  [97.3 F (36.3 C)-97.7 F (36.5 C)] 97.3 F (36.3 C) (10/29 1132) Pulse Rate:  [63-88] 63 (10/29 1145) Resp:  [16-24] 16 (10/29 1145) BP: (124-181)/(39-120) 144/100 mmHg (10/29 1145) SpO2:  [94 %-100 %] 99 % (10/29 1145) Weight change:   Intake/Output from previous day: 10/28 0701 - 10/29 0700 In: 1079.1 [P.O.:405; I.V.:674.1] Out: 2300 [Urine:2300] Intake/Output this shift: Total I/O In: 440 [P.O.:80; I.V.:260; IV Piggyback:100] Out: -   General appearance: slowed mentation Resp: clear to auscultation bilaterally Chest wall: no tenderness Cardio: regular rate and rhythm, S1, S2 normal, no murmur, click, rub or gallop Extremities: extremities normal, atraumatic, no cyanosis, trace edema  Lab Results:  Recent Labs  01/05/13 0425 01/06/13 0430  WBC 8.7 6.8  HGB 12.2* 11.8*  HCT 34.0* 34.6*  PLT 153 203   BMET:  Recent Labs  01/05/13 0425 01/06/13 0546  NA 135 139  K 3.4* 3.7  CL 99 101  CO2 21 21  GLUCOSE 117* 108*  BUN 94* 99*  CREATININE 8.09* 8.51*  CALCIUM 7.4* 7.5*    Recent Labs  01/04/13 0530  PTH 594.5*   Iron Studies: No results found for this basename: IRON, TIBC, TRANSFERRIN, FERRITIN,  in the last 72 hours Studies/Results: No results found.  Scheduled: . cloNIDine  0.2 mg Oral BID  . feeding supplement (RESOURCE BREEZE)  1 Container Oral TID BM  . heparin  subcutaneous  5,000 Units Subcutaneous Q8H  . levofloxacin (LEVAQUIN) IV  500 mg Intravenous Q48H  . sodium bicarbonate  650 mg Oral BID     LOS: 5 days   Sundeep Cary C 01/06/2013,12:57 PM

## 2013-01-06 NOTE — Progress Notes (Signed)
Pt was started on labetalol drip last HS around 0200, pt due to receive po labetalol this am, message sent to Dr Thereasa Solo to clarify.

## 2013-01-06 NOTE — Progress Notes (Signed)
[facility] Progress Note [name] - Stepdown/ICU TEAM   Brian Lawson [id number] DOB: November 20, 1955 DOA: 01/01/2013 PCP: "[facility] " - per pt   Brief narrative: 57 y/o male who presented to ER after found on the floor by his mother. 3 days of diarrhea and cough - fever of 105 in the ER. Has a h/o Gout and HTN - has no h/o chronic renal failure as far as he knows. Last saw his doctor about 3 mo prior. Has no urinary complaints.   Assessment/Plan:  Sepsis due to PNA -supportive care -Legionella PNA and associated dehydration and ARF  Acute hypoxic respiratory failure due to Legionella PNA - changed antibiotics from vanc and Zosyn to Zithro and Rocephin to appropriately treat CAP but since Legionella positive will change to Levaquin (per UptoDate: fluoroquinolones decrease fever better and faster and also shorten hospital stays) - blood cx NGTD  ARF/metabolic acidosis/ATN - renal ultrasound- no obstruction but incidental mass- MRI confirms  - initially was oliguric so foley inserted -making urine but renal function steadily worsening - noted to be on ACE at home  - Nephro following- felt 2/2 ATN so hydration continued and bicarb gtt started 10/27 but as of today renal function has worsened and concerned he may need HD this admit. -10/28: Nephro slowed IVFs and changing to PO bicarb but still renal function worsens so ?? 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 -Dr. Jeffie Pollock recommends outpt f/u with resection when more stable medically   Slurred speech/metabolic encephalopathy  - neuro eval much appreciated - MRI head without acute changes -EEG pending -likely due to progressive azotemia since has progressively worsened as renal function has worsened    GOUT - cont Allopurinol    HYPERTENSION - now 8.7 L positive  - suspect he will need dialysis to remove excess fluid- not diuresing as  well as he should be -Because was not under control he was transitioned to Labetalol gtt 10/28 therefore will dc PO Labetalol but will continue Clonidine  Thrombocytopenia - likely due to sepsis - follow  Diarrhea due to legionella - none here so far - will d/c stool studies   Code Status: FULL Family Communication: no family present at time of exam Disposition Plan: SDU  Consultants: Nephrology Urology Neurology  Procedures: EEG pending  Antibiotics: Vanc/ Zosyn 10/25 >>> 10/26 Levaquin 10/27 >>> Zithromax 10/25 >>> 10/27 Rocephin 10/25 >>> 10/26  DVT prophylaxis: SQ heparin  HPI/Subjective: Pt lethargic but states prior diffuse myalgias have resolved-still poor appetite.  Is mildly confused.  Objective: Blood pressure 144/100, pulse 63, temperature 97.3 F (36.3 C), temperature source Oral, resp. rate 16, height 6\' 3"  (1.905 m), weight 114.6 kg (252 lb 10.4 oz), SpO2 99.00%.  Intake/Output Summary (Last 24 hours) at 01/06/13 1214 Last data filed at 01/06/13 1100  Gross per 24 hour  Intake 1259.13 ml  Output   1750 ml  Net -490.87 ml    Exam: General: answering questions appropriately - no acute respiratory distress Lungs: crackles in LLL, RA Cardiovascular: Regular rate and rhythm without murmur gallop or rub normal S1 and S2 Abdomen: Nontender, nondistended, soft, bowel sounds positive, no rebound, no ascites, no appreciable mass Extremities: No significant cyanosis, clubbing, or edema bilateral lower extremities  Data Reviewed: Basic Metabolic Panel:  Recent Labs Lab 01/02/13 0509 01/03/13 0450 01/03/13 2330 01/05/13 0425 01/06/13 0546  NA 138 135 136 135 139  K  4.5 4.6 4.2 3.4* 3.7  CL 104 100 101 99 101  CO2 20 18* 18* 21 21  GLUCOSE 122* 95 123* 117* 108*  BUN 59* 80* 88* 94* 99*  CREATININE 4.67* 6.49* 7.61* 8.09* 8.51*  CALCIUM 8.5 7.8* 7.3* 7.4* 7.5*  PHOS  --   --   --  6.8* 8.3*   Liver Function Tests:  Recent Labs Lab  01/01/13 1859 01/05/13 0425 01/06/13 0546  AST 66*  --   --   ALT 29  --   --   ALKPHOS 60  --   --   BILITOT 0.9  --   --   PROT 8.4*  --   --   ALBUMIN 3.0* 1.6* 1.6*   No results found for this basename: LIPASE, AMYLASE,  in the last 168 hours No results found for this basename: AMMONIA,  in the last 168 hours  CBC:  Recent Labs Lab 01/01/13 1859 01/02/13 0509 01/03/13 0450 01/04/13 0530 01/05/13 0425 01/06/13 0430  WBC 12.0* 11.7* 12.1* 10.0 8.7 6.8  NEUTROABS 10.2*  --   --   --   --   --   HGB 15.4 14.9 13.2 11.6* 12.2* 11.8*  HCT 45.7 43.6 38.8* 33.5* 34.0* 34.6*  MCV 84.0 84.7 82.4 81.9 81.1 81.6  PLT 112* 100* 107* 123* 153 203   Cardiac Enzymes:  Recent Labs Lab 01/03/13 2330 01/04/13 0530  CKTOTAL 637* 526*    Recent Results (from the past 240 hour(s))  CULTURE, BLOOD (ROUTINE X 2)     Status: None   Collection Time    01/01/13  8:30 PM      Result Value Range Status   Specimen Description BLOOD RIGHT ARM   Final   Special Requests BOTTLES DRAWN AEROBIC AND ANAEROBIC 10CC   Final   Culture  Setup Time     Final   Value: 01/02/2013 01:13     Performed at [facility]   Culture     Final   Value:        BLOOD CULTURE RECEIVED NO GROWTH TO DATE CULTURE WILL BE HELD FOR 5 DAYS BEFORE ISSUING A FINAL NEGATIVE REPORT     Performed at [facility]   Report Status PENDING   Incomplete  CULTURE, BLOOD (ROUTINE X 2)     Status: None   Collection Time    01/01/13  8:45 PM      Result Value Range Status   Specimen Description BLOOD RIGHT HAND   Final   Special Requests BOTTLES DRAWN AEROBIC AND ANAEROBIC 10CC   Final   Culture  Setup Time     Final   Value: 01/02/2013 01:41     Performed at [facility]   Culture     Final   Value:        BLOOD CULTURE RECEIVED NO GROWTH TO DATE CULTURE WILL BE HELD FOR 5 DAYS BEFORE ISSUING A FINAL NEGATIVE REPORT     Performed at [facility]   Report Status PENDING   Incomplete  MRSA  PCR SCREENING     Status: None   Collection Time    01/01/13 10:25 PM      Result Value Range Status   MRSA by PCR NEGATIVE  NEGATIVE Final   Comment:            The GeneXpert MRSA Assay (FDA     approved for NASAL specimens     only), is one  component of a     comprehensive MRSA colonization     surveillance program. It is not     intended to diagnose MRSA     infection nor to guide or     monitor treatment for     MRSA infections.     Studies:  Recent x-ray studies have been reviewed in detail by the Attending Physician  Scheduled Meds:  Scheduled Meds: . cloNIDine  0.2 mg Oral BID  . feeding supplement (RESOURCE BREEZE)  1 Container Oral TID BM  . heparin subcutaneous  5,000 Units Subcutaneous Q8H  . levofloxacin (LEVAQUIN) IV  500 mg Intravenous Q48H  . sodium bicarbonate  650 mg Oral BID   Time spent on care of this patient: 35 min   ELLIS,ALLISON L., ANP  [facility] Office  [phone] Pager - Text Page per Shea Evans as per below:  On-Call/Text Page:      [email or web address]      password [email or web address]  If 7PM-7AM, please contact night-coverage [email or web address] Password [email or web address] 01/06/2013, 12:14 PM   LOS: 5 days   I have personally examined this patient and reviewed the entire database. I have reviewed the above note, made any necessary editorial changes, and agree with its content.  Cherene Altes, MD [facility]

## 2013-01-07 LAB — CBC
HCT: 33.9 % — ABNORMAL LOW (ref 39.0–52.0)
Hemoglobin: 11.7 g/dL — ABNORMAL LOW (ref 13.0–17.0)
MCHC: 34.5 g/dL (ref 30.0–36.0)
MCV: 80.1 fL (ref 78.0–100.0)
RDW: 16.9 % — ABNORMAL HIGH (ref 11.5–15.5)
WBC: 5.4 10*3/uL (ref 4.0–10.5)

## 2013-01-07 LAB — RENAL FUNCTION PANEL
Albumin: 1.7 g/dL — ABNORMAL LOW (ref 3.5–5.2)
BUN: 99 mg/dL — ABNORMAL HIGH (ref 6–23)
Calcium: 7.6 mg/dL — ABNORMAL LOW (ref 8.4–10.5)
Chloride: 101 mEq/L (ref 96–112)
Creatinine, Ser: 8.39 mg/dL — ABNORMAL HIGH (ref 0.50–1.35)
Glucose, Bld: 118 mg/dL — ABNORMAL HIGH (ref 70–99)
Potassium: 3.7 mEq/L (ref 3.5–5.1)
Sodium: 138 mEq/L (ref 135–145)

## 2013-01-07 MED ORDER — INFLUENZA VAC SPLIT QUAD 0.5 ML IM SUSP
0.5000 mL | INTRAMUSCULAR | Status: DC | PRN
  Filled 2013-01-07: qty 0.5

## 2013-01-07 MED ORDER — LEVOFLOXACIN 500 MG PO TABS
500.0000 mg | ORAL_TABLET | ORAL | Status: DC
  Administered 2013-01-08 – 2013-01-16 (×5): 500 mg via ORAL
  Filled 2013-01-07 (×5): qty 1

## 2013-01-07 MED ORDER — CALCIUM ACETATE 667 MG PO CAPS
1334.0000 mg | ORAL_CAPSULE | Freq: Three times a day (TID) | ORAL | Status: DC
  Administered 2013-01-07 – 2013-01-17 (×26): 1334 mg via ORAL
  Administered 2013-01-17: 667 mg via ORAL
  Administered 2013-01-17 – 2013-01-25 (×21): 1334 mg via ORAL
  Filled 2013-01-07 (×57): qty 2

## 2013-01-07 MED ORDER — FUROSEMIDE 80 MG PO TABS
80.0000 mg | ORAL_TABLET | Freq: Two times a day (BID) | ORAL | Status: DC
  Administered 2013-01-07 – 2013-01-09 (×5): 80 mg via ORAL
  Filled 2013-01-07 (×7): qty 1

## 2013-01-07 MED ORDER — PNEUMOCOCCAL VAC POLYVALENT 25 MCG/0.5ML IJ INJ
0.5000 mL | INJECTION | INTRAMUSCULAR | Status: DC | PRN
  Filled 2013-01-07: qty 0.5

## 2013-01-07 MED ORDER — WHITE PETROLATUM GEL: Status: DC | PRN

## 2013-01-07 MED ORDER — NITROGLYCERIN 2 % TD OINT
1.0000 [in_us] | TOPICAL_OINTMENT | Freq: Four times a day (QID) | TRANSDERMAL | Status: DC
  Administered 2013-01-07 – 2013-01-09 (×8): 1 [in_us] via TOPICAL
  Filled 2013-01-07: qty 30

## 2013-01-07 NOTE — Progress Notes (Signed)
PHARMACIST - PHYSICIAN COMMUNICATION DR:   Thereasa Solo CONCERNING: Antibiotic IV to Oral Route Change Policy  RECOMMENDATION: This patient is receiving Levaquin by the intravenous route.  Based on criteria approved by the Pharmacy and Therapeutics Committee, the antibiotic(s) is/are being converted to the equivalent oral dose form(s).   DESCRIPTION: These criteria include:  Patient being treated for a respiratory tract infection, urinary tract infection, cellulitis or clostridium difficile associated diarrhea if on metronidazole  The patient is not neutropenic and does not exhibit a GI malabsorption state  The patient is eating (either orally or via tube) and/or has been taking other orally administered medications for a least 24 hours  The patient is improving clinically and has a Tmax < 100.5  If you have questions about this conversion, please contact the Pharmacy Department  []   [phone] )  [facility] [x]   [phone] )  [facility]  []   [phone] )  [facility] []   [phone] )  [facility]   Thank you, Hildred Laser, [place] D 01/07/2013 8:03 AM

## 2013-01-07 NOTE — Progress Notes (Signed)
Assessment  1. Acute and/or chronic renal failure- only old lab is creat 1.6 form 2008, 3.93 on admission  2 Renal cystic mass, left --suspicious for malignancy (seen by Dr. Jeffie Pollock)  3. Pneumonia, urine antigen for legionella was +  4. Hypertension- suboptimal  5. Altered mental status not typical of uremia, but may need to do  6. Proteinuria/ hypoalbuminemia Plan: Begin furosemide, get out of bed, 24 hr urine protein, if nephrotic will plan Bx  Subjective: Interval History: Increased UOP  Objective: Vital signs in last 24 hours: Temp:  [97.3 F (36.3 C)-97.7 F (36.5 C)] 97.3 F (36.3 C) (10/30 0819) Pulse Rate:  [59-72] 63 (10/30 0915) Resp:  [14-34] 15 (10/30 0915) BP: (131-162)/(39-115) 159/103 mmHg (10/30 0915) SpO2:  [95 %-100 %] 96 % (10/30 0915) Weight change:   Intake/Output from previous day: 10/29 0701 - 10/30 0700 In: 1860 [P.O.:200; I.V.:1560; IV Piggyback:100] Out: 2340 [Urine:2340] Intake/Output this shift: Total I/O In: 90 [I.V.:90] Out: 375 [Urine:375]  General appearance: alert, cooperative and flat affect Resp: clear to auscultation bilaterally Cardio: regular rate and rhythm, S1, S2 normal, no murmur, click, rub or gallop GI: soft, non-tender; bowel sounds normal; no masses,  no organomegaly Extremities: edema 1=  Lab Results:  Recent Labs  01/06/13 0430 01/07/13 0420  WBC 6.8 5.4  HGB 11.8* 11.7*  HCT 34.6* 33.9*  PLT 203 265   BMET:  Recent Labs  01/06/13 0546 01/07/13 0420  NA 139 138  K 3.7 3.7  CL 101 101  CO2 21 22  GLUCOSE 108* 118*  BUN 99* 99*  CREATININE 8.51* 8.39*  CALCIUM 7.5* 7.6*   No results found for this basename: PTH,  in the last 72 hours Iron Studies: No results found for this basename: IRON, TIBC, TRANSFERRIN, FERRITIN,  in the last 72 hours Studies/Results: No results found.  Scheduled: . cloNIDine  0.3 mg Oral BID  . feeding supplement (RESOURCE BREEZE)  1 Container Oral TID BM  . heparin subcutaneous   5,000 Units Subcutaneous Q8H  . [START ON 01/08/2013] levofloxacin  500 mg Oral Q48H  . sodium bicarbonate  650 mg Oral BID      LOS: 6 days   Louna Rothgeb C 01/07/2013,9:37 AM

## 2013-01-07 NOTE — Progress Notes (Signed)
[facility] Progress Note  TEAM 1 - Stepdown/ICU TEAM   Brian Lawson [id number] DOB: 03-12-1955 DOA: 01/01/2013 PCP: "[facility] on [facility] " - per pt   Brief narrative: 57 y/o male who presented to ER after found on the floor by his mother. 3 days of diarrhea and cough - fever of 105 in the ER. Has a h/o Gout and HTN - has no h/o chronic renal failure as far as he knows. Last saw his doctor about 3 mo prior. Has no urinary complaints.   Assessment/Plan:  Sepsis due to PNA -supportive care -Legionella PNA and associated dehydration from diarrhea and poor PO inake and ARF  Acute hypoxic respiratory failure due to Legionella PNA -resolved - changed antibiotics from vanc and Zosyn to Zithro and Rocephin to appropriately treat CAP but since Legionella positive was changed to Levaquin (per UptoDate: fluoroquinolones decrease fever better and faster and also shorten hospital stays) - blood cx NGTD  ARF/metabolic acidosis/ATN - renal ultrasound- no obstruction but incidental mass found- MRI confirmed with concerns for malignancy (see below) - initially was oliguric so foley inserted - noted to be on ACE I at home  - Nephro following- felt 2/2 ATN so hydration was initially continued and bicarb gtt started 10/27 but by 10/28 renal function had worsened and the possibility of HD this admission was entertained. 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 -Dr. Jeffie Pollock with urology recommended outpt f/u with resection when more stable medically   Slurred speech/metabolic encephalopathy  - neuro eval much appreciated - MRI head without acute changes -EEG results still epnding -likely due to progressive azotemia since has  progressively worsened as renal function has worsened    GOUT - cont Allopurinol    HYPERTENSION -has been slowly auto diuresing with resultant decrease in BP (see above) -Because was not under control he was transitioned to Labetalol gtt 10/28 and PO Labetalol was dc'd but Clonidine was continued -10/30: Add NTP  Thrombocytopenia - likely due to sepsis - follow  Diarrhea due to legionella - none here so far   Code Status: FULL Family Communication: no family present at time of exam Disposition Plan: SDU  Consultants: Nephrology Urology Neurology  Procedures: EEG pending  Antibiotics: Vanc/ Zosyn 10/25 >>> 10/26 Levaquin 10/27 >>> Zithromax 10/25 >>> 10/27 Rocephin 10/25 >>> 10/26  DVT prophylaxis: SQ heparin  HPI/Subjective: Pt with persistent anorexia, denies SOB.  Objective: Blood pressure 152/102, pulse 64, temperature 97.3 F (36.3 C), temperature source Oral, resp. rate 18, height 6\' 3"  (1.905 m), weight 114.6 kg (252 lb 10.4 oz), SpO2 98.00%.  Intake/Output Summary (Last 24 hours) at 01/07/13 1232 Last data filed at 01/07/13 [id number]  Gross per 24 hour  Intake   1510 ml  Output   2715 ml  Net  -1205 ml    Exam: General: answering questions appropriately - no acute respiratory distress Lungs: crackles in LLL, RA Cardiovascular: Regular rate and rhythm without murmur gallop or rub normal S1 and S2 Abdomen: Nontender, nondistended, soft, bowel sounds positive, no rebound, no ascites, no appreciable mass Extremities: No significant cyanosis, clubbing, or edema bilateral lower extremities  Data Reviewed: Basic Metabolic Panel:  Recent Labs Lab 01/03/13 0450 01/03/13 2330 01/05/13 0425 01/06/13 0546 01/07/13 [id number]  NA 135 136 135 139 138  K 4.6 4.2 3.4* 3.7 3.7  CL 100 101 99 101 101  CO2 18* 18* 21 21 22   GLUCOSE 95 123* 117* 108* 118*  BUN 80* 88* 94* 99* 99*  CREATININE 6.49* 7.61* 8.09* 8.51* 8.39*  CALCIUM 7.8* 7.3* 7.4* 7.5* 7.6*  PHOS   --   --  6.8* 8.3* 7.2*   Liver Function Tests:  Recent Labs Lab 01/01/13 1859 01/05/13 0425 01/06/13 0546 01/07/13 0420  AST 66*  --   --   --   ALT 29  --   --   --   ALKPHOS 60  --   --   --   BILITOT 0.9  --   --   --   PROT 8.4*  --   --   --   ALBUMIN 3.0* 1.6* 1.6* 1.7*   No results found for this basename: LIPASE, AMYLASE,  in the last 168 hours No results found for this basename: AMMONIA,  in the last 168 hours  CBC:  Recent Labs Lab 01/01/13 1859  01/03/13 0450 01/04/13 0530 01/05/13 0425 01/06/13 0430 01/07/13 0420  WBC 12.0*  < > 12.1* 10.0 8.7 6.8 5.4  NEUTROABS 10.2*  --   --   --   --   --   --   HGB 15.4  < > 13.2 11.6* 12.2* 11.8* 11.7*  HCT 45.7  < > 38.8* 33.5* 34.0* 34.6* 33.9*  MCV 84.0  < > 82.4 81.9 81.1 81.6 80.1  PLT 112*  < > 107* 123* 153 203 265  < > = values in this interval not displayed. Cardiac Enzymes:  Recent Labs Lab 01/03/13 2330 01/04/13 0530  CKTOTAL 637* 526*    Recent Results (from the past 240 hour(s))  CULTURE, BLOOD (ROUTINE X 2)     Status: None   Collection Time    01/01/13  8:30 PM      Result Value Range Status   Specimen Description BLOOD RIGHT ARM   Final   Special Requests BOTTLES DRAWN AEROBIC AND ANAEROBIC 10CC   Final   Culture  Setup Time     Final   Value: 01/02/2013 01:13     Performed at [facility]   Culture     Final   Value:        BLOOD CULTURE RECEIVED NO GROWTH TO DATE CULTURE WILL BE HELD FOR 5 DAYS BEFORE ISSUING A FINAL NEGATIVE REPORT     Performed at [facility]   Report Status PENDING   Incomplete  CULTURE, BLOOD (ROUTINE X 2)     Status: None   Collection Time    01/01/13  8:45 PM      Result Value Range Status   Specimen Description BLOOD RIGHT HAND   Final   Special Requests BOTTLES DRAWN AEROBIC AND ANAEROBIC 10CC   Final   Culture  Setup Time     Final   Value: 01/02/2013 01:41     Performed at [facility]   Culture     Final   Value:        BLOOD  CULTURE RECEIVED NO GROWTH TO DATE CULTURE WILL BE HELD FOR 5 DAYS BEFORE ISSUING A FINAL NEGATIVE REPORT     Performed at [facility]   Report Status PENDING   Incomplete  MRSA PCR SCREENING     Status: None   Collection Time    01/01/13 10:25 PM  Result Value Range Status   MRSA by PCR NEGATIVE  NEGATIVE Final   Comment:            The GeneXpert MRSA Assay (FDA     approved for NASAL specimens     only), is one component of a     comprehensive MRSA colonization     surveillance program. It is not     intended to diagnose MRSA     infection nor to guide or     monitor treatment for     MRSA infections.     Studies:  Recent x-ray studies have been reviewed in detail by the Attending Physician  Scheduled Meds:  Scheduled Meds: . calcium acetate  1,334 mg Oral TID WC  . cloNIDine  0.3 mg Oral BID  . feeding supplement (RESOURCE BREEZE)  1 Container Oral TID BM  . furosemide  80 mg Oral BID  . heparin subcutaneous  5,000 Units Subcutaneous Q8H  . [START ON 01/08/2013] levofloxacin  500 mg Oral Q48H  . nitroGLYCERIN  1 inch Topical Q6H  . sodium bicarbonate  650 mg Oral BID   Time spent on care of this patient: 35 min   ELLIS,ALLISON L., ANP  [facility] Office  [phone] Pager - Text Page per Shea Evans as per below:  On-Call/Text Page:      [email or web address]      password [email or web address]  If 7PM-7AM, please contact night-coverage [email or web address] Password [email or web address] 01/07/2013, 12:32 PM   LOS: 6 days   I have examined the patient, reviewed the chart and modified the above note which I agree with.   [id number] 01/07/2013, 3:41 PM

## 2013-01-08 LAB — CREATININE, URINE, 24 HOUR
Collection Interval-UCRE24: 24 hours
Creatinine, 24H Ur: 1352 mg/d (ref 800–2000)
Urine Total Volume-UCRE24: 4675 mL

## 2013-01-08 LAB — RENAL FUNCTION PANEL
BUN: 105 mg/dL — ABNORMAL HIGH (ref 6–23)
CO2: 24 mEq/L (ref 19–32)
Calcium: 8.2 mg/dL — ABNORMAL LOW (ref 8.4–10.5)
Creatinine, Ser: 8.51 mg/dL — ABNORMAL HIGH (ref 0.50–1.35)
GFR calc Af Amer: 7 mL/min — ABNORMAL LOW (ref >90)
Glucose, Bld: 104 mg/dL — ABNORMAL HIGH (ref 70–99)
Phosphorus: 6.8 mg/dL — ABNORMAL HIGH (ref 2.3–4.6)

## 2013-01-08 LAB — CULTURE, BLOOD (ROUTINE X 2): Culture: NO GROWTH

## 2013-01-08 LAB — PROTEIN, URINE, 24 HOUR
Collection Interval-UPROT: 24 hours
Urine Total Volume-UPROT: 4675 mL

## 2013-01-08 MED ORDER — VITAMIN D (ERGOCALCIFEROL) 1.25 MG (50000 UNIT) PO CAPS
50000.0000 [IU] | ORAL_CAPSULE | ORAL | Status: DC
  Administered 2013-01-08 – 2013-01-22 (×3): 50000 [IU] via ORAL
  Filled 2013-01-08 (×3): qty 1

## 2013-01-08 NOTE — Consult Note (Signed)
Urology follow-up note  We are following Brian Lawson for a large left cystic renal mass found incidentally.  He continues to be hospitalized for legionella PNA and renal failure.     Intv: - creatinine remains elevated, despite aggressive resuscitation  - currently undergoing 24 hour urine collection - remains on Levoquin for PNA. - albumin 1.9  Filed Vitals:   01/08/13 0700 01/08/13 0828 01/08/13 0911 01/08/13 1200  BP: 140/90  170/100 166/105  Pulse: 59   59  Temp:  97.4 F (36.3 C)  97.4 F (36.3 C)  TempSrc:  Oral  Oral  Resp: 15   14  Height:      Weight:      SpO2: 97%   97%    Intake/Output Summary (Last 24 hours) at 01/08/13 1405 Last data filed at 01/08/13 1000  Gross per 24 hour  Intake 1317.13 ml  Output   3500 ml  Net -2182.87 ml   Patient alert and oriented x 3   Recent Labs  01/06/13 0430 01/07/13 0420  WBC 6.8 5.4  HGB 11.8* 11.7*  HCT 34.6* 33.9*    Recent Labs  01/06/13 0546 01/07/13 0420 01/08/13 0438  NA 139 138 143  K 3.7 3.7 3.8  CL 101 101 104  CO2 21 22 24   GLUCOSE 108* 118* 104*  BUN 99* 99* 105*  CREATININE 8.51* 8.39* 8.51*  CALCIUM 7.5* 7.6* 8.2*   No results found for this basename: LABPT, INR,  in the last 72 hours No results found for this basename: LABURIN,  in the last 72 hours Results for orders placed during the hospital encounter of 01/01/13  CULTURE, BLOOD (ROUTINE X 2)     Status: None   Collection Time    01/01/13  8:30 PM      Result Value Range Status   Specimen Description BLOOD RIGHT ARM   Final   Special Requests BOTTLES DRAWN AEROBIC AND ANAEROBIC 10CC   Final   Culture  Setup Time     Final   Value: 01/02/2013 01:13     Performed at [facility]     Final   Value: NO GROWTH 5 DAYS     Performed at [facility]   Report Status 01/08/2013 FINAL   Final  CULTURE, BLOOD (ROUTINE X 2)     Status: None   Collection Time    01/01/13  8:45 PM      Result Value Range Status   Specimen Description BLOOD RIGHT HAND   Final   Special Requests BOTTLES DRAWN AEROBIC AND ANAEROBIC 10CC   Final   Culture  Setup Time     Final   Value: 01/02/2013 01:41     Performed at [facility]   Culture     Final   Value: NO GROWTH 5 DAYS     Performed at [facility]   Report Status 01/08/2013 FINAL   Final  MRSA PCR SCREENING     Status: None   Collection Time    01/01/13 10:25 PM      Result Value Range Status   MRSA by PCR NEGATIVE  NEGATIVE Final   Comment:            The GeneXpert MRSA Assay (FDA     approved for NASAL specimens     only), is one component of a     comprehensive MRSA colonization     surveillance program. It is not  intended to diagnose MRSA     infection nor to guide or     monitor treatment for     MRSA infections.    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  No renal vein involvement. No evidence of metastatic disease.  Imp/Rec: I stopped by to introduce myself and briefly discuss the treatment for his left renal mass.  I told him that before we can do anything his kidney function will need to return to at/or near baseline and he's nutritional status will need to improve.  I told him we'd likely be able to do this laparoscopically but we'd discuss it further with him in our office after he's been discharged.    He should be scheduled for follow-up with me 10-14 days after discharge for surgical planning.  [phone]

## 2013-01-08 NOTE — Evaluation (Signed)
Physical Therapy Evaluation Patient Details Name: Brian Lawson MRN: [id number] DOB: 01/04/1956 Today's Date: 01/08/2013 Time: [id number] PT Time Calculation (min): 20 min  PT Assessment / Plan / Recommendation History of Present Illness  57 y/o male who presented to ER after found on the floor by his mother. 3 days of diarrhea and cough - fever of 105 in the ER. Has a h/o Gout and HTN . Pt with legnionella PNA, severe sepsis, transient blindness, HTN, encephalopathy  Clinical Impression  Pt with decreased mental status, function and balance with below deficits who will benefit from acute therapy to maximize mobility, balance and activity tolerance prior to discharge to decrease burden of care and return pt to PLOF. Pt with reported dizziness and nausea with sitting with maintained HTN. Pt reports he has had vertigo in the past and symptoms similar. Pt able to perform visual tracking without difficulty or reproduction of symptoms, however does report some dizziness with head turns but no nystagmus and unable to tolerate further vestibular screening at this time.     PT Assessment  Patient needs continued PT services    Follow Up Recommendations  CIR;Supervision/Assistance - 24 hour    Does the patient have the potential to tolerate intense rehabilitation      Barriers to Discharge Decreased caregiver support mother able to provide 24hr supervision but only limited assist    Equipment Recommendations  Other (comment);Rolling walker with 5" wheels (potential RW to be determined with progression)    Recommendations for Other Services OT consult   Frequency Min 3X/week    Precautions / Restrictions Precautions Precautions: Fall Restrictions Weight Bearing Restrictions: No   Pertinent Vitals/Pain 92% on RA 63 HR BP 140-90 (103) supine 158/101 (119) sitting 149/106 (120) in chair      Mobility  Bed Mobility Bed Mobility: Supine to Sit Supine to Sit: 4: Min assist;HOB  elevated;With rails Details for Bed Mobility Assistance: cueing for sequence with HHA to elevate trunk x 2 trials secondary to pt falling back onto bed with intial sitting due to dizziness and decreased speed of transfer second trial Transfers Transfers: Sit to Stand;Stand to Sit;Stand Pivot Transfers Sit to Stand: 1: +2 Total assist;From bed Sit to Stand: Patient Percentage: 60% Stand to Sit: 1: +2 Total assist;To chair/3-in-1 Stand to Sit: Patient Percentage: 60% Stand Pivot Transfers: 1: +2 Total assist Stand Pivot Transfers: Patient Percentage: 70% Details for Transfer Assistance: pt with 2 trials prior to achieving standing due to knee buckling and pt reporting he can't support himself. Once standing able to pivot to chair with total assist needed to control pelvis to chair and decreased control of descent and pt not holding onto RW or therapist with pivot despite cueing  Ambulation/Gait Ambulation/Gait Assistance: Not tested (comment)    Exercises     PT Diagnosis: Difficulty walking;Altered mental status  PT Problem List: Decreased strength;Decreased activity tolerance;Decreased balance;Decreased mobility;Cardiopulmonary status limiting activity;Decreased safety awareness;Decreased knowledge of use of DME PT Treatment Interventions: Gait training;DME instruction;Stair training;Functional mobility training;Therapeutic activities;Therapeutic exercise;Patient/family education;Cognitive remediation;Balance training     PT Goals(Current goals can be found in the care plan section) Acute Rehab PT Goals Patient Stated Goal: be able to return to school PT Goal Formulation: With patient Time For Goal Achievement: 01/22/13 Potential to Achieve Goals: Good  Visit Information  Last PT Received On: 01/08/13 Assistance Needed: +2 (safety with mobility) History of Present Illness: 57 y/o male who presented to ER after found on the floor by his mother. 3 days  of diarrhea and cough - fever of  105 in the ER. Has a h/o Gout and HTN . 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 Communication: No difficulties    Cognition  Cognition Arousal/Alertness: Awake/alert Behavior During Therapy: Flat affect Overall Cognitive Status: Impaired/Different from baseline Area of Impairment: Memory;Safety/judgement;Following commands Following Commands: Follows one step commands with increased time Safety/Judgement: Decreased awareness of deficits General Comments: Pt with difficulty recalling day of week    Extremity/Trunk Assessment Upper Extremity Assessment Upper Extremity Assessment: Defer to OT evaluation Lower Extremity Assessment Lower Extremity Assessment: Generalized weakness Cervical / Trunk Assessment Cervical / Trunk Assessment: Normal   Balance Balance Balance Assessed: Yes Static Sitting Balance Static Sitting - Balance Support: Bilateral upper extremity supported;Feet supported Static Sitting - Level of Assistance: 4: Min assist Static Sitting - Comment/# of Minutes: 5 Static Standing Balance Static Standing - Balance Support: No upper extremity supported Static Standing - Level of Assistance: 3: Mod assist Static Standing - Comment/# of Minutes: 1  End of Session PT - End of Session Equipment Utilized During Treatment: Gait belt Activity Tolerance: Patient limited by fatigue Patient left: in chair;with call bell/phone within reach Nurse Communication: Mobility status  GP     Melford Aase 01/08/2013,  10:19 AM Elwyn Reach, [place]

## 2013-01-08 NOTE — Progress Notes (Addendum)
[facility] Progress Note [place] TEAM 1 - Stepdown/ICU TEAM   Brian Lawson [id number] DOB: 1955-10-04 DOA: 01/01/2013 PCP: "[facility] " - per pt   Brief narrative: 57 y/o male who presented to ER after found on the floor by his mother. 3 days of diarrhea and cough - fever of 105 in the ER. Has a h/o Gout and HTN - has no h/o chronic renal failure as far as he knows. Last saw his doctor about 3 mo prior. Has no urinary complaints.   Assessment/Plan:  Sepsis due to PNA - resolved -supportive care -Legionella PNA and associated dehydration from diarrhea and poor PO inake and ARF  Acute hypoxic respiratory failure due to Legionella PNA - resolved - blood cx no growth  - Levaquin to complete 10 days of tx   ARF/metabolic acidosis/ATN - renal ultrasound revealed no obstruction but incidental mass found - MRI confirmed with concerns for malignancy (see below) - initially was oliguric so foley inserted - noted to be on ACE I at home  - Nephro felt 2/2 ATN so hydration was initially continued and bicarb gtt started 10/27 and 10/28 due to worsened renal function and decreased UOP IVFs were dc'd and bicarb was changed to PO  - the possibility of HD this admission was entertained. 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 -Dr. Jeffie Pollock with urology recommended outpt f/u with resection when more stable medically   Slurred speech / metabolic encephalopathy  - neuro eval much appreciated - MRI head without acute changes - EEG results still epnding - likely due to progressive azotemia since has progressively worsened as renal function has worsened  GOUT - cont Allopurinol  HYPERTENSION -Not yet  controlled therefore blood pressure medication to be further titrated today  Thrombocytopenia - likely due to sepsis - now resolved   Diarrhea due to legionella - appears to have resolved   Code Status: FULL Family Communication: no family present at time of exam Disposition Plan: SDU  Consultants: Nephrology Urology Neurology  Procedures: EEG pending  Antibiotics: Vanc/ Zosyn 10/25 >>> 10/26 Levaquin 10/27 >>> Zithromax 10/25 >>> 10/27 Rocephin 10/25 >>> 10/26  DVT prophylaxis: SQ heparin  HPI/Subjective: Pt more alert today - frustrated over lack of progress. No SOB.  Objective: Blood pressure 170/100, pulse 59, temperature 97.4 F (36.3 C), temperature source Oral, resp. rate 15, height 6\' 3"  (1.905 m), weight 106.6 kg (235 lb 0.2 oz), SpO2 97.00%.  Intake/Output Summary (Last 24 hours) at 01/08/13 1224 Last data filed at 01/08/13 1000  Gross per 24 hour  Intake 1507.13 ml  Output   3500 ml  Net -1992.87 ml    Exam: General: answering questions appropriately - no acute respiratory distress Lungs: CTA, RA Cardiovascular: Regular rate and rhythm without murmur gallop or rub normal S1 and S2 Abdomen: Nontender, nondistended, soft, bowel sounds positive, no rebound, no ascites, no appreciable mass Extremities: No significant cyanosis, clubbing, or some edema bilateral lower extremities/nonpitting  Data Reviewed: Basic Metabolic Panel:  Recent Labs Lab 01/03/13 2330 01/05/13 0425 01/06/13 0546 01/07/13 0420 01/08/13 0438  NA 136 135 139 138 143  K 4.2 3.4* 3.7 3.7 3.8  CL 101 99 101 101 104  CO2 18* 21 21  22 24  GLUCOSE 123* 117* 108* 118* 104*  BUN 88* 94* 99* 99* 105*  CREATININE 7.61* 8.09* 8.51* 8.39* 8.51*  CALCIUM 7.3* 7.4* 7.5* 7.6* 8.2*  PHOS  --  6.8* 8.3* 7.2* 6.8*   Liver Function Tests:  Recent Labs Lab 01/01/13 1859 01/05/13 0425 01/06/13 0546 01/07/13 0420 01/08/13 0438  AST 66*  --   --   --   --   ALT 29  --   --   --   --    ALKPHOS 60  --   --   --   --   BILITOT 0.9  --   --   --   --   PROT 8.4*  --   --   --   --   ALBUMIN 3.0* 1.6* 1.6* 1.7* 1.9*    CBC:  Recent Labs Lab 01/01/13 1859  01/03/13 0450 01/04/13 0530 01/05/13 0425 01/06/13 0430 01/07/13 0420  WBC 12.0*  < > 12.1* 10.0 8.7 6.8 5.4  NEUTROABS 10.2*  --   --   --   --   --   --   HGB 15.4  < > 13.2 11.6* 12.2* 11.8* 11.7*  HCT 45.7  < > 38.8* 33.5* 34.0* 34.6* 33.9*  MCV 84.0  < > 82.4 81.9 81.1 81.6 80.1  PLT 112*  < > 107* 123* 153 203 265  < > = values in this interval not displayed. Cardiac Enzymes:  Recent Labs Lab 01/03/13 2330 01/04/13 0530  CKTOTAL 637* 526*    Recent Results (from the past 240 hour(s))  CULTURE, BLOOD (ROUTINE X 2)     Status: None   Collection Time    01/01/13  8:30 PM      Result Value Range Status   Specimen Description BLOOD RIGHT ARM   Final   Special Requests BOTTLES DRAWN AEROBIC AND ANAEROBIC 10CC   Final   Culture  Setup Time     Final   Value: 01/02/2013 01:13     Performed at [facility]   Culture     Final   Value: NO GROWTH 5 DAYS     Performed at [facility]   Report Status 01/08/2013 FINAL   Final  CULTURE, BLOOD (ROUTINE X 2)     Status: None   Collection Time    01/01/13  8:45 PM      Result Value Range Status   Specimen Description BLOOD RIGHT HAND   Final   Special Requests BOTTLES DRAWN AEROBIC AND ANAEROBIC 10CC   Final   Culture  Setup Time     Final   Value: 01/02/2013 01:41     Performed at [facility]   Culture     Final   Value: NO GROWTH 5 DAYS     Performed at [facility]   Report Status 01/08/2013 FINAL   Final  MRSA PCR SCREENING     Status: None   Collection Time    01/01/13 10:25 PM      Result Value Range Status   MRSA by PCR NEGATIVE  NEGATIVE Final   Comment:            The GeneXpert MRSA Assay (FDA     approved for NASAL specimens     only), is one component of a     comprehensive MRSA colonization      surveillance program. It is not     intended to diagnose MRSA  infection nor to guide or     monitor treatment for     MRSA infections.     Studies:  Recent x-ray studies have been reviewed in detail by the Attending Physician  Scheduled Meds:  Scheduled Meds: . calcium acetate  1,334 mg Oral TID WC  . cloNIDine  0.3 mg Oral BID  . feeding supplement (RESOURCE BREEZE)  1 Container Oral TID BM  . furosemide  80 mg Oral BID  . heparin subcutaneous  5,000 Units Subcutaneous Q8H  . levofloxacin  500 mg Oral Q48H  . nitroGLYCERIN  1 inch Topical Q6H  . sodium bicarbonate  650 mg Oral BID  . Vitamin D (Ergocalciferol)  50,000 Units Oral Q7 days   Time spent on care of this patient: 25 min   ELLIS,ALLISON L., ANP  [facility] Office  [phone] Pager - Text Page per Shea Evans as per below:  On-Call/Text Page:      [email or web address]      password [email or web address]  If 7PM-7AM, please contact night-coverage [email or web address] Password [email or web address] 01/08/2013, 12:24 PM   LOS: 7 days   I have personally examined this patient and reviewed the entire database. I have reviewed the above note, made any necessary editorial changes, and agree with its content.  Cherene Altes, MD [facility]

## 2013-01-08 NOTE — Progress Notes (Signed)
Assessment  1. Acute and/or chronic renal failure- only old lab is creat 1.6 form 2008, 3.93 on admission  2 Renal cystic mass, left --suspicious for malignancy (seen by Dr. Jeffie Pollock)  3. Pneumonia, urine antigen for legionella was +  4. Hypertension- suboptimal  5. Altered mental status not typical of uremia, but may need to do  6. Proteinuria/ hypoalbuminemia  7  Vita D defic Plan: Cont PO furosemide, get out of bed, 24 hr urine protein, if nephrotic will plan Bx, D/C foley  Subjective: Interval History: No appetite  Objective: Vital signs in last 24 hours: Temp:  [97.4 F (36.3 C)-97.8 F (36.6 C)] 97.4 F (36.3 C) (10/31 0828) Pulse Rate:  [59-66] 59 (10/31 0700) Resp:  [9-19] 15 (10/31 0700) BP: (122-165)/(82-113) 140/90 mmHg (10/31 0700) SpO2:  [95 %-100 %] 97 % (10/31 0700) Weight:  [106.6 kg (235 lb 0.2 oz)] 106.6 kg (235 lb 0.2 oz) (10/31 0400) Weight change:   Intake/Output from previous day: 10/30 0701 - 10/31 0700 In: 1752.1 [P.O.:420; I.V.:1332.1] Out: 3075 [Urine:3075] Intake/Output this shift:    General appearance: alert, cooperative and flat affect Resp: clear to auscultation bilaterally Chest wall: no tenderness Cardio: regular rate and rhythm, S1, S2 normal, no murmur, click, rub or gallop and normal apical impulse Extremities: edema 1-2+  Lab Results:  Recent Labs  01/06/13 0430 01/07/13 0420  WBC 6.8 5.4  HGB 11.8* 11.7*  HCT 34.6* 33.9*  PLT 203 265   BMET:  Recent Labs  01/07/13 0420 01/08/13 0438  NA 138 143  K 3.7 3.8  CL 101 104  CO2 22 24  GLUCOSE 118* 104*  BUN 99* 105*  CREATININE 8.39* 8.51*  CALCIUM 7.6* 8.2*   No results found for this basename: PTH,  in the last 72 hours Iron Studies: No results found for this basename: IRON, TIBC, TRANSFERRIN, FERRITIN,  in the last 72 hours Studies/Results: No results found.  Scheduled: . calcium acetate  1,334 mg Oral TID WC  . cloNIDine  0.3 mg Oral BID  . feeding supplement  (RESOURCE BREEZE)  1 Container Oral TID BM  . furosemide  80 mg Oral BID  . heparin subcutaneous  5,000 Units Subcutaneous Q8H  . levofloxacin  500 mg Oral Q48H  . nitroGLYCERIN  1 inch Topical Q6H  . sodium bicarbonate  650 mg Oral BID  . Vitamin D (Ergocalciferol)  50,000 Units Oral Q7 days    LOS: 7 days   Lashai Grosch C 01/08/2013,8:55 AM

## 2013-01-08 NOTE — Progress Notes (Signed)
Rehab Admissions Coordinator Note:  Patient was screened by Cleatrice Burke for appropriateness for an Inpatient Acute Rehab Consult.  At this time, we are recommending Inpatient Rehab consult when appropriate.  Cleatrice Burke 01/08/2013, 12:12 PM  I can be reached at [phone].

## 2013-01-09 LAB — RENAL FUNCTION PANEL
Albumin: 2.2 g/dL — ABNORMAL LOW (ref 3.5–5.2)
BUN: 111 mg/dL — ABNORMAL HIGH (ref 6–23)
BUN: 107 mg/dL — ABNORMAL HIGH (ref 6–23)
CO2: 25 mEq/L (ref 19–32)
CO2: 25 mEq/L (ref 19–32)
Chloride: 100 mEq/L (ref 96–112)
Chloride: 99 mEq/L (ref 96–112)
Creatinine, Ser: 8.63 mg/dL — ABNORMAL HIGH (ref 0.50–1.35)
GFR calc non Af Amer: 6 mL/min — ABNORMAL LOW (ref >90)
Glucose, Bld: 106 mg/dL — ABNORMAL HIGH (ref 70–99)
Potassium: 3.4 mEq/L — ABNORMAL LOW (ref 3.5–5.1)
Potassium: 3.5 mEq/L (ref 3.5–5.1)
Sodium: 140 mEq/L (ref 135–145)

## 2013-01-09 MED ORDER — ISOSORBIDE DINITRATE 5 MG PO TABS
5.0000 mg | ORAL_TABLET | Freq: Three times a day (TID) | ORAL | Status: DC
  Administered 2013-01-09 – 2013-01-10 (×4): 5 mg via ORAL
  Filled 2013-01-09 (×6): qty 1

## 2013-01-09 MED ORDER — HYDRALAZINE HCL 25 MG PO TABS
25.0000 mg | ORAL_TABLET | Freq: Three times a day (TID) | ORAL | Status: DC
  Administered 2013-01-09 – 2013-01-12 (×9): 25 mg via ORAL
  Filled 2013-01-09 (×15): qty 1

## 2013-01-09 MED ORDER — CLONIDINE HCL 0.3 MG PO TABS
0.3000 mg | ORAL_TABLET | Freq: Three times a day (TID) | ORAL | Status: DC
  Administered 2013-01-09 – 2013-01-10 (×4): 0.3 mg via ORAL
  Filled 2013-01-09 (×5): qty 1

## 2013-01-09 MED ORDER — POTASSIUM CHLORIDE CRYS ER 20 MEQ PO TBCR
20.0000 meq | EXTENDED_RELEASE_TABLET | Freq: Once | ORAL | Status: AC
  Administered 2013-01-09: 20 meq via ORAL
  Filled 2013-01-09: qty 1

## 2013-01-09 NOTE — Progress Notes (Signed)
[facility] Progress Note [place] TEAM 1 - Stepdown/ICU TEAM   Brian Lawson [id number] DOB: 1955/11/29 DOA: 01/01/2013 PCP: "[facility] " - per pt   Brief narrative: 57 y/o male who presented to ER after found on the floor by his mother. 3 days of diarrhea and cough - fever of 105 in the ER. Has a h/o Gout and HTN - has no h/o chronic renal failure as far as he knows. Last saw his doctor about 3 mo prior. Has no urinary complaints.   Assessment/Plan:  Sepsis due to PNA - resolved -supportive care -Legionella PNA and associated dehydration from diarrhea and poor PO intake and ARF  Acute hypoxic respiratory failure due to Legionella PNA - resolved - blood cx no growth  - Levaquin to complete 10 days of tx   ARF/metabolic acidosis/ATN - renal ultrasound revealed no obstruction but incidental mass found - MRI confirmed with concerns for malignancy (see below) - initially was oliguric so foley inserted - noted to be on ACE I at home  - Nephro feels is likely 2/2 ATN  - the possibility of HD this admission remains  Malignant HYPERTENSION -Not yet controlled therefore blood pressure medication to be further titrated today - remains on BB gtt   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 -Dr. Jeffie Pollock with Urology recommended outpt f/u with resection when more stable medically   Slurred speech / metabolic encephalopathy  - neuro eval much appreciated - MRI head without acute changes - EEG results still pending - was likely due to progressive azotemia since worsened as renal function worsened - has now stabilized/resovled   GOUT - cont Allopurinol  Thrombocytopenia - likely due to sepsis - now resolved   Diarrhea due to legionella - appears to have resolved   Code Status: FULL Family Communication: no family present at time of exam Disposition Plan:  SDU  Consultants: Nephrology Urology Neurology  Procedures: EEG - pending  Antibiotics: Vanc/ Zosyn 10/25 >>> 10/26 Levaquin 10/27 >>> Zithromax 10/25 >>> 10/27 Rocephin 10/25 >>> 10/26  DVT prophylaxis: SQ heparin  HPI/Subjective: Pt alert and conversant.  Has no new complaints.  Denies n/v, abdom pain, or cp.  Reports that his appetite is slowly improving.   Objective: Blood pressure 148/103, pulse 58, temperature 97.5 F (36.4 C), temperature source Oral, resp. rate 14, height 6\' 3"  (1.905 m), weight 106.6 kg (235 lb 0.2 oz), SpO2 95.00%.  Intake/Output Summary (Last 24 hours) at 01/09/13 1042 Last data filed at 01/09/13 0800  Gross per 24 hour  Intake 977.38 ml  Output   3800 ml  Net -2822.62 ml    Exam: General: no acute respiratory distress Lungs: CTA B w/o wheeze or crackles  Cardiovascular: Regular rate and rhythm without murmur gallop or rub  Abdomen: Nontender, nondistended, soft, bowel sounds positive, no rebound, no ascites, no appreciable mass Extremities: No significant cyanosis, or clubbing - 1+ edema bilateral lower extremities  Data Reviewed: Basic Metabolic Panel:  Recent Labs Lab 01/06/13 0546 01/07/13 0420 01/08/13 0438 01/09/13 0325 01/09/13 0535  NA 139 138 143 141 140  K 3.7 3.7 3.8 3.4* 3.5  CL 101 101 104 100 99  CO2 21 22 24 25 25   GLUCOSE 108* 118* 104* 106* 102*  BUN 99* 99* 105* 111* 107*  CREATININE 8.51* 8.39* 8.51* 8.63* 8.63*  CALCIUM 7.5* 7.6* 8.2* 8.7 8.4  PHOS 8.3* 7.2* 6.8* 7.5* 7.4*   Liver  Function Tests:  Recent Labs Lab 01/06/13 0546 01/07/13 0420 01/08/13 0438 01/09/13 0325 01/09/13 0535  ALBUMIN 1.6* 1.7* 1.9* 2.2* 2.2*    CBC:  Recent Labs Lab 01/03/13 0450 01/04/13 0530 01/05/13 0425 01/06/13 0430 01/07/13 0420  WBC 12.1* 10.0 8.7 6.8 5.4  HGB 13.2 11.6* 12.2* 11.8* 11.7*  HCT 38.8* 33.5* 34.0* 34.6* 33.9*  MCV 82.4 81.9 81.1 81.6 80.1  PLT 107* 123* 153 203 265   Cardiac  Enzymes:  Recent Labs Lab 01/03/13 2330 01/04/13 0530  CKTOTAL 637* 526*    Recent Results (from the past 240 hour(s))  CULTURE, BLOOD (ROUTINE X 2)     Status: None   Collection Time    01/01/13  8:30 PM      Result Value Range Status   Specimen Description BLOOD RIGHT ARM   Final   Special Requests BOTTLES DRAWN AEROBIC AND ANAEROBIC 10CC   Final   Culture  Setup Time     Final   Value: 01/02/2013 01:13     Performed at [facility]   Culture     Final   Value: NO GROWTH 5 DAYS     Performed at [facility]   Report Status 01/08/2013 FINAL   Final  CULTURE, BLOOD (ROUTINE X 2)     Status: None   Collection Time    01/01/13  8:45 PM      Result Value Range Status   Specimen Description BLOOD RIGHT HAND   Final   Special Requests BOTTLES DRAWN AEROBIC AND ANAEROBIC 10CC   Final   Culture  Setup Time     Final   Value: 01/02/2013 01:41     Performed at [facility]   Culture     Final   Value: NO GROWTH 5 DAYS     Performed at [facility]   Report Status 01/08/2013 FINAL   Final  MRSA PCR SCREENING     Status: None   Collection Time    01/01/13 10:25 PM      Result Value Range Status   MRSA by PCR NEGATIVE  NEGATIVE Final   Comment:            The GeneXpert MRSA Assay (FDA     approved for NASAL specimens     only), is one component of a     comprehensive MRSA colonization     surveillance program. It is not     intended to diagnose MRSA     infection nor to guide or     monitor treatment for     MRSA infections.     Studies:  Recent x-ray studies have been reviewed in detail by the Attending Physician  Scheduled Meds:  Scheduled Meds: . calcium acetate  1,334 mg Oral TID WC  . cloNIDine  0.3 mg Oral BID  . feeding supplement (RESOURCE BREEZE)  1 Container Oral TID BM  . furosemide  80 mg Oral BID  . heparin subcutaneous  5,000 Units Subcutaneous Q8H  . levofloxacin  500 mg Oral Q48H  . nitroGLYCERIN  1 inch Topical Q6H   . sodium bicarbonate  650 mg Oral BID  . Vitamin D (Ergocalciferol)  50,000 Units Oral Q7 days   Time spent on care of this patient: 25 min   Cherene Altes, MD [facility] Office  [phone] Pager [phone]  On-Call/Text Page:      [email or web address]      password [email or web address]  If 7PM-7AM,  please contact night-coverage [email or web address] Password [email or web address] 01/09/2013, 10:42 AM   LOS: 8 days

## 2013-01-09 NOTE — Progress Notes (Signed)
Assessment  1. Nonoliguric Acute and/or chronic renal failure- only old lab is creat 1.6 form 2008, 3.93 on admission  2 Renal cystic mass, left --suspicious for malignancy (seen by Dr. Jeffie Pollock)  3. Pneumonia, urine antigen for legionella was +  4. Hypertension- suboptimal  5. Altered mental status not typical of uremia 6. Proteinuria/ hypoalbuminemia  7 Vita D defic  Plan: Hold diuretics  Subjective: Interval History: 4000cc UOP over 24 hrs, no significant proteinuria  Objective: Vital signs in last 24 hours: Temp:  [97.5 F (36.4 C)] 97.5 F (36.4 C) (11/01 1200) Pulse Rate:  [56-64] 61 (11/01 1200) Resp:  [10-18] 13 (11/01 1200) BP: (126-207)/(89-120) 126/89 mmHg (11/01 1200) SpO2:  [94 %-100 %] 94 % (11/01 1200) Weight change:   Intake/Output from previous day: 10/31 0701 - 11/01 0700 In: 1067.4 [I.V.:1067.4] Out: 4600 [Urine:4600] Intake/Output this shift: Total I/O In: 50 [I.V.:50] Out: 550 [Urine:550]  General appearance: alert and cooperative Chest wall: no tenderness Cardio: regular rate and rhythm, S1, S2 normal, no murmur, click, rub or gallop Extremities: edema less, 1+  Lab Results:  Recent Labs  01/07/13 0420  WBC 5.4  HGB 11.7*  HCT 33.9*  PLT 265   BMET:  Recent Labs  01/09/13 0325 01/09/13 0535  NA 141 140  K 3.4* 3.5  CL 100 99  CO2 25 25  GLUCOSE 106* 102*  BUN 111* 107*  CREATININE 8.63* 8.63*  CALCIUM 8.7 8.4   No results found for this basename: PTH,  in the last 72 hours Iron Studies: No results found for this basename: IRON, TIBC, TRANSFERRIN, FERRITIN,  in the last 72 hours Studies/Results: No results found.  Scheduled: . calcium acetate  1,334 mg Oral TID WC  . cloNIDine  0.3 mg Oral TID  . feeding supplement (RESOURCE BREEZE)  1 Container Oral TID BM  . furosemide  80 mg Oral BID  . heparin subcutaneous  5,000 Units Subcutaneous Q8H  . hydrALAZINE  25 mg Oral TID  . isosorbide dinitrate  5 mg Oral TID  . levofloxacin   500 mg Oral Q48H  . sodium bicarbonate  650 mg Oral BID  . Vitamin D (Ergocalciferol)  50,000 Units Oral Q7 days     LOS: 8 days   Brian Lawson C 01/09/2013,2:36 PM

## 2013-01-10 LAB — RENAL FUNCTION PANEL
Albumin: 2.6 g/dL — ABNORMAL LOW (ref 3.5–5.2)
BUN: 115 mg/dL — ABNORMAL HIGH (ref 6–23)
CO2: 25 mEq/L (ref 19–32)
GFR calc Af Amer: 7 mL/min — ABNORMAL LOW (ref >90)
Glucose, Bld: 105 mg/dL — ABNORMAL HIGH (ref 70–99)
Phosphorus: 8.5 mg/dL — ABNORMAL HIGH (ref 2.3–4.6)
Potassium: 3.8 mEq/L (ref 3.5–5.1)
Sodium: 138 mEq/L (ref 135–145)

## 2013-01-10 LAB — CBC
HCT: 36 % — ABNORMAL LOW (ref 39.0–52.0)
Hemoglobin: 12.5 g/dL — ABNORMAL LOW (ref 13.0–17.0)
MCV: 82.2 fL (ref 78.0–100.0)
Platelets: 433 10*3/uL — ABNORMAL HIGH (ref 150–400)
RDW: 16.6 % — ABNORMAL HIGH (ref 11.5–15.5)
WBC: 9.7 10*3/uL (ref 4.0–10.5)

## 2013-01-10 MED ORDER — ISOSORBIDE DINITRATE 5 MG PO TABS
5.0000 mg | ORAL_TABLET | Freq: Two times a day (BID) | ORAL | Status: DC
  Administered 2013-01-11 – 2013-01-25 (×28): 5 mg via ORAL
  Filled 2013-01-10 (×31): qty 1

## 2013-01-10 MED ORDER — CLONIDINE HCL 0.2 MG PO TABS
0.2000 mg | ORAL_TABLET | Freq: Three times a day (TID) | ORAL | Status: DC
  Administered 2013-01-10 – 2013-01-25 (×42): 0.2 mg via ORAL
  Filled 2013-01-10 (×35): qty 1
  Filled 2013-01-10: qty 2
  Filled 2013-01-10 (×12): qty 1

## 2013-01-10 NOTE — Progress Notes (Signed)
Assessment  1. Nonoliguric Acute and/or chronic renal failure- only old lab is creat 1.6 form 2008, 3.93 on admission ?? Recovery phase  2. Renal cystic mass, left --suspicious for malignancy (seen by Dr. Jeffie Pollock)  3. Pneumonia, urine antigen for legionella was +  4. Hypertension- suboptimal, possibly worsened by renal mass  5. Altered mental status improved but not typical of uremia  6. Proteinuria/ hypoalbuminemia with 327 mg protein in 24 hr collection 7. Vita D defic  Plan: Continue to Hold diuretics and follow for recovery of function; renal mass to be addressed later   Subjective: Interval History: Sitting in chair  Objective: Vital signs in last 24 hours: Temp:  [97.2 F (36.2 C)-97.7 F (36.5 C)] 97.2 F (36.2 C) (11/02 0833) Pulse Rate:  [61-66] 66 (11/01 1600) Resp:  [13-15] 14 (11/02 0300) BP: (103-158)/(71-97) 156/96 mmHg (11/02 1046) SpO2:  [94 %-97 %] 96 % (11/02 0833) Weight change:   Intake/Output from previous day: 11/01 0701 - 11/02 0700 In: 50 [I.V.:50] Out: 1925 [Urine:1925] Intake/Output this shift: Total I/O In: 240 [P.O.:240] Out: 500 [Urine:500]  General appearance: alert, cooperative and more animated today Resp: clear to auscultation bilaterally Cardio: regular rate and rhythm, S1, S2 normal, no murmur, click, rub or gallop GI: soft, non-tender; bowel sounds normal; no masses,  no organomegaly Extremities: edema tr to 1+ Speech slightly off  Lab Results:  Recent Labs  01/10/13 0500  WBC 9.7  HGB 12.5*  HCT 36.0*  PLT 433*   BMET:  Recent Labs  01/09/13 0535 01/10/13 0500  NA 140 138  K 3.5 3.8  CL 99 96  CO2 25 25  GLUCOSE 102* 105*  BUN 107* 115*  CREATININE 8.63* 8.62*  CALCIUM 8.4 9.1   No results found for this basename: PTH,  in the last 72 hours Iron Studies: No results found for this basename: IRON, TIBC, TRANSFERRIN, FERRITIN,  in the last 72 hours Studies/Results: No results found.  Scheduled: . calcium acetate   1,334 mg Oral TID WC  . cloNIDine  0.3 mg Oral TID  . feeding supplement (RESOURCE BREEZE)  1 Container Oral TID BM  . heparin subcutaneous  5,000 Units Subcutaneous Q8H  . hydrALAZINE  25 mg Oral TID  . isosorbide dinitrate  5 mg Oral TID  . levofloxacin  500 mg Oral Q48H  . sodium bicarbonate  650 mg Oral BID  . Vitamin D (Ergocalciferol)  50,000 Units Oral Q7 days     LOS: 9 days   Nickcole Bralley C 01/10/2013,11:32 AM

## 2013-01-10 NOTE — Progress Notes (Signed)
[facility] Progress Note [place] TEAM 1 - Stepdown/ICU TEAM   Brian Lawson [id number] DOB: 04-16-1955 DOA: 01/01/2013 PCP: "[facility] " - per pt   Brief narrative: 57 y/o male who presented to ER after found on the floor by his mother. 3 days of diarrhea and cough - fever of 105 in the ER. Has a h/o Gout and HTN - has no h/o chronic renal failure as far as he knows. Last saw his doctor about 3 mo prior. Has no urinary complaints.   Assessment/Plan:  Sepsis due to PNA - resolved -supportive care -Legionella PNA and associated dehydration from diarrhea and poor PO intake and ARF  Acute hypoxic respiratory failure due to Legionella PNA - resolved - blood cx no growth  - Levaquin to complete 10 days of tx   ARF/metabolic acidosis/ATN - renal ultrasound revealed no obstruction but incidental mass found - MRI confirmed with concerns for malignancy (see below) - initially was oliguric so foley inserted - noted to be on ACE I at home  - Nephro feels is likely 2/2 ATN  - the possibility of HD this admission remains  Malignant HYPERTENSION -BP much improved today - is now off BB gtt - care to avoid over correction  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 -Dr. Jeffie Pollock with Urology recommended outpt f/u with resection when more stable medically   Slurred speech / metabolic encephalopathy  - neuro eval much appreciated - MRI head without acute changes - EEG results still pending - was likely due to progressive azotemia since worsened as renal function worsened - has now stabilized/resovled   GOUT - cont Allopurinol  Thrombocytopenia - likely due to sepsis - resolved   Diarrhea due to legionella - appears to have resolved   Code Status: FULL Family Communication: no family present at time of exam Disposition Plan: SDU  Consultants: Nephrology Urology Neurology  Procedures: EEG -  pending  Antibiotics: Vanc/ Zosyn 10/25 >>> 10/26 Levaquin 10/27 >>> Zithromax 10/25 >>> 10/27 Rocephin 10/25 >>> 10/26  DVT prophylaxis: SQ heparin  HPI/Subjective: Pt alert and conversant and quiet pleasant today.  Has no new complaints.  Denies n/v, abdom pain, or cp.    Objective: Blood pressure 127/76, pulse 66, temperature 97.8 F (36.6 C), temperature source Oral, resp. rate 14, height 6\' 3"  (1.905 m), weight 106.6 kg (235 lb 0.2 oz), SpO2 97.00%.  Intake/Output Summary (Last 24 hours) at 01/10/13 1539 Last data filed at 01/10/13 1500  Gross per 24 hour  Intake    240 ml  Output   2150 ml  Net  -1910 ml    Exam: General: no acute respiratory distress Lungs: CTA B w/o wheeze or crackles  Cardiovascular: Regular rate and rhythm without murmur gallop or rub  Abdomen: Nontender, nondistended, soft, bowel sounds positive, no rebound, no ascites, no appreciable mass Extremities: No significant cyanosis, or clubbing - 1+ edema bilateral lower extremities  Data Reviewed: Basic Metabolic Panel:  Recent Labs Lab 01/07/13 0420 01/08/13 0438 01/09/13 0325 01/09/13 0535 01/10/13 0500  NA 138 143 141 140 138  K 3.7 3.8 3.4* 3.5 3.8  CL 101 104 100 99 96  CO2 22 24 25 25 25   GLUCOSE 118* 104* 106* 102* 105*  BUN 99* 105* 111* 107* 115*  CREATININE 8.39* 8.51* 8.63* 8.63* 8.62*  CALCIUM 7.6* 8.2* 8.7 8.4 9.1  PHOS 7.2* 6.8* 7.5* 7.4* 8.5*   Liver Function Tests:  Recent Labs Lab 01/07/13 0420 01/08/13 0438 01/09/13 0325 01/09/13 0535 01/10/13 0500  ALBUMIN 1.7* 1.9* 2.2* 2.2* 2.6*    CBC:  Recent Labs Lab 01/04/13 0530 01/05/13 0425 01/06/13 0430 01/07/13 0420 01/10/13 0500  WBC 10.0 8.7 6.8 5.4 9.7  HGB 11.6* 12.2* 11.8* 11.7* 12.5*  HCT 33.5* 34.0* 34.6* 33.9* 36.0*  MCV 81.9 81.1 81.6 80.1 82.2  PLT 123* 153 203 265 433*   Cardiac Enzymes:  Recent Labs Lab 01/03/13 2330 01/04/13 0530  CKTOTAL 637* 526*    Recent Results (from the past  240 hour(s))  CULTURE, BLOOD (ROUTINE X 2)     Status: None   Collection Time    01/01/13  8:30 PM      Result Value Range Status   Specimen Description BLOOD RIGHT ARM   Final   Special Requests BOTTLES DRAWN AEROBIC AND ANAEROBIC 10CC   Final   Culture  Setup Time     Final   Value: 01/02/2013 01:13     Performed at [facility]   Culture     Final   Value: NO GROWTH 5 DAYS     Performed at [facility]   Report Status 01/08/2013 FINAL   Final  CULTURE, BLOOD (ROUTINE X 2)     Status: None   Collection Time    01/01/13  8:45 PM      Result Value Range Status   Specimen Description BLOOD RIGHT HAND   Final   Special Requests BOTTLES DRAWN AEROBIC AND ANAEROBIC 10CC   Final   Culture  Setup Time     Final   Value: 01/02/2013 01:41     Performed at [facility]   Culture     Final   Value: NO GROWTH 5 DAYS     Performed at [facility]   Report Status 01/08/2013 FINAL   Final  MRSA PCR SCREENING     Status: None   Collection Time    01/01/13 10:25 PM      Result Value Range Status   MRSA by PCR NEGATIVE  NEGATIVE Final   Comment:            The GeneXpert MRSA Assay (FDA     approved for NASAL specimens     only), is one component of a     comprehensive MRSA colonization     surveillance program. It is not     intended to diagnose MRSA     infection nor to guide or     monitor treatment for     MRSA infections.     Studies:  Recent x-ray studies have been reviewed in detail by the Attending Physician  Scheduled Meds:  Scheduled Meds: . calcium acetate  1,334 mg Oral TID WC  . cloNIDine  0.3 mg Oral TID  . feeding supplement (RESOURCE BREEZE)  1 Container Oral TID BM  . heparin subcutaneous  5,000 Units Subcutaneous Q8H  . hydrALAZINE  25 mg Oral TID  . isosorbide dinitrate  5 mg Oral TID  . levofloxacin  500 mg Oral Q48H  . sodium bicarbonate  650 mg Oral BID  . Vitamin D (Ergocalciferol)  50,000 Units Oral Q7 days   Time  spent on care of this patient: 25 min   Cherene Altes, MD [facility] Office  [phone] Pager [phone]  On-Call/Text Page:      [email or web address]      password [email or web address]  If 7PM-7AM, please contact  night-coverage [email or web address] Password [email or web address] 01/10/2013, 3:39 PM   LOS: 9 days

## 2013-01-11 LAB — RENAL FUNCTION PANEL
BUN: 116 mg/dL — ABNORMAL HIGH (ref 6–23)
CO2: 26 mEq/L (ref 19–32)
Chloride: 97 mEq/L (ref 96–112)
Creatinine, Ser: 8.4 mg/dL — ABNORMAL HIGH (ref 0.50–1.35)
Glucose, Bld: 118 mg/dL — ABNORMAL HIGH (ref 70–99)
Potassium: 4.1 mEq/L (ref 3.5–5.1)

## 2013-01-11 NOTE — Progress Notes (Signed)
S: Says appetite improving but still with nausea O:BP 175/94  Pulse 66  Temp(Src) 97.5 F (36.4 C) (Oral)  Resp 12  Ht 6\' 3"  (1.905 m)  Wt 106.6 kg (235 lb 0.2 oz)  BMI 29.37 kg/m2  SpO2 97%  Intake/Output Summary (Last 24 hours) at 01/11/13 0749 Last data filed at 01/10/13 2000  Gross per 24 hour  Intake    240 ml  Output   1175 ml  Net   -935 ml   Weight change:  Gen: awake and alert CVS:RRR Resp: bronchial BS Lt base Abd:+ BS NT ND Ext:no edema NEURO:OX3, M&SI, no asterixis   . calcium acetate  1,334 mg Oral TID WC  . cloNIDine  0.2 mg Oral TID  . feeding supplement (RESOURCE BREEZE)  1 Container Oral TID BM  . heparin subcutaneous  5,000 Units Subcutaneous Q8H  . hydrALAZINE  25 mg Oral TID  . isosorbide dinitrate  5 mg Oral BID  . levofloxacin  500 mg Oral Q48H  . sodium bicarbonate  650 mg Oral BID  . Vitamin D (Ergocalciferol)  50,000 Units Oral Q7 days   No results found. BMET    Component Value Date/Time   NA 137 01/11/2013 0500   K 4.1 01/11/2013 0500   CL 97 01/11/2013 0500   CO2 26 01/11/2013 0500   GLUCOSE 118* 01/11/2013 0500   BUN 116* 01/11/2013 0500   CREATININE 8.40* 01/11/2013 0500   CALCIUM 9.0 01/11/2013 0500   GFRNONAA 6* 01/11/2013 0500   GFRAA 7* 01/11/2013 0500   CBC    Component Value Date/Time   WBC 9.7 01/10/2013 0500   RBC 4.38 01/10/2013 0500   HGB 12.5* 01/10/2013 0500   HCT 36.0* 01/10/2013 0500   PLT 433* 01/10/2013 0500   MCV 82.2 01/10/2013 0500   MCH 28.5 01/10/2013 0500   MCHC 34.7 01/10/2013 0500   RDW 16.6* 01/10/2013 0500   LYMPHSABS 1.0 01/01/2013 1859   MONOABS 0.7 01/01/2013 1859   EOSABS 0.0 01/01/2013 1859   BASOSABS 0.0 01/01/2013 1859     Assessment:  1. 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  Scr 8.4 2. Lt renal mass 3. PNA 4. HTN  Plan: 1. Scr may be trending down, will cont to follow.  The ? Is what is his baseline and only time will tell.  There is a good chance he will require HD after nephrectomy. 2.  Will show  him the HD videos 3. Daily SCr 4. Change to renal diet   Fajr Fife T

## 2013-01-11 NOTE — Progress Notes (Signed)
Physical Therapy Treatment Patient Details Name: Nathanel Mubarak MRN: [id number] DOB: 1955/05/21 Today's Date: 01/11/2013 Time: [id number] PT Time Calculation (min): 30 min  PT Assessment / Plan / Recommendation  History of Present Illness 57 y/o male who presented to ER after found on the floor by his mother. 3 days of diarrhea and cough - fever of 105 in the ER. Has a h/o Gout and HTN . Pt with legnionella PNA, severe sepsis, transient blindness, HTN, encephalopathy, renal mass   PT Comments   Pt with limited participation today stating nausea as limiting factor. Cued pt for breathing technique and gaze stabilization but he would not maintain either and return to supine. Pt with flat affect and relatively blank stare throughout majority of tx but would respond to questions and commands. Will continue to follow to maximize mobility.  Follow Up Recommendations        Does the patient have the potential to tolerate intense rehabilitation     Barriers to Discharge        Equipment Recommendations       Recommendations for Other Services    Frequency     Progress towards PT Goals Progress towards PT goals: Not progressing toward goals - comment  Plan Current plan remains appropriate    Precautions / Restrictions Precautions Precautions: Fall Restrictions Weight Bearing Restrictions: No   Pertinent Vitals/Pain No pain, nausea 174/95 supine   Mobility  Bed Mobility Bed Mobility: Supine to Sit;Sit to Supine;Rolling Right;Rolling Left Rolling Right: 5: Supervision;With rail Rolling Left: 5: Supervision;With rail Supine to Sit: 5: Supervision;4: Min assist;HOB flat;With rails Sit to Supine: 6: Modified independent (Device/Increase time);HOB flat;With rail Details for Bed Mobility Assistance: Pt initially sat with supervision and cueing for rolling to right then sitting from side. Pt then returned to supine on his own due to report of nausea and not being able to remain sitting. Pt  repeated this sequence with varied assist from supervision to min assist with sitting x 5 trials. Pt continuing to state he would get up to chair and would sit 1-5 min trying to prepare himself and then return to supine. Pt with some stool and rolled to complete pericare but despite cueing, education and encouragement would not attempt standing or further HEP EOB or supine Transfers Transfers: Not assessed    Exercises     PT Diagnosis:    PT Problem List:   PT Treatment Interventions:     PT Goals (current goals can now be found in the care plan section)    Visit Information  Last PT Received On: 01/11/13 Assistance Needed: +1 History of Present Illness: 57 y/o male who presented to ER after found on the floor by his mother. 3 days of diarrhea and cough - fever of 105 in the ER. Has a h/o Gout and HTN . Pt with legnionella PNA, severe sepsis, transient blindness, HTN, encephalopathy, renal mass    Subjective Data      Cognition  Cognition Arousal/Alertness: Awake/alert Behavior During Therapy: Flat affect Overall Cognitive Status: Impaired/Different from baseline Following Commands: Follows one step commands with increased time    Balance  Static Sitting Balance Static Sitting - Balance Support: No upper extremity supported;Feet supported;Bilateral upper extremity supported Static Sitting - Level of Assistance: 5: Stand by assistance Static Sitting - Comment/# of Minutes: 5  End of Session PT - End of Session Activity Tolerance: Patient limited by fatigue Patient left: in bed;with call bell/phone within reach Nurse Communication: Mobility status  GP     Lanetta Inch Beth 01/11/2013, 9:56 AM Elwyn Reach, [place]

## 2013-01-11 NOTE — Plan of Care (Signed)
Problem: Food- and Nutrition-Related Knowledge Deficit (NB-1.1) Goal: Nutrition education Formal process to instruct or train a patient/client in a skill or to impart knowledge to help patients/clients voluntarily manage or modify food choices and eating behavior to maintain or improve health. Outcome: Progressing Nutrition Education Note  RD drawn to chart for poor PO since admission with recent transition to renal diet.  Renal Education provided. Provided Renal Guide Pyramid. Reviewed food groups and provided written recommended serving sizes specifically determined for patient's current nutritional status.   Explained why diet restrictions are needed and provided lists of foods to limit/avoid that are high potassium, sodium, and phosphorus. Provided specific recommendations on safer alternatives of these foods. Strongly encouraged compliance of this diet.   Discussed importance of protein intake at each meal and snack.  Discussed need for fluid restriction per MD, and based on care plan once developed. Teach back method used.  Expect good compliance.  Body mass index is 29.37 kg/(m^2). Pt meets criteria for overweight based on current BMI.  Current diet order is Renal 80-90, patient is consuming approximately 50% of meals at this time, however pt with several snacks at bedside and family bringing meals from home.  Pt is eating well.  Labs and medications reviewed. No further nutrition interventions warranted at this time. RD contact information provided. If additional nutrition issues arise, please re-consult RD.  Brynda Greathouse, MS RD LDN Clinical Inpatient Dietitian Pager: [phone] Weekend/After hours pager: [phone]

## 2013-01-11 NOTE — Consult Note (Signed)
Physical Medicine and Rehabilitation Consult  Reason for Consult:  Metabolic encephalopathy Referring Physician:  Dr. Thereasa Solo   HPI: Brian Lawson is a 57 y.o. male PMH significant for HTN, headaches and gout who was admitted on 01/01/13 after being found unresponsive on the floor at home by his mother. He reported a several day hx of diarrhea and fevers. He has been found to have acute hypoxic respiratory failure, dehydration and SIRS due to Legionella PNA. He had worsening of renal failure with Cr rising to 7.61 despite IVFs.  Nephrology was consulted for workup and recommended full work up as well as well increasing IVF and avoidance of nephrotoxic medications--patient on prinzide PTA.   Eval of renal failure included a renal U/S which revealed a large mass on the left kidney. This prompted a non contrasted MRI which confirmed an 8.4 x 9.5 cm complex cystic and solid mass off the lower/mid pole of the left kidney. There is no evidence of metastasis or renal vein involvement seen on this non contrasted study. He was evaluated by Dr. Levora Dredge who recommends follow up once renal function close to baseline as well as improvement in nutritional status.   He has had slurred speech with confusion due to metabolic encephalopathy. MRI brain without acute changes but diffuse white matter changes markedly advanced for age. EEG unremarkable and neurology feels that concurrent illness is unmasking  some signs and symptoms referable to these white matter changes. Patient has had vertigo as well as nausea that is limiting activity. Renal failure felt to be due to ATN and is being monitored for evidence of renal recovery--question of acute on CKD and as well as need for dialysis. MD, PT recommending CIR.  Patient oriented to person place and time. Upset because phlebotomist is not using central line for blood draw   ROS  Past Medical History  Diagnosis Date  . Hypertension   . Gout    Past Surgical History   Procedure Laterality Date  . Hernia repair      History reviewed. No pertinent family history.   Social History:  reports that he has quit smoking. He has never used smokeless tobacco. He reports that he drinks alcohol. He reports that he does not use illicit drugs.   Allergies: No Known Allergies   Medications Prior to Admission  Medication Sig Dispense Refill  . allopurinol (ZYLOPRIM) 100 MG tablet Take 100 mg by mouth daily.      Marland Kitchen lisinopril-hydrochlorothiazide (PRINZIDE,ZESTORETIC) 10-12.5 MG per tablet Take 1 tablet by mouth daily.        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 Status:  Mobility: Bed Mobility Bed Mobility: Supine to Sit;Sit to Supine;Rolling Right;Rolling Left Rolling Right: 5: Supervision;With rail Rolling Left: 5: Supervision;With rail Supine to Sit: 5: Supervision;4: Min assist;HOB flat;With rails Sit to Supine: 6: Modified independent (Device/Increase time);HOB flat;With rail Transfers Transfers: Not assessed Sit to Stand: 1: +2 Total assist;From bed Sit to Stand: Patient Percentage: 60% Stand to Sit: 1: +2 Total assist;To chair/3-in-1 Stand to Sit: Patient Percentage: 60% Stand Pivot Transfers: 1: +2 Total assist Stand Pivot Transfers: Patient Percentage: 70% Ambulation/Gait Ambulation/Gait Assistance: Not tested (comment)    ADL:    Cognition: Cognition Overall  Cognitive Status: Impaired/Different from baseline Orientation Level: Oriented X4 Cognition Arousal/Alertness: Awake/alert Behavior During Therapy: Flat affect Overall Cognitive Status: Impaired/Different from baseline Area of  Impairment: Memory;Safety/judgement;Following commands Following Commands: Follows one step commands with increased time Safety/Judgement: Decreased awareness of deficits General Comments: Pt with difficulty recalling day of week  Blood pressure 146/78, pulse 75, temperature 97.9 F (36.6 C), temperature source Oral, resp. rate 14, height 6\' 3"  (1.905 m), weight 106.6 kg (235 lb 0.2 oz), SpO2 98.00%. Physical Exam  Nursing note and vitals reviewed. Constitutional: He is oriented to person, place, and time. He appears well-developed and well-nourished.  HENT:  Head: Normocephalic and atraumatic.  Eyes: Conjunctivae and EOM are normal. Pupils are equal, round, and reactive to light.  Neck: Normal range of motion.  R IJ line not swelling or erythema  Cardiovascular: Normal rate, regular rhythm and normal heart sounds.   Respiratory: Effort normal and breath sounds normal. He has no wheezes. He has no rales.  GI: Soft. Bowel sounds are normal. He exhibits no distension. There is no tenderness. There is no guarding.  Musculoskeletal:  Heberden's nodules bilateral DIPs Full range of motion in bilateral upper extremities  Sitting balance is good at edge of bed Bed mobility independent  Neurological: He is alert and oriented to person, place, and time. He has normal strength. No cranial nerve deficit or sensory deficit.  Reflex Scores:      Patellar reflexes are 0 on the right side and 0 on the left side.      Achilles reflexes are 0 on the right side and 0 on the left side. Normal sitting balance  Skin: Skin is warm and dry.  Psychiatric: He has a normal mood and affect.   musculoskeletal  Results for orders placed during the hospital encounter of 01/01/13 (from the past 24 hour(s))  RENAL FUNCTION PANEL     Status: Abnormal   Collection Time    01/11/13  5:00 AM      Result Value Range   Sodium 137  135 - 145 mEq/L   Potassium 4.1  3.5 - 5.1 mEq/L   Chloride 97  96 - 112 mEq/L    CO2 26  19 - 32 mEq/L   Glucose, Bld 118 (*) 70 - 99 mg/dL   BUN 116 (*) 6 - 23 mg/dL   Creatinine, Ser 8.40 (*) 0.50 - 1.35 mg/dL   Calcium 9.0  8.4 - 10.5 mg/dL   Phosphorus 7.7 (*) 2.3 - 4.6 mg/dL   Albumin 2.4 (*) 3.5 - 5.2 g/dL   GFR calc non Af Amer 6 (*) >90 mL/min   GFR calc Af Amer 7 (*) >90 mL/min   No results found.  Assessment/Plan: Diagnosis: Deconditioning secondary to respiratory failure and renal failure 1. Does the need for close, 24 hr/day medical supervision in concert with the patient's rehab needs make it unreasonable for this patient to be served in a less intensive setting? No 2. Co-Morbidities requiring supervision/potential complications: Renal failure 3. Due to disease management, does the patient require 24 hr/day rehab nursing? No 4. Does the patient require coordinated care of a physician, rehab nurse, Not applicable to address physical and functional deficits in the context of the above medical diagnosis(es)? No Addressing deficits in the following areas: Not applicable 5. Can the patient actively participate in an intensive therapy program of at least 3 hrs of therapy per day at least 5 days per week? Yes 6. The potential for patient to make measurable  gains while on inpatient rehab is Not applicable 7. Anticipated functional outcomes upon discharge from inpatient rehab are not applicable with PT, not applicable with OT, not applicable with SLP. 8. Estimated rehab length of stay to reach the above functional goals is: Not applicable 9. Does the patient have adequate social supports to accommodate these discharge functional goals? Potentially 10. Anticipated D/C setting: Home 11. Anticipated post D/C treatments: [facility] therapy 12. Overall Rehab/Functional Prognosis: excellent  RECOMMENDATIONS: This patient's condition is appropriate for continued rehabilitative care in the following setting: [facility] Patient has agreed to participate in recommended program.  Potentially Note that insurance prior authorization may be required for reimbursement for recommended care.  Comment: Too high level    01/11/2013

## 2013-01-11 NOTE — Progress Notes (Signed)
Transferred to 6[facility]RM 16 by bed, report given to RN

## 2013-01-11 NOTE — Progress Notes (Signed)
Admission note:   Arrival Method: Transfer from [facility]. Mental Status: A&Ox4. Telemetry: N/A  Skin: Not assessed.  Tubes: N/A IV: RIJ triple lumen. Pain: Denies.  Family: No one at bedside. Living Situation: Lives at home. Safety Measures: Bed alarm in place. Call bell within reach. 6E Orientation: Oriented to unit and surroundings.   Brian Lawson, Brian Lawson

## 2013-01-11 NOTE — Progress Notes (Signed)
[facility] Progress Note [place] TEAM 1 - Stepdown/ICU TEAM   Brian Lawson [id number] DOB: [birth year] DOA: 01/01/2013 PCP: "[facility] " - per pt   Brief narrative: 58 y/o male who presented to ER after found on the floor by his mother. 3 days of diarrhea and cough - fever of 105 in the ER. Has a h/o Gout and HTN - has no h/o chronic renal failure as far as he knows. Last saw his doctor about 3 mo prior. Has no urinary complaints.   Assessment/Plan:  Sepsis due to PNA - resolved -supportive care -Legionella PNA and associated dehydration from diarrhea and poor PO intake and ARF  Acute hypoxic respiratory failure due to Legionella PNA - resolved - blood cx no growth  - Levaquin to complete 10 days of tx   ARF/metabolic acidosis/ATN - renal ultrasound revealed no obstruction but incidental mass found - MRI confirmed with concerns for malignancy (see below) - initially was oliguric so foley inserted - noted to be on ACE I at home  - Nephro feels is likely 2/2 ATN  - the possibility of HD this admission remains  Malignant HYPERTENSION -BP much improved today - is now off BB gtt - care to avoid over correction  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 -Dr. Jeffie Pollock with Urology recommended outpt f/u with resection when more stable medically  -Nephro notes that if nephrectomy indicated will likely need to initiate HD  Slurred speech / metabolic encephalopathy  - neuro eval much appreciated - MRI head without acute changes - EEG results still pending - was likely due to progressive azotemia since worsened as renal function worsened - has now stabilized/resovled   GOUT - cont Allopurinol  Thrombocytopenia - likely due to sepsis - resolved   Diarrhea due to legionella - appears to have resolved   Code Status: FULL Family Communication: no family present at time of exam Disposition Plan: Transfer to  floor - cont to follow crt and electrolytes   Consultants: Nephrology Urology Neurology  Procedures: EEG - pending  Antibiotics: Vanc/ Zosyn 10/25 >>> 10/26 Levaquin 10/27 >>> Zithromax 10/25 >>> 10/27 Rocephin 10/25 >>> 10/26  DVT prophylaxis: SQ heparin  HPI/Subjective: Pt alert and conversant.  Has no new complaints.  Denies n/v, abdom pain, or cp. Appetite is slowly improving.    Objective: Blood pressure 134/78, pulse 76, temperature 98.1 F (36.7 C), temperature source Oral, resp. rate 16, height 6\' 3"  (1.905 m), weight 106.6 kg (235 lb 0.2 oz), SpO2 94.00%.  Intake/Output Summary (Last 24 hours) at 01/11/13 1414 Last data filed at 01/11/13 0900  Gross per 24 hour  Intake    200 ml  Output   1075 ml  Net   -875 ml    Exam: General: no acute respiratory distress Lungs: CTA B w/o wheeze or crackles  Cardiovascular: Regular rate and rhythm without murmur gallop or rub  Abdomen: Nontender, nondistended, soft, bowel sounds positive, no rebound, no ascites, no appreciable mass Extremities: No significant cyanosis, or clubbing - 1+ edema bilateral lower extremities  Data Reviewed: Basic Metabolic Panel:  Recent Labs Lab 01/08/13 0438 01/09/13 0325 01/09/13 0535 01/10/13 0500 01/11/13 0500  NA 143 141 140 138 137  K 3.8 3.4* 3.5 3.8 4.1  CL 104 100 99 96 97  CO2 24 25 25 25 26   GLUCOSE 104* 106* 102* 105* 118*  BUN 105* 111* 107* 115* 116*  CREATININE 8.51*  8.63* 8.63* 8.62* 8.40*  CALCIUM 8.2* 8.7 8.4 9.1 9.0  PHOS 6.8* 7.5* 7.4* 8.5* 7.7*   Liver Function Tests:  Recent Labs Lab 01/08/13 0438 01/09/13 0325 01/09/13 0535 01/10/13 0500 01/11/13 0500  ALBUMIN 1.9* 2.2* 2.2* 2.6* 2.4*    CBC:  Recent Labs Lab 01/05/13 0425 01/06/13 0430 01/07/13 0420 01/10/13 0500  WBC 8.7 6.8 5.4 9.7  HGB 12.2* 11.8* 11.7* 12.5*  HCT 34.0* 34.6* 33.9* 36.0*  MCV 81.1 81.6 80.1 82.2  PLT 153 203 265 433*     Recent Results (from the past 240  hour(s))  CULTURE, BLOOD (ROUTINE X 2)     Status: None   Collection Time    01/01/13  8:30 PM      Result Value Range Status   Specimen Description BLOOD RIGHT ARM   Final   Special Requests BOTTLES DRAWN AEROBIC AND ANAEROBIC 10CC   Final   Culture  Setup Time     Final   Value: 01/02/2013 01:13     Performed at [facility]   Culture     Final   Value: NO GROWTH 5 DAYS     Performed at [facility]   Report Status 01/08/2013 FINAL   Final  CULTURE, BLOOD (ROUTINE X 2)     Status: None   Collection Time    01/01/13  8:45 PM      Result Value Range Status   Specimen Description BLOOD RIGHT HAND   Final   Special Requests BOTTLES DRAWN AEROBIC AND ANAEROBIC 10CC   Final   Culture  Setup Time     Final   Value: 01/02/2013 01:41     Performed at [facility]   Culture     Final   Value: NO GROWTH 5 DAYS     Performed at [facility]   Report Status 01/08/2013 FINAL   Final  MRSA PCR SCREENING     Status: None   Collection Time    01/01/13 10:25 PM      Result Value Range Status   MRSA by PCR NEGATIVE  NEGATIVE Final   Comment:            The GeneXpert MRSA Assay (FDA     approved for NASAL specimens     only), is one component of a     comprehensive MRSA colonization     surveillance program. It is not     intended to diagnose MRSA     infection nor to guide or     monitor treatment for     MRSA infections.     Studies:  Recent x-ray studies have been reviewed in detail by the Attending Physician  Scheduled Meds:  Scheduled Meds: . calcium acetate  1,334 mg Oral TID WC  . cloNIDine  0.2 mg Oral TID  . feeding supplement (RESOURCE BREEZE)  1 Container Oral TID BM  . heparin subcutaneous  5,000 Units Subcutaneous Q8H  . hydrALAZINE  25 mg Oral TID  . isosorbide dinitrate  5 mg Oral BID  . levofloxacin  500 mg Oral Q48H  . sodium bicarbonate  650 mg Oral BID  . Vitamin D (Ergocalciferol)  50,000 Units Oral Q7 days   Time spent on  care of this patient: 25 min   Erin Hearing, ANP [facility] Office  [phone] Pager [phone]  On-Call/Text Page:      [email or web address]      password [email or web address]  If 7PM-7AM, please  contact night-coverage [email or web address] Password [email or web address] 01/11/2013, 2:14 PM   LOS: 10 days   I have personally examined this patient and reviewed the entire database. I have reviewed the above note, made any necessary editorial changes, and agree with its content.  Cherene Altes, MD [facility]

## 2013-01-12 LAB — RENAL FUNCTION PANEL
Albumin: 2.6 g/dL — ABNORMAL LOW (ref 3.5–5.2)
BUN: 112 mg/dL — ABNORMAL HIGH (ref 6–23)
CO2: 25 mEq/L (ref 19–32)
Calcium: 9.2 mg/dL (ref 8.4–10.5)
Chloride: 98 mEq/L (ref 96–112)
Creatinine, Ser: 7.98 mg/dL — ABNORMAL HIGH (ref 0.50–1.35)
GFR calc Af Amer: 8 mL/min — ABNORMAL LOW (ref >90)
Glucose, Bld: 156 mg/dL — ABNORMAL HIGH (ref 70–99)
Phosphorus: 7.6 mg/dL — ABNORMAL HIGH (ref 2.3–4.6)
Potassium: 4.5 mEq/L (ref 3.5–5.1)
Sodium: 138 mEq/L (ref 135–145)

## 2013-01-12 MED ORDER — SODIUM CHLORIDE 0.9 % IJ SOLN
10.0000 mL | INTRAMUSCULAR | Status: DC | PRN
  Administered 2013-01-12 (×2): 10 mL
  Administered 2013-01-12: 30 mL
  Administered 2013-01-12: 10 mL
  Administered 2013-01-14: 30 mL
  Administered 2013-01-15: 20 mL
  Administered 2013-01-15: 30 mL
  Administered 2013-01-15: 10 mL
  Administered 2013-01-16 (×2): 30 mL
  Administered 2013-01-16: 20 mL
  Administered 2013-01-19 (×4): 10 mL
  Administered 2013-01-20: 20 mL
  Administered 2013-01-21 – 2013-01-25 (×3): 30 mL

## 2013-01-12 NOTE — Progress Notes (Signed)
[facility] PROGRESS NOTE Interim History: 57 y/o male who presented to ER after found on the floor by his mother. 3 days of diarrhea and cough - fever of 105 in the ER. Has a h/o Gout and HTN - has no h/o chronic renal failure as far as he knows. Last saw his doctor about 3 mo prior. Has no urinary complaints.     Assessment/Plan:  Sepsis due to PNA: - Resolved. -supportive care  -Legionella PNA and associated dehydration from diarrhea and poor PO intake and ARF   Acute hypoxic respiratory failure due to Legionella PNA  - resolved  - blood cx no growth  - Levaquin to complete 10 days of tx.  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  - Dr. Jeffie Pollock with Urology recommended outpt f/u with resection when more stable medically  - Will need vascular access in future, vein mapping.  ARF/metabolic acidosis/ATN  - Noted to be on ACE I at home  - Nephro feels is likely 2/2 ATN  - The possibility of HD this admission remains. - Cr cont to improve.  Malignant HYPERTENSION  -BP much improved today - is now off BB gtt - care to avoid over correction   Slurred speech / metabolic encephalopathy  - neuro eval much appreciated  - MRI head without acute changes  - EEG results still pending  - was likely due to progressive azotemia since worsened as renal function worsened  - has now stabilized/resovled   GOUT  - cont Allopurinol   Code Status: full Family Communication: none  Disposition Plan: inpatient   Consultants:  Urology  Nephrology  neurology  Procedures:  EEG  Antibiotics: Vanc/ Zosyn 10/25 >>> 10/26  Levaquin 10/27 >>>  Zithromax 10/25 >>> 10/27  Rocephin 10/25 >>> 10/26   HPI/Subjective: Frustrated with situation.  Objective: Filed Vitals:   01/11/13 1610 01/11/13 1823 01/11/13 2114 01/12/13 0350  BP: 146/78 153/83 137/79 132/84  Pulse:  73 72 71  Temp:  98.3 F (36.8 C) 98.1 F (36.7 C) 97.9 F  (36.6 C)  TempSrc:  Oral Oral Oral  Resp:  16 16 15   Height:  6\' 3"  (1.905 m)    Weight:  99.3 kg (218 lb 14.7 oz)    SpO2:  95% 98% 98%    Intake/Output Summary (Last 24 hours) at 01/12/13 1025 Last data filed at 01/12/13 0845  Gross per 24 hour  Intake    630 ml  Output   1025 ml  Net   -395 ml   Filed Weights   01/05/13 0500 01/08/13 0400 01/11/13 1823  Weight: 114.6 kg (252 lb 10.4 oz) 106.6 kg (235 lb 0.2 oz) 99.3 kg (218 lb 14.7 oz)    Exam:  General: Alert, awake, oriented x3, in no acute distress.  HEENT: No bruits, no goiter.  Heart: Regular rate and rhythm, without murmurs, rubs, gallops.  Lungs: Good air movement, clear to asucultation Abdomen: Soft, nontender, nondistended, positive bowel sounds.  Neuro: Grossly intact, nonfocal.   Data Reviewed: Basic Metabolic Panel:  Recent Labs Lab 01/09/13 0325 01/09/13 0535 01/10/13 0500 01/11/13 0500 01/12/13 0850  NA 141 140 138 137 138  K 3.4* 3.5 3.8 4.1 4.5  CL 100 99 96 97 98  CO2 25 25 25 26 25   GLUCOSE 106* 102* 105* 118* 156*  BUN 111* 107* 115* 116* 112*  CREATININE 8.63* 8.63* 8.62* 8.40* 7.98*  CALCIUM 8.7 8.4 9.1  9.0 9.2  PHOS 7.5* 7.4* 8.5* 7.7* 7.6*   Liver Function Tests:  Recent Labs Lab 01/09/13 0325 01/09/13 0535 01/10/13 0500 01/11/13 0500 01/12/13 0850  ALBUMIN 2.2* 2.2* 2.6* 2.4* 2.6*   No results found for this basename: LIPASE, AMYLASE,  in the last 168 hours No results found for this basename: AMMONIA,  in the last 168 hours CBC:  Recent Labs Lab 01/06/13 0430 01/07/13 0420 01/10/13 0500  WBC 6.8 5.4 9.7  HGB 11.8* 11.7* 12.5*  HCT 34.6* 33.9* 36.0*  MCV 81.6 80.1 82.2  PLT 203 265 433*   Cardiac Enzymes: No results found for this basename: CKTOTAL, CKMB, CKMBINDEX, TROPONINI,  in the last 168 hours BNP (last 3 results) No results found for this basename: PROBNP,  in the last 8760 hours CBG: No results found for this basename: GLUCAP,  in the last 168  hours  No results found for this or any previous visit (from the past 240 hour(s)).   Studies: No results found.  Scheduled Meds: . calcium acetate  1,334 mg Oral TID WC  . cloNIDine  0.2 mg Oral TID  . feeding supplement (RESOURCE BREEZE)  1 Container Oral TID BM  . heparin subcutaneous  5,000 Units Subcutaneous Q8H  . hydrALAZINE  25 mg Oral TID  . isosorbide dinitrate  5 mg Oral BID  . levofloxacin  500 mg Oral Q48H  . sodium bicarbonate  650 mg Oral BID  . Vitamin D (Ergocalciferol)  50,000 Units Oral Q7 days   Continuous Infusions: . sodium chloride 20 mL/hr at 01/08/13 [place], Brian Lawson  [facility] Pager [phone]. If 8PM-8AM, please contact night-coverage at [email or web address], password [facility] 01/12/2013, 10:25 AM  LOS: 11 days

## 2013-01-12 NOTE — Evaluation (Signed)
Occupational Therapy Evaluation Patient Details Name: Brian Lawson MRN: [id number] DOB: [birth year] Today's Date: 01/12/2013 Time: 1110-1141 OT Time Calculation (min): 31 min  OT Assessment / Plan / Recommendation History of present illness 57 y/o male who presented to ER after found on the floor by his mother. 3 days of diarrhea and cough - fever of 105 in the ER. Has a h/o Gout and HTN . Pt with legnionella PNA, severe sepsis, transient blindness, HTN, encephalopathy, renal mass   Clinical Impression   Pt demos decline in function with ADLs and ADL mobility safety and would benefit from acute OT services to address impairments to help restore PLOF. Pt refusing to participate in tranfers and OOB activity. OT educate on benefits of sitting up in recliner and being OOB as well as risks of spending excessive amounts of time in bed. Per PT eval pt, pt requires +2 assist with transfers    OT Assessment  Patient needs continued OT Services    Follow Up Recommendations  Home health OT;Supervision/Assistance - 24 hour;CIR    Barriers to Discharge   None  Equipment Recommendations  Other (comment) (TBD)    Recommendations for Other Services    Frequency  Min 2X/week    Precautions / Restrictions Precautions Precautions: Fall Restrictions Weight Bearing Restrictions: No   Pertinent Vitals/Pain No c/o    ADL  Grooming: Performed;Wash/dry hands;Wash/dry face;Supervision/safety;Set up Where Assessed - Grooming: Unsupported sitting Upper Body Bathing: Simulated;Supervision/safety;Set up Lower Body Bathing: Simulated;Minimal assistance Upper Body Dressing: Performed;Supervision/safety;Set up Lower Body Dressing: Performed;Minimal assistance;Moderate assistance Transfers/Ambulation Related to ADLs: pt refused to get OOB to ambulate to bathroom for toileting. Pt total A + 2 per PT eval ADL Comments: pt refused OOB activity    OT Diagnosis: Generalized weakness  OT Problem List: Decreased  knowledge of use of DME or AE;Decreased activity tolerance OT Treatment Interventions: Self-care/ADL training;Balance training;Therapeutic exercise;Neuromuscular education;Therapeutic activities;DME and/or AE instruction;Patient/family education   OT Goals(Current goals can be found in the care plan section) Acute Rehab OT Goals Patient Stated Goal: be able to return to school OT Goal Formulation: With patient Time For Goal Achievement: 01/19/13 Potential to Achieve Goals: Good ADL Goals Pt Will Perform Grooming: with set-up;with supervision;sitting;standing;with mod assist Pt Will Perform Lower Body Bathing: with min guard assist;with supervision;with set-up;sitting/lateral leans;sit to/from stand Pt Will Perform Lower Body Dressing: with min guard assist;with supervision;with set-up;sit to/from stand Pt Will Transfer to Toilet: with total assist;with max assist;bedside commode;regular height toilet;grab bars;ambulating Pt Will Perform Toileting - Clothing Manipulation and hygiene: with mod assist;with min assist Pt Will Perform Tub/Shower Transfer: with max assist;with mod assist  Visit Information  Last OT Received On: 01/12/13 Assistance Needed: +1 History of Present Illness: 57 y/o male who presented to ER after found on the floor by his mother. 3 days of diarrhea and cough - fever of 105 in the ER. Has a h/o Gout and HTN . 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 Communication: No difficulties Dominant  Hand: Left         Vision/Perception Vision - History Baseline Vision:  Wears contacts Patient Visual Report: No change from baseline Perception Perception: Within Functional Limits   Cognition  Cognition Arousal/Alertness: Awake/alert Behavior During Therapy: WFL for tasks assessed/performed Overall Cognitive Status: Within Functional Limits for tasks assessed    Extremity/Trunk Assessment Upper Extremity Assessment Upper Extremity Assessment: Overall WFL for tasks assessed Lower Extremity Assessment Lower Extremity Assessment: Defer to PT evaluation Cervical / Trunk Assessment Cervical / Trunk Assessment: Normal     Mobility Bed Mobility Supine to Sit: 5: Supervision;With rails;HOB elevated Sit to Supine: 6: Modified independent (Device/Increase time);HOB flat;With rail Details for Bed Mobility Assistance: pt initially refursng to get up from supine position and became agitiated. Pt eventually agreeable to sitting EOB, however continued to refuse to get OOB to ambulate to bathroom with OT Transfers Transfers: Not assessed Details for Transfer Assistance: pt refused to get OOB to ambulate to bathroom for toileting. Pt total A + 2 per PT eval     Exercise     Balance Balance Balance Assessed: Yes Dynamic Sitting Balance Dynamic Sitting - Balance Support: No upper extremity supported;Feet unsupported;During functional activity Dynamic Sitting - Level of Assistance: 5: Stand by assistance   End of Session OT - End of Session Activity Tolerance: Other (comment);Patient tolerated treatment well (pt refused any OOB activity) Patient left: in bed;with bed alarm set  GO     [name] 01/12/2013, 1:17 PM

## 2013-01-12 NOTE — Progress Notes (Signed)
S: Nausea better.  Has not seen Dx videos.  Not strong enough to walk on his own. O:BP 140/86  Pulse 71  Temp(Src) 97.9 F (36.6 C) (Oral)  Resp 15  Ht 6\' 3"  (1.905 m)  Wt 99.3 kg (218 lb 14.7 oz)  BMI 27.36 kg/m2  SpO2 98%  Intake/Output Summary (Last 24 hours) at 01/12/13 1031 Last data filed at 01/12/13 0845  Gross per 24 hour  Intake    630 ml  Output   1025 ml  Net   -395 ml   Weight change:  Gen: awake and alert CVS:RRR Resp: bronchial BS Lt base Abd:+ BS NT ND Ext:no edema NEURO:OX3, M&SI, no asterixis   . calcium acetate  1,334 mg Oral TID WC  . cloNIDine  0.2 mg Oral TID  . feeding supplement (RESOURCE BREEZE)  1 Container Oral TID BM  . heparin subcutaneous  5,000 Units Subcutaneous Q8H  . hydrALAZINE  25 mg Oral TID  . isosorbide dinitrate  5 mg Oral BID  . levofloxacin  500 mg Oral Q48H  . sodium bicarbonate  650 mg Oral BID  . Vitamin D (Ergocalciferol)  50,000 Units Oral Q7 days   No results found. BMET    Component Value Date/Time   NA 138 01/12/2013 0850   K 4.5 01/12/2013 0850   CL 98 01/12/2013 0850   CO2 25 01/12/2013 0850   GLUCOSE 156* 01/12/2013 0850   BUN 112* 01/12/2013 0850   CREATININE 7.98* 01/12/2013 0850   CALCIUM 9.2 01/12/2013 0850   GFRNONAA 7* 01/12/2013 0850   GFRAA 8* 01/12/2013 0850   CBC    Component Value Date/Time   WBC 9.7 01/10/2013 0500   RBC 4.38 01/10/2013 0500   HGB 12.5* 01/10/2013 0500   HCT 36.0* 01/10/2013 0500   PLT 433* 01/10/2013 0500   MCV 82.2 01/10/2013 0500   MCH 28.5 01/10/2013 0500   MCHC 34.7 01/10/2013 0500   RDW 16.6* 01/10/2013 0500   LYMPHSABS 1.0 01/01/2013 1859   MONOABS 0.7 01/01/2013 1859   EOSABS 0.0 01/01/2013 1859   BASOSABS 0.0 01/01/2013 1859     Assessment:  1. 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  Scr 7.98 2. Lt renal mass 3. PNA 4. HTN  Plan: 1. Show Dx videos today, charge nurse aware 2. Vein mapping 3.  I had a long discussion with regarding the reality of HD.  He is trying to deal with  it but I think in the end he will do what is needed.  Ideally, would like to get access in prior to DC 4. Daily Scr   Parminder Trapani T

## 2013-01-12 NOTE — Progress Notes (Signed)
Rehab admissions - Evaluated for possible admission.  Please see rehab consult done by Dr. Letta Pate recommending [facility].  Patient is already doing too well to qualify for in inpatient rehab stay.  Recommend HH therapies for follow up.  Call me for questions.  RC:[phone]

## 2013-01-13 DIAGNOSIS — N184 Chronic kidney disease, stage 4 (severe): Secondary | ICD-10-CM

## 2013-01-13 DIAGNOSIS — N179 Acute kidney failure, unspecified: Secondary | ICD-10-CM

## 2013-01-13 LAB — HIV ANTIBODY (ROUTINE TESTING W REFLEX): HIV: NONREACTIVE

## 2013-01-13 LAB — BASIC METABOLIC PANEL
BUN: 112 mg/dL — ABNORMAL HIGH (ref 6–23)
CO2: 25 mEq/L (ref 19–32)
Calcium: 9.3 mg/dL (ref 8.4–10.5)
GFR calc non Af Amer: 7 mL/min — ABNORMAL LOW (ref >90)
Glucose, Bld: 187 mg/dL — ABNORMAL HIGH (ref 70–99)
Potassium: 4.6 mEq/L (ref 3.5–5.1)
Sodium: 141 mEq/L (ref 135–145)

## 2013-01-13 MED ORDER — HYDRALAZINE HCL 50 MG PO TABS
50.0000 mg | ORAL_TABLET | Freq: Three times a day (TID) | ORAL | Status: DC
  Administered 2013-01-13 – 2013-01-25 (×32): 50 mg via ORAL
  Filled 2013-01-13 (×39): qty 1

## 2013-01-13 NOTE — Progress Notes (Signed)
[facility] PROGRESS NOTE Interim History: 57 y/o male who presented to ER after found on the floor by his mother. 3 days of diarrhea and cough - fever of 105 in the ER. Has a h/o Gout and HTN - has no h/o chronic renal failure as far as he knows. Last saw his doctor about 3 mo prior. Has no urinary complaints.     Assessment/Plan:  Sepsis due to PNA: - Now resolved, patient hemodynamically stable, nontoxic. He remains on Levaquin 500 mg by mouth q. 48 hours for renal dosing.  Acute hypoxic respiratory failure due to Legionella PNA  - Patient having significant improvement, remains on Levaquin 500 mg by mouth q. 48 hours for 10 day course.  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  - Dr. Jeffie Pollock with Urology recommended outpt f/u with resection when more stable medically  - Will need vascular access in future, vein mapping.  ARF/metabolic acidosis/ATN  - Noted to be on ACE I at home  - Nephro feels is likely 2/2 ATN  - The possibility of HD this admission remains. - Cr cont to improve.  Malignant HYPERTENSION  -BP much improved today - is now off BB gtt - care to avoid over correction   Slurred speech / metabolic encephalopathy  - neuro eval much appreciated  - MRI head without acute changes  - EEG results still pending  - was likely due to progressive azotemia since worsened as renal function worsened  - has now stabilized/resovled   GOUT  - cont Allopurinol   Code Status: full Family Communication: none  Disposition Plan: inpatient   Consultants:  Urology  Nephrology  neurology  Procedures:  EEG  Antibiotics: Vanc/ Zosyn 10/25 >>> 10/26  Levaquin 10/27 >>>  Zithromax 10/25 >>> 10/27  Rocephin 10/25 >>> 10/26   HPI/Subjective: Frustrated with situation.  Objective: Filed Vitals:   01/12/13 2156 01/13/13 0356 01/13/13 0950 01/13/13 1351  BP: 146/88 144/82 148/91 149/91  Pulse: 75 75 66 71  Temp:  98.4 F (36.9 C) 97.8 F (36.6 C) 98.1 F (36.7 C) 98.5 F (36.9 C)  TempSrc: Oral Oral    Resp: 18 17 18 19   Height: 6\' 3"  (1.905 m)     Weight: 101.334 kg (223 lb 6.4 oz)     SpO2: 94% 96% 97% 96%    Intake/Output Summary (Last 24 hours) at 01/13/13 1436 Last data filed at 01/13/13 1352  Gross per 24 hour  Intake    440 ml  Output   2125 ml  Net  -1685 ml   Filed Weights   01/08/13 0400 01/11/13 1823 01/12/13 2156  Weight: 106.6 kg (235 lb 0.2 oz) 99.3 kg (218 lb 14.7 oz) 101.334 kg (223 lb 6.4 oz)    Exam:  General: Alert, awake, oriented x3, in no acute distress.  HEENT: No bruits, no goiter.  Heart: Regular rate and rhythm, without murmurs, rubs, gallops.  Lungs: Good air movement, clear to asucultation Abdomen: Soft, nontender, nondistended, positive bowel sounds.  Neuro: Grossly intact, nonfocal.   Data Reviewed: Basic Metabolic Panel:  Recent Labs Lab 01/09/13 0325 01/09/13 0535 01/10/13 0500 01/11/13 0500 01/12/13 0850 01/13/13 0504  NA 141 140 138 137 138 141  K 3.4* 3.5 3.8 4.1 4.5 4.6  CL 100 99 96 97 98 102  CO2 25 25 25 26 25 25   GLUCOSE 106* 102* 105* 118* 156* 187*  BUN 111* 107* 115* 116*  112* 112*  CREATININE 8.63* 8.63* 8.62* 8.40* 7.98* 7.45*  CALCIUM 8.7 8.4 9.1 9.0 9.2 9.3  PHOS 7.5* 7.4* 8.5* 7.7* 7.6*  --    Liver Function Tests:  Recent Labs Lab 01/09/13 0325 01/09/13 0535 01/10/13 0500 01/11/13 0500 01/12/13 0850  ALBUMIN 2.2* 2.2* 2.6* 2.4* 2.6*   No results found for this basename: LIPASE, AMYLASE,  in the last 168 hours No results found for this basename: AMMONIA,  in the last 168 hours CBC:  Recent Labs Lab 01/07/13 0420 01/10/13 0500  WBC 5.4 9.7  HGB 11.7* 12.5*  HCT 33.9* 36.0*  MCV 80.1 82.2  PLT 265 433*   Cardiac Enzymes: No results found for this basename: CKTOTAL, CKMB, CKMBINDEX, TROPONINI,  in the last 168 hours BNP (last 3 results) No results found for this basename: PROBNP,  in the last 8760  hours CBG: No results found for this basename: GLUCAP,  in the last 168 hours  No results found for this or any previous visit (from the past 240 hour(s)).   Studies: No results found.  Scheduled Meds: . calcium acetate  1,334 mg Oral TID WC  . cloNIDine  0.2 mg Oral TID  . feeding supplement (RESOURCE BREEZE)  1 Container Oral TID BM  . heparin subcutaneous  5,000 Units Subcutaneous Q8H  . hydrALAZINE  50 mg Oral TID  . isosorbide dinitrate  5 mg Oral BID  . levofloxacin  500 mg Oral Q48H  . sodium bicarbonate  650 mg Oral BID  . Vitamin D (Ergocalciferol)  50,000 Units Oral Q7 days   Continuous Infusions: . sodium chloride 20 mL/hr at 01/08/13 1900     Kelvin Cellar  [facility] Pager [phone]. If 8PM-8AM, please contact night-coverage at [email or web address], password [facility] 01/13/2013, 2:36 PM  LOS: 12 days

## 2013-01-13 NOTE — Progress Notes (Signed)
01/13/2013 patient have been watching dialysis videos. [facility] Rn.

## 2013-01-13 NOTE — Progress Notes (Signed)
VASCULAR LAB PRELIMINARY  PRELIMINARY  PRELIMINARY  PRELIMINARY  Right  Upper Extremity Vein Map    Cephalic  Segment Diameter Depth Comment  1. Axilla mm mm Too small to measure  2. Mid upper arm mm mm Too small to measure  3. Above AC 0.8 mm mm   4. In AC 1.4 mm mm   5. Below AC mm mm Too small to measure  6. Mid forearm mm mm Too small to measure  7. Wrist mm mm Too small to measure   mm mm    mm mm    mm mm    Basilic  Segment Diameter Depth Comment  1. Axilla mm mm Origin in mid upper arm  2. Mid upper arm 3.8 mm 19.6 mm The branch is 2.8 mm at a depth of 12.5 mm  3. Above AC 3.4 mm 16 mm The branch is 2.5 mm at a depth of 6.6 mm  4. In AC mm mm The branch is 1.4 mm at a depth of 2.6 mm  5. Below AC mm mm The branch is 1.5 mm at a depth of 1.5 mm   mm mm    mm mm    mm mm    mm mm    mm mm    Left Upper Extremity Vein Map    Cephalic  Segment Diameter Depth Comment  1. Axilla mm mm Too small to measure  2. Mid upper arm 1.2 mm mm   3. Above AC 2.7 mm mm   4. In AC 3 mm mm   5. Below AC 1 mm mm   6. Mid forearm 1.1 mm mm   7. Wrist mm mm Too small to measure   mm mm    mm mm    mm mm    Basilic  Segment Diameter Depth Comment  1. Axilla 3.8 mm 14.2 mm   2. Mid upper arm 2.6 mm 13.6 mm   3. Above AC 2.6 mm 7.6 mm   4. In [facility] 3.1 mm 2.5 mm branch  5. Below AC 1.7 mm 1.5 mm    mm mm    mm mm    mm mm    mm mm    mm mm     Kamori Kitchens, RVT 01/13/2013, 1:14 PM

## 2013-01-13 NOTE — Progress Notes (Signed)
S: Has watched 2 of the videos on dialysis.  No N/V O:BP 148/91  Pulse 66  Temp(Src) 98.1 F (36.7 C) (Oral)  Resp 18  Ht 6\' 3"  (1.905 m)  Wt 101.334 kg (223 lb 6.4 oz)  BMI 27.92 kg/m2  SpO2 97%  Intake/Output Summary (Last 24 hours) at 01/13/13 1013 Last data filed at 01/13/13 0900  Gross per 24 hour  Intake    440 ml  Output   1775 ml  Net  -1335 ml   Weight change: 2.034 kg (4 lb 7.7 oz) Gen: awake and alert CVS:RRR Resp: bronchial BS Lt base Abd:+ BS NT ND Ext:no edema NEURO:OX3, M&SI, no asterixis   . calcium acetate  1,334 mg Oral TID WC  . cloNIDine  0.2 mg Oral TID  . feeding supplement (RESOURCE BREEZE)  1 Container Oral TID BM  . heparin subcutaneous  5,000 Units Subcutaneous Q8H  . hydrALAZINE  25 mg Oral TID  . isosorbide dinitrate  5 mg Oral BID  . levofloxacin  500 mg Oral Q48H  . sodium bicarbonate  650 mg Oral BID  . Vitamin D (Ergocalciferol)  50,000 Units Oral Q7 days   No results found. BMET    Component Value Date/Time   NA 141 01/13/2013 0504   K 4.6 01/13/2013 0504   CL 102 01/13/2013 0504   CO2 25 01/13/2013 0504   GLUCOSE 187* 01/13/2013 0504   BUN 112* 01/13/2013 0504   CREATININE 7.45* 01/13/2013 0504   CALCIUM 9.3 01/13/2013 0504   GFRNONAA 7* 01/13/2013 0504   GFRAA 8* 01/13/2013 0504   CBC    Component Value Date/Time   WBC 9.7 01/10/2013 0500   RBC 4.38 01/10/2013 0500   HGB 12.5* 01/10/2013 0500   HCT 36.0* 01/10/2013 0500   PLT 433* 01/10/2013 0500   MCV 82.2 01/10/2013 0500   MCH 28.5 01/10/2013 0500   MCHC 34.7 01/10/2013 0500   RDW 16.6* 01/10/2013 0500   LYMPHSABS 1.0 01/01/2013 1859   MONOABS 0.7 01/01/2013 1859   EOSABS 0.0 01/01/2013 1859   BASOSABS 0.0 01/01/2013 1859     Assessment:  1. 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  Scr 7.45 2. Lt renal mass 3. PNA 4. HTN  Plan: 1. Discussed again the inevitability of dialysis and that it would be best to get access in while he is here.  He is agreeable to this though he is still  trying to "wrap his head around it"  I called Bethena Roys at [facility] 2. Increase hydralazine to 50mg  tid  Leisha Trinkle T

## 2013-01-13 NOTE — Progress Notes (Signed)
Physical Therapy Treatment Patient Details Name: Brian Lawson MRN: [id number] DOB: Feb 26, 1956 Today's Date: 01/13/2013 Time: [id number] PT Time Calculation (min): 14 min  PT Assessment / Plan / Recommendation  History of Present Illness 57 y/o male who presented to ER after found on the floor by his mother. 3 days of diarrhea and cough - fever of 105 in the ER. Has a h/o Gout and HTN . Pt with legnionella PNA, severe sepsis, transient blindness, HTN, encephalopathy, renal mass   PT Comments   Pt D/C plans have changed to Home. Pt will need to increase mobility to determine DME needs prior to D/C home. Pt continues to refuse ambulation; c/o pain in Rt foot due to "gout". Pt required min (A) to standpivot transfer to chair with cues for safety. Will cont to follow up with per POC prior to D/C. D/C disposition updated.   Follow Up Recommendations  Home health PT;Supervision/Assistance - 24 hour     Does the patient have the potential to tolerate intense rehabilitation     Barriers to Discharge        Equipment Recommendations  Rolling walker with 5" wheels    Recommendations for Other Services OT consult  Frequency Min 3X/week   Progress towards PT Goals Progress towards PT goals: Progressing toward goals  Plan Discharge plan needs to be updated    Precautions / Restrictions Precautions Precautions: Fall Restrictions Weight Bearing Restrictions: No   Pertinent Vitals/Pain 7/10 in rt foot; pt stated "its my gout again"     Mobility  Bed Mobility Bed Mobility: Supine to Sit;Sitting - Scoot to Edge of Bed Supine to Sit: 5: Supervision;With rails;HOB elevated Sitting - Scoot to Edge of Bed: 5: Supervision Details for Bed Mobility Assistance: pt relies on hand rails and HOB elevated; no physical (A) needed; min cues for hand placement  Transfers Transfers: Sit to Stand;Stand to Sit;Stand Pivot Transfers Sit to Stand: 3: Mod assist;With upper extremity assist;From bed Stand to Sit:  4: Min assist;With armrests;With upper extremity assist;To chair/3-in-1 Stand Pivot Transfers: 4: Min assist;With armrests Details for Transfer Assistance: pt was agreeable to transfer to chair only; "i dont feel like walking today. this medicine makes me feel ugh"; pt required cues for hand placmeent and managment of RW; min (A) to perform SPT and control descent to chair; pt attempts to flop into chair  Ambulation/Gait Ambulation/Gait Assistance: Not tested (comment) (pt refused)    Exercises General Exercises - Lower Extremity Ankle Circles/Pumps: AROM;Both;10 reps;Supine Long Arc Quad: AROM;Both;10 reps;Seated (cues to control descent ) Hip Flexion/Marching: AROM;Both;10 reps;Seated   PT Diagnosis:    PT Problem List:   PT Treatment Interventions:     PT Goals (current goals can now be found in the care plan section) Acute Rehab PT Goals Patient 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 3 days of diarrhea and cough - fever of 105 in the ER. Has a h/o Gout and HTN . Pt with legnionella PNA, severe sepsis, transient blindness, HTN, encephalopathy, renal mass    Subjective Data  Subjective: Pt lying supine; agreeable to therapy "okay i guess. 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  Cognition  Cognition Arousal/Alertness: Awake/alert Behavior During Therapy: WFL for tasks assessed/performed Overall Cognitive Status: Within Functional Limits for tasks assessed    Balance  Balance Balance Assessed: Yes Static Sitting Balance Static Sitting - Balance Support: Feet supported;No upper extremity supported Static Sitting - Level of Assistance: 6: Modified  independent (Device/Increase time)  End of Session PT - End of Session Equipment Utilized During Treatment: Gait belt Activity Tolerance: Patient limited by fatigue;Patient limited by pain Patient left: in chair;with call bell/phone within reach Nurse Communication: Mobility status   GP     Brian Lawson, [place] 01/13/2013, 2:31 PM

## 2013-01-13 NOTE — Consult Note (Signed)
[facility] Consult  Reason for Consult:  ESRD Referring Physician:  Mercy Moore  [id number]  History of Present Illness: This is a 57 y.o. male who presented to the ED a little over a week ago after being found unresponsive in his home.  He states that he was not unconscious, just "off".  He was brought to the hospital by EMS and was oriented at arrival.  He has hx of fevers, chills and diarrhea for about a week.   CXR revealed LLL PNA.  His creatinine has continued to rise.  He did have a urine antigen for legionella was positive and is on Levaquin for PNA.  He underwent a renal ultrasound, which revealed a large mass on the left kidney and a urology consult is obtained.  An MRI was obtained, which confirmed an 8.4x9.5 cm complex cystic and solid mass off the lower/mid pole of the kidney. He is on Levoquin for PNA.  Past Medical History  Diagnosis Date  . Hypertension   . Gout    Past Surgical History  Procedure Laterality Date  . Hernia repair      No Known Allergies  Prior to Admission medications   Medication Sig Start Date End Date Taking? Authorizing Provider  allopurinol (ZYLOPRIM) 100 MG tablet Take 100 mg by mouth daily.   Yes Historical Provider, MD  lisinopril-hydrochlorothiazide (PRINZIDE,ZESTORETIC) 10-12.5 MG per tablet Take 1 tablet by mouth daily.   Yes Historical Provider, MD    Statin:  no Beta Blocker:  no Aspirin:  no ACEI:  no ARB:  no Other antiplatelets/anticoagulants:  no  History   Social History  . Marital Status: Single    Spouse Name: N/A    Number of Children: N/A  . Years of Education: N/A   Occupational History  . Not on file.   Social History Main Topics  . Smoking status: Former Research scientist (life sciences)  . Smokeless tobacco: Never Used  . Alcohol Use: Yes     Comment: ocassionally  . Drug Use: No  . Sexual Activity: Not on file   Other Topics Concern  . Not on file   Social History Narrative  . No narrative on file     Family Hx:  Mother is living at age 66 without medical issues Father deceased at age 21 with throat cancer Sister living age 26's with HTN  ROS: [x]  Positive   [ ]  Negative   [ ]  All sytems reviewed and are negative  Cardiovascular: []  chest pain/pressure []  palpitations []  SOB lying flat []  DOE []  pain in legs while walking []  pain in legs at rest []  pain in legs at night []  non-healing ulcers []  hx of DVT []  swelling in legs   Pulmonary: []  productive cough []  asthma/wheezing []  home O2  Neurologic: []  weakness in []  arms []  legs []  numbness in []  arms []  legs []  hx of CVA []  mini stroke [] difficulty speaking or slurred speech []  temporary loss of vision in one eye []  dizziness  Hematologic: []  hx of cancer []  bleeding problems []  problems with blood clotting easily  Endocrine:   []  diabetes []  thyroid disease  GI []  vomiting blood []  blood in stool [x]  diarrhea  GU: [x]  CKD4-continues to make urine/renal failure []  HD--[]  M/W/F or []  T/T/S []  burning with urination []  blood in urine [x]  kidney mass  Psychiatric: []  anxiety []  depression  Musculoskeletal: [x]  arthritis [x]  gout []  joint pain  Integumentary: []  rashes []  ulcers  Constitutional: [x]   fever [x]  chills   Physical Examination  Filed Vitals:   01/13/13 0950  BP: 148/91  Pulse: 66  Temp: 98.1 F (36.7 C)  Resp: 18   Body mass index is 27.92 kg/(m^2).  General:  WDWN in NAD Gait: Not observed HENT: WNL, normocephalic Eyes: Pupils equal Pulmonary: normal non-labored breathing, without Rales, rhonchi,  wheezing Cardiac: regular, without  Murmurs, rubs or gallops; without carotid bruits Abdomen: soft, NT/ND, no masses Skin: without rashes, without ulcers  Vascular Exam/Pulses:+ palpable radial and ulnar pulses bilaterally; + palpable pedal pulses bilaterally Extremities: without ischemic changes, without Gangrene , without cellulitis; without open wounds;   Musculoskeletal: no muscle wasting or atrophy  Neurologic: A&O X 3; Appropriate Affect ; SENSATION: normal; MOTOR FUNCTION:  moving all extremities equally. Speech is fluent/normal  CBC    Component Value Date/Time   WBC 9.7 01/10/2013 0500   RBC 4.38 01/10/2013 0500   HGB 12.5* 01/10/2013 0500   HCT 36.0* 01/10/2013 0500   PLT 433* 01/10/2013 0500   MCV 82.2 01/10/2013 0500   MCH 28.5 01/10/2013 0500   MCHC 34.7 01/10/2013 0500   RDW 16.6* 01/10/2013 0500   LYMPHSABS 1.0 01/01/2013 1859   MONOABS 0.7 01/01/2013 1859   EOSABS 0.0 01/01/2013 1859   BASOSABS 0.0 01/01/2013 1859    BMET    Component Value Date/Time   NA 141 01/13/2013 0504   K 4.6 01/13/2013 0504   CL 102 01/13/2013 0504   CO2 25 01/13/2013 0504   GLUCOSE 187* 01/13/2013 0504   BUN 112* 01/13/2013 0504   CREATININE 7.45* 01/13/2013 0504   CALCIUM 9.3 01/13/2013 0504   GFRNONAA 7* 01/13/2013 0504   GFRAA 8* 01/13/2013 0504    COAGS: Lab Results  Component Value Date   INR 1.17 01/03/2013     Non-Invasive Vascular Imaging:  Vein mapping has been ordered.  Pt states it was done this morning.   ASSESSMENT: This is a 57 y.o. male with CKD 4 in need of permanent HD access.  PLAN: -will await vein mapping and then formulate plan when to place access.  -will try to schedule this week. The pt is left handed.   Leontine Locket, PA-C [facility] [phone]  Agree with above assessment Veins are not good Appears that he will need basilic vein transposition He is left handed so we will schedule for right upper extremity basilic vein transposition on Friday although if found not to be suitable may require left upper extremity basilic vein transposition

## 2013-01-14 ENCOUNTER — Encounter (HOSPITAL_COMMUNITY): Payer: Self-pay | Admitting: Anesthesiology

## 2013-01-14 DIAGNOSIS — E872 Acidosis, unspecified: Secondary | ICD-10-CM

## 2013-01-14 LAB — BASIC METABOLIC PANEL
CO2: 26 mEq/L (ref 19–32)
Calcium: 9.5 mg/dL (ref 8.4–10.5)
Creatinine, Ser: 6.88 mg/dL — ABNORMAL HIGH (ref 0.50–1.35)
GFR calc non Af Amer: 8 mL/min — ABNORMAL LOW (ref >90)
Potassium: 5.3 mEq/L — ABNORMAL HIGH (ref 3.5–5.1)
Sodium: 141 mEq/L (ref 135–145)

## 2013-01-14 LAB — CBC
Hemoglobin: 11.4 g/dL — ABNORMAL LOW (ref 13.0–17.0)
MCV: 86.1 fL (ref 78.0–100.0)
Platelets: 419 10*3/uL — ABNORMAL HIGH (ref 150–400)
RBC: 4.03 MIL/uL — ABNORMAL LOW (ref 4.22–5.81)
WBC: 7.6 10*3/uL (ref 4.0–10.5)

## 2013-01-14 MED ORDER — CEFAZOLIN SODIUM-DEXTROSE 2-3 GM-% IV SOLR
2.0000 g | INTRAVENOUS | Status: AC
  Filled 2013-01-14: qty 50

## 2013-01-14 MED ORDER — CALCITRIOL 0.25 MCG PO CAPS
0.2500 ug | ORAL_CAPSULE | Freq: Every day | ORAL | Status: DC
  Administered 2013-01-14 – 2013-01-25 (×11): 0.25 ug via ORAL
  Filled 2013-01-14 (×13): qty 1

## 2013-01-14 MED ORDER — CEFAZOLIN SODIUM 1-5 GM-% IV SOLN
1.0000 g | INTRAVENOUS | Status: DC
  Filled 2013-01-14: qty 50

## 2013-01-14 MED ORDER — SODIUM POLYSTYRENE SULFONATE 15 GM/60ML PO SUSP
30.0000 g | Freq: Once | ORAL | Status: AC
  Administered 2013-01-14: 30 g via ORAL
  Filled 2013-01-14: qty 120

## 2013-01-14 NOTE — Progress Notes (Signed)
Patient ID: Brian Lawson, male   DOB: 01-Dec-1955, 57 y.o.   MRN: [id number] Plan right basilic vein transposition tomorrow if feasible Discussed with patient and he is agreeable to proceed Cephalic veins inadequate for brachial cephalic fistula bilaterally according to vein mapping

## 2013-01-14 NOTE — Progress Notes (Signed)
Physical Therapy Treatment Patient Details Name: Brian Lawson MRN: [id number] DOB: Sep 27, 1955 Today's Date: 01/14/2013 Time: [id number] PT Time Calculation (min): 10 min  PT Assessment / Plan / Recommendation  History of Present Illness 57 y/o male who presented to ER after found on the floor by his mother. 3 days of diarrhea and cough - fever of 105 in the ER. Has a h/o Gout and HTN . Pt with legnionella PNA, severe sepsis, transient blindness, HTN, encephalopathy, renal mass   PT Comments   Pt was agreeable to ambulate with max encouragement. Pt stated "i just have a lot on my mind". Pt very self limiting today. Had some inconsistencies with gt and following safety cues when ambulating. Pt is a fall risk. Would benefit from RW upon acute D/C. Pt is planning to have 24/7 (A) from mother upon D/C.   Follow Up Recommendations  Home health PT;Supervision/Assistance - 24 hour     Does the patient have the potential to tolerate intense rehabilitation     Barriers to Discharge        Equipment Recommendations  Rolling walker with 5" wheels    Recommendations for Other Services OT consult  Frequency Min 3X/week   Progress towards PT Goals Progress towards PT goals: Progressing toward goals  Plan Current plan remains appropriate    Precautions / Restrictions Precautions Precautions: Fall Restrictions Weight Bearing Restrictions: No   Pertinent Vitals/Pain No c/o pain.     Mobility  Bed Mobility Bed Mobility: Supine to Sit;Sitting - Scoot to Edge of Bed Supine to Sit: 6: Modified independent (Device/Increase time);HOB flat;With rails Sitting - Scoot to Edge of Bed: 6: Modified independent (Device/Increase time) Details for Bed Mobility Assistance: HOB flattened to simulate home enviroment; pt required incr time and hand rail for supine to sit  Transfers Transfers: Sit to Stand;Stand to Sit Sit to Stand: From bed;4: Min guard;With upper extremity assist Stand to Sit: 4: Min  guard;To chair/3-in-1;With upper extremity assist;With armrests Details for Transfer Assistance: min guard for transfers to steady when rising to standing position and to control descent to chari; pt tends to flop into chair and demo decr safety awareness with transfers; mod verbal cues for hand placement and sequencing  Ambulation/Gait Ambulation/Gait Assistance: 4: Min assist Ambulation Distance (Feet): 18 Feet Assistive device: Rolling walker Ambulation/Gait Assistance Details: cues for RW management and safety with gt; pt unsteady when fatigued with gt; requires (A) to manage RW and demo LOB with turning; pt inconsistent with following cues for safety; pt is a fall risk  Gait Pattern: Step-through pattern;Trunk flexed;Narrow base of support;Ataxic Gait velocity: decreased  Stairs: No Wheelchair Mobility Wheelchair Mobility: No    Exercises General Exercises - Lower Extremity Ankle Circles/Pumps: AROM;Both;10 reps;Supine   PT Diagnosis:    PT Problem List:   PT Treatment Interventions:     PT Goals (current goals can now be found in the care plan section) Acute Rehab PT Goals Patient 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 3 days of diarrhea and cough - fever of 105 in the ER. Has a h/o Gout and HTN . Pt with legnionella PNA, severe sepsis, transient blindness, HTN, encephalopathy, renal mass    Subjective Data  Subjective: pt lying supine; "so what are we going to do? just get in the chair? 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    Cognition  Cognition Arousal/Alertness: Awake/alert Behavior During Therapy: Impulsive Overall Cognitive Status: Within Functional  Limits for tasks assessed Area of Impairment: Safety/judgement Safety/Judgement: Decreased awareness of safety;Decreased awareness of deficits    Balance  Balance Balance Assessed: Yes Static Sitting Balance Static Sitting - Balance Support: Feet supported;No upper extremity supported Static Sitting - Level of Assistance: 6: Modified independent (Device/Increase time) Static Sitting - Comment/# of Minutes: tolerated EOB ~3 min to prepare for gt   End of Session PT - End of Session Equipment Utilized During Treatment: Gait belt Activity Tolerance: Patient limited by fatigue;Other (comment) (mentally self limiting ) Patient left: in chair;with call bell/phone within reach Nurse Communication: Mobility status   GP     Gustavus Bryant, [place] 01/14/2013, 9:38 AM

## 2013-01-14 NOTE — Progress Notes (Signed)
S: Has watched 2 of the videos on dialysis.  No N/V O:BP 146/92  Pulse 78  Temp(Src) 98.1 F (36.7 C) (Oral)  Resp 18  Ht 6\' 3"  (1.905 m)  Wt 101.016 kg (222 lb 11.2 oz)  BMI 27.84 kg/m2  SpO2 94%  Intake/Output Summary (Last 24 hours) at 01/14/13 1026 Last data filed at 01/14/13 0926  Gross per 24 hour  Intake    480 ml  Output   1275 ml  Net   -795 ml   Weight change: -0.318 kg (-11.2 oz) Gen: awake and alert CVS:RRR Resp: bronchial BS Lt base Abd:+ BS NT ND Ext:no edema NEURO:OX3, M&SI, no asterixis   . calcium acetate  1,334 mg Oral TID WC  . cloNIDine  0.2 mg Oral TID  . feeding supplement (RESOURCE BREEZE)  1 Container Oral TID BM  . heparin subcutaneous  5,000 Units Subcutaneous Q8H  . hydrALAZINE  50 mg Oral TID  . isosorbide dinitrate  5 mg Oral BID  . levofloxacin  500 mg Oral Q48H  . sodium bicarbonate  650 mg Oral BID  . Vitamin D (Ergocalciferol)  50,000 Units Oral Q7 days   No results found. BMET    Component Value Date/Time   NA 141 01/14/2013 0456   K 5.3* 01/14/2013 0456   CL 102 01/14/2013 0456   CO2 26 01/14/2013 0456   GLUCOSE 109* 01/14/2013 0456   BUN 106* 01/14/2013 0456   CREATININE 6.88* 01/14/2013 0456   CALCIUM 9.5 01/14/2013 0456   GFRNONAA 8* 01/14/2013 0456   GFRAA 9* 01/14/2013 0456   CBC    Component Value Date/Time   WBC 7.6 01/14/2013 0456   RBC 4.03* 01/14/2013 0456   HGB 11.4* 01/14/2013 0456   HCT 34.7* 01/14/2013 0456   PLT 419* 01/14/2013 0456   MCV 86.1 01/14/2013 0456   MCH 28.3 01/14/2013 0456   MCHC 32.9 01/14/2013 0456   RDW 17.0* 01/14/2013 0456   LYMPHSABS 1.0 01/01/2013 1859   MONOABS 0.7 01/01/2013 1859   EOSABS 0.0 01/01/2013 1859   BASOSABS 0.0 01/01/2013 1859     Assessment:  1. 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  Scr down to 6.8 2. Lt renal mass, will need nephrectomy 3. PNA 4. HTN 5. Mild hyperkalemia 6. Sec HPTH  Plan: 1. Note plans for BVT in AM 2. Dose of kayexalate 3. Start calcitriol 4. Recheck labs  in AM  Brian Lawson

## 2013-01-14 NOTE — Progress Notes (Signed)
Occupational Therapy Treatment Patient Details Name: Brian Lawson MRN: [id number] DOB: Aug 09, 1955 Today's Date: 01/14/2013 Time: [id number] OT Time Calculation (min): 17 min  OT Assessment / Plan / Recommendation  History of present illness 57 y/o male who presented to ER after found on the floor by his mother. 3 days of diarrhea and cough - fever of 105 in the ER. Has a h/o Gout and HTN . Pt with legnionella PNA, severe sepsis, transient blindness, HTN, encephalopathy, renal mass   OT comments  Pt making slow progress with functional goals. Pt agreeable to ADL mobility but declined ADLs. Pt states that he is in denial about having to have HD and it is heavy on his mind  Follow Up Recommendations  Home health OT;Supervision/Assistance - 24 hour;CIR    Barriers to Discharge   None    Equipment Recommendations  Other (comment) (TBD)    Recommendations for Other Services    Frequency Min 2X/week   Progress towards OT Goals Progress towards OT goals: Progressing toward goals  Plan Discharge plan remains appropriate    Precautions / Restrictions Precautions Precautions: Fall Restrictions Weight Bearing Restrictions: No   Pertinent Vitals/Pain No c/o pain    ADL  Toilet Transfer: Performed;Min guard Toilet Transfer Method: Sit to Loss adjuster, chartered: Therapist, occupational and Hygiene: Performed;Minimal assistance Where Assessed - Best boy and Hygiene: Standing Tub/Shower Transfer Method: Not assessed Transfers/Ambulation Related to ADLs: cues for correct hand placement, speed and control of descent ADL Comments: pt declined ADLs stating that he is still in denial about needing dialysis and slo with c/o nausea    OT Diagnosis:    OT Problem List:   OT Treatment Interventions:     OT Goals(current goals can now be found in the care plan section)    Visit Information  Last OT Received On: 01/14/13 Assistance  Needed: +1 History of Present Illness: 57 y/o male who presented to ER after found on the floor by his mother. 3 days of diarrhea and cough - fever of 105 in the ER. Has a h/o Gout and HTN . Pt with legnionella PNA, severe sepsis, transient blindness, HTN, encephalopathy, renal mass    Subjective Data      Prior Functioning       Cognition  Cognition Arousal/Alertness: Awake/alert Behavior During Therapy: Impulsive Overall Cognitive Status: Within Functional Limits for tasks assessed Area of Impairment: Safety/judgement Following Commands: Follows one step commands with increased time Safety/Judgement: Decreased awareness of safety;Decreased awareness of deficits    Mobility  Bed Mobility Bed Mobility: Not assessed Details for Bed Mobility Assistance: pt up in recliner Transfers Transfers: Sit to Stand;Stand to Sit Sit to Stand: 4: Min guard;With upper extremity assist;From chair/3-in-1 Stand to Sit: To chair/3-in-1;With upper extremity assist;4: Min guard Details for Transfer Assistance: min guard A for transfers to steady for sit - stand transition and control of descent     Exercises      Balance Static Standing Balance Static Standing - Balance Support: No upper extremity supported Static Standing - Level of Assistance: 4: Min assist Dynamic Standing Balance Dynamic Standing - Balance Support: No upper extremity supported;During functional activity Dynamic Standing - Level of Assistance: 4: Min assist;3: Mod assist   End of Session OT - End of Session Equipment Utilized During Treatment: Rolling walker;Other (comment) (BSC) Activity Tolerance: Patient limited by fatigue Patient left: in chair;with call bell/phone within reach  GO     Brian Lawson 01/14/2013,  1:50 PM

## 2013-01-14 NOTE — Progress Notes (Signed)
[facility] PROGRESS NOTE Interim History: 57 y/o male who presented to ER after found on the floor by his mother. 3 days of diarrhea and cough - fever of 105 in the ER. Has a h/o Gout and HTN - has no h/o chronic renal failure as far as he knows. Last saw his doctor about 3 mo prior. Has no urinary complaints.     Assessment/Plan:  Sepsis due to PNA: - Now resolved, patient hemodynamically stable, nontoxic. He remains on Levaquin 500 mg by mouth q. 48 hours for renal dosing.  Acute hypoxic respiratory failure due to Legionella PNA  - Patient having significant improvement, remains on Levaquin 500 mg by mouth q. 48 hours for 10 day course.  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  - Dr. Jeffie Pollock with Urology recommended outpt f/u with resection when more stable medically   ARF/metabolic acidosis/ATN  -Hemodialysis a likely future intervention. Creatinine trended down to 6.8 on this mornings lab work. Patient to undergo right basilic vein transposition on 01/15/2013. We'll continue following a.m. BMP.  Malignant HYPERTENSION  -Blood pressure stable, with today's systolic blood pressures fluctuate between 136 and 146. Continue hydralazine and isosorbide mononitrate  GOUT  - cont Allopurinol   Code Status: full Family Communication: none  Disposition Plan: inpatient   Consultants:  Urology  Nephrology  neurology  Procedures:  EEG  Antibiotics: Vanc/ Zosyn 10/25 >>> 10/26  Levaquin 10/27 >>>  Zithromax 10/25 >>> 10/27  Rocephin 10/25 >>> 10/26   HPI/Subjective: Patient today reports feeling weak, was held out of bed to chair. He denies chest pain, fevers, chills, nausea or vomiting.  Objective: Filed Vitals:   01/13/13 1558 01/13/13 2127 01/14/13 0500 01/14/13 0925  BP: 157/72 158/94 136/87 146/92  Pulse: 60 70 79 78  Temp: 97.7 F (36.5 C) 98.9 F (37.2 C) 98.8 F (37.1 C) 98.1 F (36.7 C)  TempSrc:  Oral  Oral Oral  Resp: 17 18 16 18   Height:  6\' 3"  (1.905 m)    Weight:  101.016 kg (222 lb 11.2 oz)    SpO2: 99% 94% 95% 94%    Intake/Output Summary (Last 24 hours) at 01/14/13 1308 Last data filed at 01/14/13 1251  Gross per 24 hour  Intake    480 ml  Output   1650 ml  Net  -1170 ml   Filed Weights   01/11/13 1823 01/12/13 2156 01/13/13 2127  Weight: 99.3 kg (218 lb 14.7 oz) 101.334 kg (223 lb 6.4 oz) 101.016 kg (222 lb 11.2 oz)    Exam:  General: Alert, awake, oriented x3, in no acute distress.  HEENT: No bruits, no goiter.  Heart: Regular rate and rhythm, without murmurs, rubs, gallops.  Lungs: Good air movement, clear to asucultation Abdomen: Soft, nontender, nondistended, positive bowel sounds.  Neuro: Grossly intact, nonfocal.   Data Reviewed: Basic Metabolic Panel:  Recent Labs Lab 01/09/13 0325 01/09/13 0535 01/10/13 0500 01/11/13 0500 01/12/13 0850 01/13/13 0504 01/14/13 0456  NA 141 140 138 137 138 141 141  K 3.4* 3.5 3.8 4.1 4.5 4.6 5.3*  CL 100 99 96 97 98 102 102  CO2 25 25 25 26 25 25 26   GLUCOSE 106* 102* 105* 118* 156* 187* 109*  BUN 111* 107* 115* 116* 112* 112* 106*  CREATININE 8.63* 8.63* 8.62* 8.40* 7.98* 7.45* 6.88*  CALCIUM 8.7 8.4 9.1 9.0 9.2 9.3 9.5  PHOS 7.5* 7.4* 8.5* 7.7* 7.6*  --   --  Liver Function Tests:  Recent Labs Lab 01/09/13 0325 01/09/13 0535 01/10/13 0500 01/11/13 0500 01/12/13 0850  ALBUMIN 2.2* 2.2* 2.6* 2.4* 2.6*   No results found for this basename: LIPASE, AMYLASE,  in the last 168 hours No results found for this basename: AMMONIA,  in the last 168 hours CBC:  Recent Labs Lab 01/10/13 0500 01/14/13 0456  WBC 9.7 7.6  HGB 12.5* 11.4*  HCT 36.0* 34.7*  MCV 82.2 86.1  PLT 433* 419*   Cardiac Enzymes: No results found for this basename: CKTOTAL, CKMB, CKMBINDEX, TROPONINI,  in the last 168 hours BNP (last 3 results) No results found for this basename: PROBNP,  in the last 8760 hours CBG: No results  found for this basename: GLUCAP,  in the last 168 hours  No results found for this or any previous visit (from the past 240 hour(s)).   Studies: No results found.  Scheduled Meds: . calcitRIOL  0.25 mcg Oral Daily  . calcium acetate  1,334 mg Oral TID WC  . [START ON 01/15/2013]  ceFAZolin (ANCEF) IV  1 g Intravenous On Call  . cloNIDine  0.2 mg Oral TID  . feeding supplement (RESOURCE BREEZE)  1 Container Oral TID BM  . heparin subcutaneous  5,000 Units Subcutaneous Q8H  . hydrALAZINE  50 mg Oral TID  . isosorbide dinitrate  5 mg Oral BID  . levofloxacin  500 mg Oral Q48H  . sodium bicarbonate  650 mg Oral BID  . sodium polystyrene  30 g Oral Once  . Vitamin D (Ergocalciferol)  50,000 Units Oral Q7 days   Continuous Infusions: . sodium chloride 20 mL/hr at 01/08/13 1900     Kelvin Cellar  [facility] Pager [phone]. If 8PM-8AM, please contact night-coverage at [email or web address], password [facility] 01/14/2013, 1:08 PM  LOS: 13 days

## 2013-01-15 ENCOUNTER — Encounter (HOSPITAL_COMMUNITY)
Admission: EM | Disposition: A | Discharge status: Skilled Nursing Facility | Payer: Self-pay | Source: Home / Self Care | Attending: Internal Medicine

## 2013-01-15 LAB — RENAL FUNCTION PANEL
BUN: 98 mg/dL — ABNORMAL HIGH (ref 6–23)
CO2: 25 mEq/L (ref 19–32)
Creatinine, Ser: 6.1 mg/dL — ABNORMAL HIGH (ref 0.50–1.35)
GFR calc Af Amer: 11 mL/min — ABNORMAL LOW (ref >90)
Glucose, Bld: 102 mg/dL — ABNORMAL HIGH (ref 70–99)
Phosphorus: 7 mg/dL — ABNORMAL HIGH (ref 2.3–4.6)
Potassium: 4.7 mEq/L (ref 3.5–5.1)
Sodium: 140 mEq/L (ref 135–145)

## 2013-01-15 SURGERY — TRANSPOSITION, VEIN, BASILIC
Anesthesia: Monitor Anesthesia Care | Laterality: Right

## 2013-01-15 MED ORDER — BIOTENE DRY MOUTH MT LIQD
15.0000 mL | Freq: Two times a day (BID) | OROMUCOSAL | Status: DC
  Administered 2013-01-15 – 2013-01-25 (×17): 15 mL via OROMUCOSAL
  Filled 2013-01-15: qty 15

## 2013-01-15 NOTE — Progress Notes (Signed)
Patient ID: Brian Lawson, male   DOB: 1955-07-19, 57 y.o.   MRN: [id number] Patient refused surgery today Had long discussion with him yesterday and today regarding need for fistula States he is not ready to proceed at the present time has too many questions Tried to answer every question he had but he is very uncertain about what he wants to do  Please notify us if patient decides to proceed with right basilic vein transposition we will then reschedule

## 2013-01-15 NOTE — Progress Notes (Signed)
PT Cancellation Note  Patient Details Name: Brian Lawson MRN: [id number] DOB: [birth year]   Cancelled Treatment:    Reason Eval/Treat Not Completed: Patient declined, no reason specified. Pt tearful and refusing therapy at this time. "I just dont feel like walking right now, i have too much going on." Will re-attempt at later time when pt is agreeable.   Elie Confer Donaldson, [place] 01/15/2013, 12:55 PM

## 2013-01-15 NOTE — Plan of Care (Signed)
Problem: Food- and Nutrition-Related Knowledge Deficit (NB-1.1) Goal: Nutrition education Formal process to instruct or train a patient/client in a skill or to impart knowledge to help patients/clients voluntarily manage or modify food choices and eating behavior to maintain or improve health. Outcome: Completed/Met Date Met:  01/15/13 Nutrition Education Note  RD consulted for Renal Education. Provided Choose-A-Meal Booklet and recipes to patient/family. Reviewed food groups and provided written recommended serving sizes specifically determined for patient's current nutritional status.   Explained why diet restrictions are needed and provided lists of foods to limit/avoid that are high potassium, sodium, and phosphorus. Provided specific recommendations on safer alternatives of these foods. Strongly encouraged compliance of this diet.   Discussed importance of protein intake at each meal and snack. Provided examples of how to maximize protein intake throughout the day. Discussed need for fluid restriction with dialysis, importance of minimizing weight gain between HD treatments, and renal-friendly beverage options.  Encouraged pt to discuss specific diet questions/concerns with RD at outpatient facility. Teach back method used.  Expect good compliance.  Body mass index is 26.42 kg/(m^2). Pt meets criteria for overweight based on current BMI.  Current diet order is renal 80/20 with 1200 ml fluid restriction, patient is consuming approximately 75% of meals at this time. Labs and medications reviewed. No further nutrition interventions warranted at this time. RD contact information provided. If additional nutrition issues arise, please re-consult RD.  Terrace Arabia RD, LDN

## 2013-01-15 NOTE — Progress Notes (Signed)
[facility] PROGRESS NOTE Interim History: 57 y/o male who presented to ER after found on the floor by his mother. 3 days of diarrhea and cough - fever of 105 in the ER. Has a h/o Gout and HTN - has no h/o chronic renal failure as far as he knows. Last saw his doctor about 3 mo prior. Has no urinary complaints.     Assessment/Plan:.  Acute hypoxic respiratory failure due to Legionella PNA  - Patient having significant improvement, remains on Levaquin 500 mg by mouth q. 48 hours for 10 day course.  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  - Dr. Jeffie Pollock with Urology recommended outpt f/u with resection when more stable medically   Acute renal failure -Extensive discussion held this morning regarding his current medical condition, left kidney mass, and need for left nephrectomy. I explained that hemodialysis would be a likely future intervention. Nephrology following, appreciate assistance. Nephrology explaining to him the risks of long-term catheter use versus AV fistula placement. If kidney function continues to improve may consider discharge over weekend.  Malignant HYPERTENSION  -Blood pressure stable, with today's systolic blood pressures fluctuate between 136 and 146. Continue hydralazine and isosorbide mononitrate  GOUT  - cont Allopurinol   Code Status: full Family Communication: none  Disposition Plan: Will monitor any function over weekend, if he does not consent to AV fistula placement and his kidney function continues to improve, will need PermCath prior to discharge.   Consultants:  Urology  Nephrology  neurology  Procedures:  EEG  Antibiotics: Vanc/ Zosyn 10/25 >>> 10/26  Levaquin 10/27 >>>  Zithromax 10/25 >>> 10/27  Rocephin 10/25 >>> 10/26   HPI/Subjective: Patient refusing fistula placement this morning. He reports that he has not been given enough information regarding AV fistula placement,  indications, risks and benefits. I spent an extensive amount of time this morning with him, we reviewed MRI showing left renal mass, and the need for nephrectomy and the probable future hemodialysis, as AV fistula placement would then be needed for HD. Patient still undecided. His diet was advanced to renal diet. Otherwise he denies fevers chills nausea vomiting tolerating by mouth intake.  Objective: Filed Vitals:   01/14/13 1821 01/14/13 2233 01/15/13 0516 01/15/13 1039  BP: 159/83 143/82 153/91 137/82  Pulse: 74 78 85 78  Temp: 98.5 F (36.9 C) 97.8 F (36.6 C) 98.2 F (36.8 C) 98.3 F (36.8 C)  TempSrc: Oral Oral Oral Oral  Resp:  17 16 18   Height:  6\' 3"  (1.905 m)    Weight:  95.89 kg (211 lb 6.4 oz)    SpO2: 95% 98% 98% 98%    Intake/Output Summary (Last 24 hours) at 01/15/13 1305 Last data filed at 01/15/13 0732  Gross per 24 hour  Intake    240 ml  Output    725 ml  Net   -485 ml   Filed Weights   01/12/13 2156 01/13/13 2127 01/14/13 2233  Weight: 101.334 kg (223 lb 6.4 oz) 101.016 kg (222 lb 11.2 oz) 95.89 kg (211 lb 6.4 oz)    Exam:  General: Alert, awake, oriented x3, in no acute distress.  HEENT: No bruits, no goiter.  Heart: Regular rate and rhythm, without murmurs, rubs, gallops.  Lungs: Good air movement, clear to asucultation Abdomen: Soft, nontender, nondistended, positive bowel sounds.  Neuro: Grossly intact, nonfocal.   Data Reviewed: Basic Metabolic Panel:  Recent Labs Lab 01/09/13 0535  01/10/13 0500 01/11/13 0500 01/12/13 0850 01/13/13 0504 01/14/13 0456 01/15/13 0500  NA 140 138 137 138 141 141 140  K 3.5 3.8 4.1 4.5 4.6 5.3* 4.7  CL 99 96 97 98 102 102 103  CO2 25 25 26 25 25 26 25   GLUCOSE 102* 105* 118* 156* 187* 109* 102*  BUN 107* 115* 116* 112* 112* 106* 98*  CREATININE 8.63* 8.62* 8.40* 7.98* 7.45* 6.88* 6.10*  CALCIUM 8.4 9.1 9.0 9.2 9.3 9.5 9.3  PHOS 7.4* 8.5* 7.7* 7.6*  --   --  7.0*   Liver Function Tests:  Recent  Labs Lab 01/09/13 0535 01/10/13 0500 01/11/13 0500 01/12/13 0850 01/15/13 0500  ALBUMIN 2.2* 2.6* 2.4* 2.6* 2.5*   No results found for this basename: LIPASE, AMYLASE,  in the last 168 hours No results found for this basename: AMMONIA,  in the last 168 hours CBC:  Recent Labs Lab 01/10/13 0500 01/14/13 0456  WBC 9.7 7.6  HGB 12.5* 11.4*  HCT 36.0* 34.7*  MCV 82.2 86.1  PLT 433* 419*   Cardiac Enzymes: No results found for this basename: CKTOTAL, CKMB, CKMBINDEX, TROPONINI,  in the last 168 hours BNP (last 3 results) No results found for this basename: PROBNP,  in the last 8760 hours CBG: No results found for this basename: GLUCAP,  in the last 168 hours  No results found for this or any previous visit (from the past 240 hour(s)).   Studies: No results found.  Scheduled Meds: . calcitRIOL  0.25 mcg Oral Daily  . calcium acetate  1,334 mg Oral TID WC  .  ceFAZolin (ANCEF) IV  2 g Intravenous On Call  . cloNIDine  0.2 mg Oral TID  . feeding supplement (RESOURCE BREEZE)  1 Container Oral TID BM  . heparin subcutaneous  5,000 Units Subcutaneous Q8H  . hydrALAZINE  50 mg Oral TID  . isosorbide dinitrate  5 mg Oral BID  . levofloxacin  500 mg Oral Q48H  . sodium bicarbonate  650 mg Oral BID  . Vitamin D (Ergocalciferol)  50,000 Units Oral Q7 days   Continuous Infusions: . sodium chloride 20 mL/hr at 01/08/13 1900     Kelvin Cellar  [facility] Pager [phone]. If 8PM-8AM, please contact night-coverage at [email or web address], password [facility] 01/15/2013, 1:05 PM  LOS: 14 days

## 2013-01-15 NOTE — Progress Notes (Signed)
Spoke with pt this am regarding signing consent for dialysis access placement.  Pt states at this time he "does not want to sign and everything still feels to ify". Pt educated on why the procedure was scheduled. Pt reports he "wants to see what his kidney's will do first". No consent for procedure at this time. Pt remains NPO should he change his mind Laurance Flatten, Roberts Gaudy

## 2013-01-15 NOTE — Progress Notes (Signed)
S: He decided not to go ahead and get AVF placed O:BP 137/82  Pulse 78  Temp(Src) 98.3 F (36.8 C) (Oral)  Resp 18  Ht 6\' 3"  (1.905 m)  Wt 95.89 kg (211 lb 6.4 oz)  BMI 26.42 kg/m2  SpO2 98%  Intake/Output Summary (Last 24 hours) at 01/15/13 1053 Last data filed at 01/15/13 0732  Gross per 24 hour  Intake    240 ml  Output   1100 ml  Net   -860 ml   Weight change: -5.126 kg (-11 lb 4.8 oz) Gen: awake and alert CVS:RRR Resp: bronchial BS Lt base Abd:+ BS NT ND Ext:no edema NEURO:OX3, M&SI, no asterixis   . calcitRIOL  0.25 mcg Oral Daily  . calcium acetate  1,334 mg Oral TID WC  .  ceFAZolin (ANCEF) IV  2 g Intravenous On Call  . cloNIDine  0.2 mg Oral TID  . feeding supplement (RESOURCE BREEZE)  1 Container Oral TID BM  . heparin subcutaneous  5,000 Units Subcutaneous Q8H  . hydrALAZINE  50 mg Oral TID  . isosorbide dinitrate  5 mg Oral BID  . levofloxacin  500 mg Oral Q48H  . sodium bicarbonate  650 mg Oral BID  . Vitamin D (Ergocalciferol)  50,000 Units Oral Q7 days   No results found. BMET    Component Value Date/Time   NA 140 01/15/2013 0500   K 4.7 01/15/2013 0500   CL 103 01/15/2013 0500   CO2 25 01/15/2013 0500   GLUCOSE 102* 01/15/2013 0500   BUN 98* 01/15/2013 0500   CREATININE 6.10* 01/15/2013 0500   CALCIUM 9.3 01/15/2013 0500   GFRNONAA 9* 01/15/2013 0500   GFRAA 11* 01/15/2013 0500   CBC    Component Value Date/Time   WBC 7.6 01/14/2013 0456   RBC 4.03* 01/14/2013 0456   HGB 11.4* 01/14/2013 0456   HCT 34.7* 01/14/2013 0456   PLT 419* 01/14/2013 0456   MCV 86.1 01/14/2013 0456   MCH 28.3 01/14/2013 0456   MCHC 32.9 01/14/2013 0456   RDW 17.0* 01/14/2013 0456   LYMPHSABS 1.0 01/01/2013 1859   MONOABS 0.7 01/01/2013 1859   EOSABS 0.0 01/01/2013 1859   BASOSABS 0.0 01/01/2013 1859     Assessment:  1. 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  Scr down to 6.8 2. Lt renal mass, will need nephrectomy 3. PNA 4. HTN 5. Mild hyperkalemia, improved 6. Sec HPTH on  calcitriol  Plan: 1. I had a long discussion with him, again, regarding the inevitability of HD in the future especially in light of the fact that he will need a nephrectomy.  I discussed the risks of long term catheter use.  He is still unwilling to make any decisions.  If renal function cont to  Improves then maybe DC over the weekend?  He will then need a permcath placed before his nephrectomy  2.  Will ask dietitian to see for education Cressie Betzler T

## 2013-01-16 DIAGNOSIS — M109 Gout, unspecified: Secondary | ICD-10-CM

## 2013-01-16 LAB — URIC ACID: Uric Acid, Serum: 12.8 mg/dL — ABNORMAL HIGH (ref 4.0–7.8)

## 2013-01-16 LAB — BASIC METABOLIC PANEL
CO2: 26 mEq/L (ref 19–32)
Chloride: 100 mEq/L (ref 96–112)
Potassium: 5.2 mEq/L — ABNORMAL HIGH (ref 3.5–5.1)
Sodium: 137 mEq/L (ref 135–145)

## 2013-01-16 MED ORDER — PREDNISONE 10 MG PO TABS
10.0000 mg | ORAL_TABLET | Freq: Every day | ORAL | Status: DC
Start: 2013-01-16 — End: 2013-01-17
  Administered 2013-01-16 – 2013-01-17 (×2): 10 mg via ORAL
  Filled 2013-01-16 (×3): qty 1

## 2013-01-16 NOTE — Progress Notes (Signed)
S: CO pain both feet due to gout O:BP 141/83  Pulse 86  Temp(Src) 98.7 F (37.1 C) (Oral)  Resp 20  Ht 6\' 3"  (1.905 m)  Wt 98.385 kg (216 lb 14.4 oz)  BMI 27.11 kg/m2  SpO2 99%  Intake/Output Summary (Last 24 hours) at 01/16/13 0939 Last data filed at 01/16/13 0900  Gross per 24 hour  Intake    870 ml  Output   1450 ml  Net   -580 ml   Weight change: 2.495 kg (5 lb 8 oz) Gen: awake and alert CVS:RRR Resp: bronchial BS Lt base Abd:+ BS NT ND Ext:no edema.  Tender in both feet.  No erythema NEURO:OX3, M&SI, no asterixis   . antiseptic oral rinse  15 mL Mouth Rinse BID  . calcitRIOL  0.25 mcg Oral Daily  . calcium acetate  1,334 mg Oral TID WC  . cloNIDine  0.2 mg Oral TID  . feeding supplement (RESOURCE BREEZE)  1 Container Oral TID BM  . heparin subcutaneous  5,000 Units Subcutaneous Q8H  . hydrALAZINE  50 mg Oral TID  . isosorbide dinitrate  5 mg Oral BID  . levofloxacin  500 mg Oral Q48H  . sodium bicarbonate  650 mg Oral BID  . Vitamin D (Ergocalciferol)  50,000 Units Oral Q7 days   No results found. BMET    Component Value Date/Time   NA 140 01/15/2013 0500   K 4.7 01/15/2013 0500   CL 103 01/15/2013 0500   CO2 25 01/15/2013 0500   GLUCOSE 102* 01/15/2013 0500   BUN 98* 01/15/2013 0500   CREATININE 6.10* 01/15/2013 0500   CALCIUM 9.3 01/15/2013 0500   GFRNONAA 9* 01/15/2013 0500   GFRAA 11* 01/15/2013 0500   CBC    Component Value Date/Time   WBC 7.6 01/14/2013 0456   RBC 4.03* 01/14/2013 0456   HGB 11.4* 01/14/2013 0456   HCT 34.7* 01/14/2013 0456   PLT 419* 01/14/2013 0456   MCV 86.1 01/14/2013 0456   MCH 28.3 01/14/2013 0456   MCHC 32.9 01/14/2013 0456   RDW 17.0* 01/14/2013 0456   LYMPHSABS 1.0 01/01/2013 1859   MONOABS 0.7 01/01/2013 1859   EOSABS 0.0 01/01/2013 1859   BASOSABS 0.0 01/01/2013 1859     Assessment:  1. 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  Labs pending 2. Lt renal mass, will need nephrectomy 3. PNA 4. HTN 5. Mild hyperkalemia, improved 6.  Sec HPTH on calcitriol  Plan: 1. He usually takes prednisone for gout flares so will give low dose.  Says colchicine does not work 2 Check UA level 3.  He now says he will get access placed so will see if [facility] can schedule for Mon      Brian Lawson

## 2013-01-16 NOTE — Progress Notes (Signed)
[facility] PROGRESS NOT   Assessment/Plan:.  Acute hypoxic respiratory failure due to Legionella PNA  - Significantly improved, will discontinue Levaquin. 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  - Dr. Jeffie Pollock with Urology recommended outpt f/u with resection when more stable medically. Will require left nephrectomy.  Acute renal failure -Patient now amenable to undergoing AV fistula placement. Will see a vascular surgery can perform this procedure on Monday morning. Pending lab work at time of this dictation. Yesterday his creatinine came down to 6.1 from 6.88  Acute gout - Patient complaining of ankle pain which he attributes to gout flareup. He states current presentation similar to previous episodes of acute gout. He was started on oral steroids with prednisone 10 mg daily.  Malignant HYPERTENSION  -Blood pressures are stable, with systolic blood pressures fluctuate between 130s to 140s.    Code Status: full Family Communication: Plan discussed with patient's family members present at bedside Disposition Plan: Patient now amenable to undergoing AV fistula, will keep him over weekend, hopefully he can have procedure done on Monday morning..   Consultants:  Urology  Nephrology  neurology  Procedures:  EEG  Antibiotics: Vanc/ Zosyn 10/25 >>> 10/26  Levaquin 10/27 >>>  Zithromax 10/25 >>> 10/27  Rocephin 10/25 >>> 10/26   HPI/Subjective: Patient complaining of bilateral ankle pain which she attributes to gout. He states current presentation similar to prior episodes of gouty flareup. Otherwise this morning he is now amenable to undergo an AV fistula placement. Objective: Filed Vitals:   01/15/13 2159 01/16/13 0218 01/16/13 0524 01/16/13 0932  BP: 142/80  128/81 141/83  Pulse: 89  86 86  Temp: 100.5 F (38.1 C) 99.6 F (37.6 C) 99.7 F (37.6 C) 98.7 F (37.1  C)  TempSrc: Oral Oral Oral Oral  Resp: 20  18 20   Height: 6\' 3"  (1.905 m)     Weight: 98.385 kg (216 lb 14.4 oz)     SpO2: 97%  96% 99%    Intake/Output Summary (Last 24 hours) at 01/16/13 1108 Last data filed at 01/16/13 0900  Gross per 24 hour  Intake    870 ml  Output   1450 ml  Net   -580 ml   Filed Weights   01/13/13 2127 01/14/13 2233 01/15/13 2159  Weight: 101.016 kg (222 lb 11.2 oz) 95.89 kg (211 lb 6.4 oz) 98.385 kg (216 lb 14.4 oz)    Exam:  General: Alert, awake, oriented x3, in no acute distress.  HEENT: No bruits, no goiter.  Heart: Regular rate and rhythm, without murmurs, rubs, gallops.  Lungs: Good air movement, clear to asucultation Abdomen: Soft, nontender, nondistended, positive bowel sounds.  Neuro: Grossly intact, nonfocal. Extremities: Patient reporting pain to palpation over bilateral ankles.   Data Reviewed: Basic Metabolic Panel:  Recent Labs Lab 01/10/13 0500 01/11/13 0500 01/12/13 0850 01/13/13 0504 01/14/13 0456 01/15/13 0500  NA 138 137 138 141 141 140  K 3.8 4.1 4.5 4.6 5.3* 4.7  CL 96 97 98 102 102 103  CO2 25 26 25 25 26 25   GLUCOSE 105* 118* 156* 187* 109* 102*  BUN 115* 116* 112* 112* 106* 98*  CREATININE 8.62* 8.40* 7.98* 7.45* 6.88* 6.10*  CALCIUM 9.1 9.0 9.2 9.3 9.5 9.3  PHOS 8.5* 7.7* 7.6*  --   --  7.0*   Liver Function Tests:  Recent Labs Lab 01/10/13 0500 01/11/13 0500  01/12/13 0850 01/15/13 0500  ALBUMIN 2.6* 2.4* 2.6* 2.5*   No results found for this basename: LIPASE, AMYLASE,  in the last 168 hours No results found for this basename: AMMONIA,  in the last 168 hours CBC:  Recent Labs Lab 01/10/13 0500 01/14/13 0456  WBC 9.7 7.6  HGB 12.5* 11.4*  HCT 36.0* 34.7*  MCV 82.2 86.1  PLT 433* 419*   Cardiac Enzymes: No results found for this basename: CKTOTAL, CKMB, CKMBINDEX, TROPONINI,  in the last 168 hours BNP (last 3 results) No results found for this basename: PROBNP,  in the last 8760  hours CBG: No results found for this basename: GLUCAP,  in the last 168 hours  No results found for this or any previous visit (from the past 240 hour(s)).   Studies: No results found.  Scheduled Meds: . antiseptic oral rinse  15 mL Mouth Rinse BID  . calcitRIOL  0.25 mcg Oral Daily  . calcium acetate  1,334 mg Oral TID WC  . cloNIDine  0.2 mg Oral TID  . feeding supplement (RESOURCE BREEZE)  1 Container Oral TID BM  . heparin subcutaneous  5,000 Units Subcutaneous Q8H  . hydrALAZINE  50 mg Oral TID  . isosorbide dinitrate  5 mg Oral BID  . predniSONE  10 mg Oral Q breakfast  . sodium bicarbonate  650 mg Oral BID  . Vitamin D (Ergocalciferol)  50,000 Units Oral Q7 days   Continuous Infusions: . sodium chloride 20 mL/hr at 01/08/13 1900     Kelvin Cellar  [facility] Pager [phone]. If 8PM-8AM, please contact night-coverage at [email or web address], password [facility] 01/16/2013, 11:08 AM  LOS: 15 days

## 2013-01-17 LAB — BASIC METABOLIC PANEL
CO2: 26 mEq/L (ref 19–32)
Chloride: 100 mEq/L (ref 96–112)
GFR calc non Af Amer: 11 mL/min — ABNORMAL LOW (ref >90)
Glucose, Bld: 114 mg/dL — ABNORMAL HIGH (ref 70–99)
Sodium: 137 mEq/L (ref 135–145)

## 2013-01-17 MED ORDER — PREDNISONE 10 MG PO TABS
10.0000 mg | ORAL_TABLET | Freq: Two times a day (BID) | ORAL | Status: DC
  Filled 2013-01-17 (×2): qty 1

## 2013-01-17 MED ORDER — PREDNISONE 20 MG PO TABS: 30.0000 mg | ORAL_TABLET | Freq: Every day | ORAL | Status: DC

## 2013-01-17 MED ORDER — DEXTROSE 5 % IV SOLN
1.5000 g | INTRAVENOUS | Status: AC
  Administered 2013-01-18: 1.5 g via INTRAVENOUS
  Filled 2013-01-17: qty 1.5

## 2013-01-17 MED ORDER — PREDNISONE 20 MG PO TABS
40.0000 mg | ORAL_TABLET | Freq: Every day | ORAL | Status: DC
  Administered 2013-01-17 – 2013-01-25 (×9): 40 mg via ORAL
  Filled 2013-01-17 (×14): qty 2

## 2013-01-17 NOTE — Progress Notes (Signed)
S:  predisone helped with gout pain but asking if can take it twice a day. Narcotics don't help O:BP 154/83  Pulse 74  Temp(Src) 98.3 F (36.8 C) (Oral)  Resp 19  Ht 6\' 3"  (1.905 m)  Wt 95.754 kg (211 lb 1.6 oz)  BMI 26.39 kg/m2  SpO2 98%  Intake/Output Summary (Last 24 hours) at 01/17/13 1009 Last data filed at 01/17/13 0837  Gross per 24 hour  Intake    530 ml  Output   2050 ml  Net  -1520 ml   Weight change: -2.631 kg (-5 lb 12.8 oz) Gen: awake and alert CVS:RRR Resp: bronchial BS Lt base Abd:+ BS NT ND Ext:no edema.  Tender in both feet.  No erythema NEURO:OX3, M&SI, no asterixis   . antiseptic oral rinse  15 mL Mouth Rinse BID  . calcitRIOL  0.25 mcg Oral Daily  . calcium acetate  1,334 mg Oral TID WC  . [START ON 01/18/2013] cefUROXime (ZINACEF)  IV  1.5 g Intravenous On Call to OR  . cloNIDine  0.2 mg Oral TID  . feeding supplement (RESOURCE BREEZE)  1 Container Oral TID BM  . heparin subcutaneous  5,000 Units Subcutaneous Q8H  . hydrALAZINE  50 mg Oral TID  . isosorbide dinitrate  5 mg Oral BID  . predniSONE  10 mg Oral Q breakfast  . sodium bicarbonate  650 mg Oral BID  . Vitamin D (Ergocalciferol)  50,000 Units Oral Q7 days   No results found. BMET    Component Value Date/Time   NA 137 01/17/2013 0500   K 5.0 01/17/2013 0500   CL 100 01/17/2013 0500   CO2 26 01/17/2013 0500   GLUCOSE 114* 01/17/2013 0500   BUN 87* 01/17/2013 0500   CREATININE 5.32* 01/17/2013 0500   CALCIUM 9.4 01/17/2013 0500   GFRNONAA 11* 01/17/2013 0500   GFRAA 12* 01/17/2013 0500   CBC    Component Value Date/Time   WBC 7.6 01/14/2013 0456   RBC 4.03* 01/14/2013 0456   HGB 11.4* 01/14/2013 0456   HCT 34.7* 01/14/2013 0456   PLT 419* 01/14/2013 0456   MCV 86.1 01/14/2013 0456   MCH 28.3 01/14/2013 0456   MCHC 32.9 01/14/2013 0456   RDW 17.0* 01/14/2013 0456   LYMPHSABS 1.0 01/01/2013 1859   MONOABS 0.7 01/01/2013 1859   EOSABS 0.0 01/01/2013 1859   BASOSABS 0.0 01/01/2013 1859      Assessment:  1. 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  Scr trending down  2. Lt renal mass, will need nephrectomy 3. PNA 4. HTN 5. Mild hyperkalemia, improved 6. Sec HPTH on calcitriol  Plan: 1. Will give prednisone BID for 2 days 2. For access placement in AM 3. Recheck labs in AM      Ricard Faulkner T

## 2013-01-17 NOTE — Progress Notes (Signed)
[facility] PROGRESS NOT   Assessment/Plan:.  Acute hypoxic respiratory failure due to Legionella PNA  - Significantly improved, will discontinue Levaquin. 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  - Dr. Jeffie Pollock with Urology recommended outpt f/u with resection when more stable medically. Will require left nephrectomy.  Acute renal failure -Patient now amenable to undergoing AV fistula placement. Will see a vascular surgery can perform this procedure on Monday morning. Pending lab work at time of this dictation.   Acute gout - States ongoing severe pain and usually take 30mg  of prednisone for acute attacks. Will increase his steriod dose, monitor response  Malignant HYPERTENSION  -Blood pressures are stable, with systolic blood pressures fluctuate between 130s to 140s.    Code Status: full Family Communication: Plan discussed with patient's family members present at bedside Disposition Plan: Patient now amenable to undergoing AV fistula, will keep him over weekend, hopefully he can have procedure done on Monday morning..   Consultants:  Urology  Nephrology  neurology  Procedures:  EEG  Antibiotics: Vanc/ Zosyn 10/25 >>> 10/26  Levaquin 10/27 >>>  Zithromax 10/25 >>> 10/27  Rocephin 10/25 >>> 10/26   HPI/Subjective: Patient complaining of bilateral ankle pain, stating he takes 30 mg of prednisone for acute attacks. Otherwise this morning he is now amenable to undergo an AV fistula placement. Objective: Filed Vitals:   01/16/13 1900 01/16/13 2201 01/17/13 0523 01/17/13 0945  BP: 141/77 135/74 154/83 147/86  Pulse: 85 80 74 80  Temp: 98.5 F (36.9 C) 98.7 F (37.1 C) 98.3 F (36.8 C) 100.2 F (37.9 C)  TempSrc: Oral Oral Oral Oral  Resp: 20 19 19 18   Height:  6\' 3"  (1.905 m)    Weight:  95.754 kg (211 lb 1.6 oz)    SpO2: 100% 96% 98% 96%    Intake/Output  Summary (Last 24 hours) at 01/17/13 1353 Last data filed at 01/17/13 0837  Gross per 24 hour  Intake    510 ml  Output   2050 ml  Net  -1540 ml   Filed Weights   01/14/13 2233 01/15/13 2159 01/16/13 2201  Weight: 95.89 kg (211 lb 6.4 oz) 98.385 kg (216 lb 14.4 oz) 95.754 kg (211 lb 1.6 oz)    Exam:  General: Alert, awake, oriented x3, in no acute distress.  HEENT: No bruits, no goiter.  Heart: Regular rate and rhythm, without murmurs, rubs, gallops.  Lungs: Good air movement, clear to asucultation Abdomen: Soft, nontender, nondistended, positive bowel sounds.  Neuro: Grossly intact, nonfocal. Extremities: Patient reporting pain to palpation over bilateral ankles.   Data Reviewed: Basic Metabolic Panel:  Recent Labs Lab 01/11/13 0500 01/12/13 0850 01/13/13 0504 01/14/13 0456 01/15/13 0500 01/16/13 1220 01/17/13 0500  NA 137 138 141 141 140 137 137  K 4.1 4.5 4.6 5.3* 4.7 5.2* 5.0  CL 97 98 102 102 103 100 100  CO2 26 25 25 26 25 26 26   GLUCOSE 118* 156* 187* 109* 102* 126* 114*  BUN 116* 112* 112* 106* 98* 91* 87*  CREATININE 8.40* 7.98* 7.45* 6.88* 6.10* 5.58* 5.32*  CALCIUM 9.0 9.2 9.3 9.5 9.3 9.3 9.4  PHOS 7.7* 7.6*  --   --  7.0*  --   --    Liver Function Tests:  Recent Labs Lab 01/11/13 0500 01/12/13 0850 01/15/13 0500  ALBUMIN 2.4* 2.6* 2.5*   No results found for  this basename: LIPASE, AMYLASE,  in the last 168 hours No results found for this basename: AMMONIA,  in the last 168 hours CBC:  Recent Labs Lab 01/14/13 0456  WBC 7.6  HGB 11.4*  HCT 34.7*  MCV 86.1  PLT 419*   Cardiac Enzymes: No results found for this basename: CKTOTAL, CKMB, CKMBINDEX, TROPONINI,  in the last 168 hours BNP (last 3 results) No results found for this basename: PROBNP,  in the last 8760 hours CBG: No results found for this basename: GLUCAP,  in the last 168 hours  No results found for this or any previous visit (from the past 240 hour(s)).   Studies: No  results found.  Scheduled Meds: . antiseptic oral rinse  15 mL Mouth Rinse BID  . calcitRIOL  0.25 mcg Oral Daily  . calcium acetate  1,334 mg Oral TID WC  . [START ON 01/18/2013] cefUROXime (ZINACEF)  IV  1.5 g Intravenous On Call to OR  . cloNIDine  0.2 mg Oral TID  . feeding supplement (RESOURCE BREEZE)  1 Container Oral TID BM  . heparin subcutaneous  5,000 Units Subcutaneous Q8H  . hydrALAZINE  50 mg Oral TID  . isosorbide dinitrate  5 mg Oral BID  . predniSONE  30 mg Oral Q breakfast  . [START ON 01/18/2013] predniSONE  40 mg Oral Q breakfast  . sodium bicarbonate  650 mg Oral BID  . Vitamin D (Ergocalciferol)  50,000 Units Oral Q7 days   Continuous Infusions: . sodium chloride 20 mL/hr at 01/08/13 1900     Kelvin Cellar  [facility] Pager [phone]. If 8PM-8AM, please contact night-coverage at [email or web address], password [facility] 01/17/2013, 1:53 PM  LOS: 16 days

## 2013-01-18 ENCOUNTER — Encounter (HOSPITAL_COMMUNITY)
Admission: EM | Disposition: A | Discharge status: Skilled Nursing Facility | Payer: Self-pay | Source: Home / Self Care | Attending: Internal Medicine

## 2013-01-18 ENCOUNTER — Encounter (HOSPITAL_COMMUNITY): Payer: Medicaid Other | Admitting: Anesthesiology

## 2013-01-18 ENCOUNTER — Inpatient Hospital Stay (HOSPITAL_COMMUNITY): Payer: Medicaid Other | Admitting: Anesthesiology

## 2013-01-18 ENCOUNTER — Encounter (HOSPITAL_COMMUNITY): Payer: Self-pay | Admitting: Certified Registered"

## 2013-01-18 DIAGNOSIS — N185 Chronic kidney disease, stage 5: Secondary | ICD-10-CM

## 2013-01-18 HISTORY — PX: BASCILIC VEIN TRANSPOSITION: SHX5742

## 2013-01-18 LAB — SURGICAL PCR SCREEN
MRSA, PCR: NEGATIVE
Staphylococcus aureus: NEGATIVE

## 2013-01-18 LAB — BASIC METABOLIC PANEL
CO2: 23 mEq/L (ref 19–32)
Creatinine, Ser: 4.74 mg/dL — ABNORMAL HIGH (ref 0.50–1.35)
GFR calc non Af Amer: 12 mL/min — ABNORMAL LOW (ref >90)
Glucose, Bld: 141 mg/dL — ABNORMAL HIGH (ref 70–99)
Potassium: 5.6 mEq/L — ABNORMAL HIGH (ref 3.5–5.1)
Sodium: 134 mEq/L — ABNORMAL LOW (ref 135–145)

## 2013-01-18 SURGERY — TRANSPOSITION, VEIN, BASILIC
Anesthesia: General | Site: Arm Lower | Laterality: Right | Wound class: Clean

## 2013-01-18 MED ORDER — HEPARIN SODIUM (PORCINE) 1000 UNIT/ML IJ SOLN
INTRAMUSCULAR | Status: DC | PRN
  Administered 2013-01-18: 5000 [IU] via INTRAVENOUS

## 2013-01-18 MED ORDER — MEPERIDINE HCL 25 MG/ML IJ SOLN: 6.2500 mg | INTRAMUSCULAR | Status: DC | PRN

## 2013-01-18 MED ORDER — FENTANYL CITRATE 0.05 MG/ML IJ SOLN
INTRAMUSCULAR | Status: DC | PRN
  Administered 2013-01-18: 100 ug via INTRAVENOUS
  Administered 2013-01-18: 25 ug via INTRAVENOUS

## 2013-01-18 MED ORDER — OXYCODONE HCL 5 MG PO TABS
ORAL_TABLET | ORAL | Status: AC
  Filled 2013-01-18: qty 2

## 2013-01-18 MED ORDER — ONDANSETRON HCL 4 MG/2ML IJ SOLN
INTRAMUSCULAR | Status: DC | PRN
  Administered 2013-01-18: 4 mg via INTRAVENOUS

## 2013-01-18 MED ORDER — OXYCODONE HCL 5 MG/5ML PO SOLN: 5.0000 mg | Freq: Once | ORAL | Status: AC | PRN

## 2013-01-18 MED ORDER — HYDROMORPHONE HCL PF 1 MG/ML IJ SOLN
0.2500 mg | INTRAMUSCULAR | Status: DC | PRN
  Administered 2013-01-18 (×2): 0.5 mg via INTRAVENOUS

## 2013-01-18 MED ORDER — LIDOCAINE HCL (PF) 1 % IJ SOLN
INTRAMUSCULAR | Status: DC | PRN
  Administered 2013-01-18: 10 mL

## 2013-01-18 MED ORDER — PROPOFOL 10 MG/ML IV BOLUS
INTRAVENOUS | Status: DC | PRN
  Administered 2013-01-18: 200 mg via INTRAVENOUS

## 2013-01-18 MED ORDER — SODIUM CHLORIDE 0.9 % IR SOLN
Status: DC | PRN
  Administered 2013-01-18: 16:00:00

## 2013-01-18 MED ORDER — ONDANSETRON HCL 4 MG/2ML IJ SOLN: 4.0000 mg | Freq: Once | INTRAMUSCULAR | Status: DC | PRN

## 2013-01-18 MED ORDER — OXYCODONE HCL 5 MG PO TABS
5.0000 mg | ORAL_TABLET | Freq: Once | ORAL | Status: AC | PRN
Start: 2013-01-18 — End: 2013-01-18
  Administered 2013-01-18: 5 mg via ORAL

## 2013-01-18 MED ORDER — 0.9 % SODIUM CHLORIDE (POUR BTL) OPTIME
TOPICAL | Status: DC | PRN
  Administered 2013-01-18: 1000 mL

## 2013-01-18 MED ORDER — LIDOCAINE HCL (PF) 1 % IJ SOLN
INTRAMUSCULAR | Status: AC
  Filled 2013-01-18: qty 30

## 2013-01-18 MED ORDER — HYDROMORPHONE HCL PF 1 MG/ML IJ SOLN
INTRAMUSCULAR | Status: AC
  Filled 2013-01-18: qty 1

## 2013-01-18 MED ORDER — LIDOCAINE HCL (CARDIAC) 20 MG/ML IV SOLN
INTRAVENOUS | Status: DC | PRN
  Administered 2013-01-18: 100 mg via INTRAVENOUS

## 2013-01-18 MED ORDER — SODIUM POLYSTYRENE SULFONATE 15 GM/60ML PO SUSP: 30.0000 g | Freq: Once | ORAL | Status: DC

## 2013-01-18 SURGICAL SUPPLY — 46 items
ADH SKN CLS APL DERMABOND .7 (GAUZE/BANDAGES/DRESSINGS) ×2
ADH SKN CLS LQ APL DERMABOND (GAUZE/BANDAGES/DRESSINGS) ×1
CANISTER SUCTION 2500CC (MISCELLANEOUS) ×2 IMPLANT
CLIP TI MEDIUM 24 (CLIP) ×2 IMPLANT
CLIP TI WIDE RED SMALL 24 (CLIP) ×2 IMPLANT
COVER PROBE W GEL 5X96 (DRAPES) ×2 IMPLANT
COVER SURGICAL LIGHT HANDLE (MISCELLANEOUS) ×2 IMPLANT
DECANTER SPIKE VIAL GLASS SM (MISCELLANEOUS) ×2 IMPLANT
DERMABOND ADHESIVE PROPEN (GAUZE/BANDAGES/DRESSINGS) ×1
DERMABOND ADVANCED (GAUZE/BANDAGES/DRESSINGS) ×2
DERMABOND ADVANCED .7 DNX12 (GAUZE/BANDAGES/DRESSINGS) ×2 IMPLANT
DERMABOND ADVANCED .7 DNX6 (GAUZE/BANDAGES/DRESSINGS) ×1 IMPLANT
DRAIN PENROSE 1/2X12 LTX STRL (WOUND CARE) ×1 IMPLANT
ELECT REM PT RETURN 9FT ADLT (ELECTROSURGICAL) ×2
ELECTRODE REM PT RTRN 9FT ADLT (ELECTROSURGICAL) ×1 IMPLANT
GEL ULTRASOUND 20GR AQUASONIC (MISCELLANEOUS) IMPLANT
GLOVE BIO SURGEON STRL SZ7.5 (GLOVE) ×2 IMPLANT
GLOVE BIOGEL PI IND STRL 6.5 (GLOVE) IMPLANT
GLOVE BIOGEL PI IND STRL 7.0 (GLOVE) ×3 IMPLANT
GLOVE BIOGEL PI INDICATOR 6.5 (GLOVE) ×1
GLOVE BIOGEL PI INDICATOR 7.0 (GLOVE) ×3
GLOVE SS BIOGEL STRL SZ 6.5 (GLOVE) ×1 IMPLANT
GLOVE SUPERSENSE BIOGEL SZ 6.5 (GLOVE) ×1
GLOVE SURG SS PI 7.0 STRL IVOR (GLOVE) ×6 IMPLANT
GOWN PREVENTION PLUS XLARGE (GOWN DISPOSABLE) ×2 IMPLANT
GOWN STRL NON-REIN LRG LVL3 (GOWN DISPOSABLE) ×8 IMPLANT
GOWN STRL REIN XL XLG (GOWN DISPOSABLE) ×4 IMPLANT
KIT BASIN OR (CUSTOM PROCEDURE TRAY) ×2 IMPLANT
KIT ROOM TURNOVER OR (KITS) ×2 IMPLANT
LOOP VESSEL MINI RED (MISCELLANEOUS) IMPLANT
NS IRRIG 1000ML POUR BTL (IV SOLUTION) ×2 IMPLANT
PACK CV ACCESS (CUSTOM PROCEDURE TRAY) ×2 IMPLANT
PAD ARMBOARD 7.5X6 YLW CONV (MISCELLANEOUS) ×4 IMPLANT
SPONGE SURGIFOAM ABS GEL 100 (HEMOSTASIS) IMPLANT
SUT PROLENE 6 0 CC (SUTURE) ×2 IMPLANT
SUT PROLENE 7 0 BV 1 (SUTURE) ×2 IMPLANT
SUT SILK 2 0 SH (SUTURE) ×2 IMPLANT
SUT SILK 3 0 (SUTURE) ×2
SUT SILK 3-0 18XBRD TIE 12 (SUTURE) ×1 IMPLANT
SUT VIC AB 3-0 SH 27 (SUTURE) ×4
SUT VIC AB 3-0 SH 27X BRD (SUTURE) ×1 IMPLANT
SUT VICRYL 4-0 PS2 18IN ABS (SUTURE) ×4 IMPLANT
TOWEL OR 17X24 6PK STRL BLUE (TOWEL DISPOSABLE) ×2 IMPLANT
TOWEL OR 17X26 10 PK STRL BLUE (TOWEL DISPOSABLE) ×2 IMPLANT
UNDERPAD 30X30 INCONTINENT (UNDERPADS AND DIAPERS) ×2 IMPLANT
WATER STERILE IRR 1000ML POUR (IV SOLUTION) ×2 IMPLANT

## 2013-01-18 NOTE — Progress Notes (Signed)
[facility] PROGRESS NOT   Assessment/Plan:.  Acute hypoxic respiratory failure due to Legionella PNA  - Significantly improved, will discontinue Levaquin. 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  - Dr. Jeffie Pollock with Urology recommended outpt f/u with resection when more stable medically. Will require left nephrectomy.  Acute renal failure -Plan for AV fistula placement today. Patient is currently n.p.o..   Acute gout - Continue oral prednisone 40 mg by mouth daily, patient reports feeling better.  Malignant HYPERTENSION  -Blood pressures are stable, with systolic blood pressures fluctuate between 130s to 140s.    Code Status: full Family Communication: Plan discussed with patient's family members present at bedside Disposition Plan: Patient now amenable to undergoing AV fistula, will keep him over weekend, hopefully he can have procedure done on Monday morning..   Consultants:  Urology  Nephrology  neurology  Procedures:  EEG  Antibiotics: Vanc/ Zosyn 10/25 >>> 10/26  Levaquin 10/27 >>>  Zithromax 10/25 >>> 10/27  Rocephin 10/25 >>> 10/26   HPI/Subjective: Plan for AV fistula today. Patient currently n.p.o. He states improvement to gout. Otherwise has no complaints.  Objective: Filed Vitals:   01/17/13 1716 01/17/13 1844 01/17/13 2200 01/18/13 0437  BP: 131/82 135/86 153/86 154/88  Pulse:  93 88 70  Temp:  99.3 F (37.4 C) 99.7 F (37.6 C) 98 F (36.7 C)  TempSrc:  Oral Oral Oral  Resp:  18 16 18   Height:      Weight:      SpO2:  97% 97% 97%    Intake/Output Summary (Last 24 hours) at 01/18/13 1339 Last data filed at 01/18/13 0649  Gross per 24 hour  Intake    360 ml  Output   1775 ml  Net  -1415 ml   Filed Weights   01/14/13 2233 01/15/13 2159 01/16/13 2201  Weight: 95.89 kg (211 lb 6.4 oz) 98.385 kg (216 lb 14.4 oz) 95.754 kg (211 lb 1.6  oz)    Exam:  General: Alert, awake, oriented x3, in no acute distress.  HEENT: No bruits, no goiter.  Heart: Regular rate and rhythm, without murmurs, rubs, gallops.  Lungs: Good air movement, clear to asucultation Abdomen: Soft, nontender, nondistended, positive bowel sounds.  Neuro: Grossly intact, nonfocal. Extremities: Patient reporting pain to palpation over bilateral ankles.   Data Reviewed: Basic Metabolic Panel:  Recent Labs Lab 01/12/13 0850  01/14/13 0456 01/15/13 0500 01/16/13 1220 01/17/13 0500 01/18/13 0443  NA 138  < > 141 140 137 137 134*  K 4.5  < > 5.3* 4.7 5.2* 5.0 5.6*  CL 98  < > 102 103 100 100 99  CO2 25  < > 26 25 26 26 23   GLUCOSE 156*  < > 109* 102* 126* 114* 141*  BUN 112*  < > 106* 98* 91* 87* 90*  CREATININE 7.98*  < > 6.88* 6.10* 5.58* 5.32* 4.74*  CALCIUM 9.2  < > 9.5 9.3 9.3 9.4 9.6  PHOS 7.6*  --   --  7.0*  --   --   --   < > = values in this interval not displayed. Liver Function Tests:  Recent Labs Lab 01/12/13 0850 01/15/13 0500  ALBUMIN 2.6* 2.5*   No results found for this basename: LIPASE, AMYLASE,  in the last 168 hours No results found for this basename: AMMONIA,  in the last 168 hours CBC:  Recent  Labs Lab 01/14/13 0456  WBC 7.6  HGB 11.4*  HCT 34.7*  MCV 86.1  PLT 419*   Cardiac Enzymes: No results found for this basename: CKTOTAL, CKMB, CKMBINDEX, TROPONINI,  in the last 168 hours BNP (last 3 results) No results found for this basename: PROBNP,  in the last 8760 hours CBG: No results found for this basename: GLUCAP,  in the last 168 hours  Recent Results (from the past 240 hour(s))  SURGICAL PCR SCREEN     Status: None   Collection Time    01/18/13 12:18 AM      Result Value Range Status   MRSA, PCR NEGATIVE  NEGATIVE Final   Staphylococcus aureus NEGATIVE  NEGATIVE Final   Comment:            The Xpert SA Assay (FDA     approved for NASAL specimens     in patients over 63 years of age),     is one  component of     a comprehensive surveillance     program.  Test performance has     been validated by [employer] for patients greater     than or equal to 30 year old.     It is not intended     to diagnose infection nor to     guide or monitor treatment.     Studies: No results found.  Scheduled Meds: . antiseptic oral rinse  15 mL Mouth Rinse BID  . calcitRIOL  0.25 mcg Oral Daily  . calcium acetate  1,334 mg Oral TID WC  . cefUROXime (ZINACEF)  IV  1.5 g Intravenous On Call to OR  . cloNIDine  0.2 mg Oral TID  . feeding supplement (RESOURCE BREEZE)  1 Container Oral TID BM  . heparin subcutaneous  5,000 Units Subcutaneous Q8H  . hydrALAZINE  50 mg Oral TID  . isosorbide dinitrate  5 mg Oral BID  . predniSONE  40 mg Oral Q breakfast  . sodium bicarbonate  650 mg Oral BID  . sodium polystyrene  30 g Oral Once  . Vitamin D (Ergocalciferol)  50,000 Units Oral Q7 days   Continuous Infusions: . sodium chloride 20 mL/hr at 01/08/13 1900     Kelvin Cellar  [facility] Pager [phone]. If 8PM-8AM, please contact night-coverage at [email or web address], password [facility] 01/18/2013, 1:39 PM  LOS: 17 days

## 2013-01-18 NOTE — Anesthesia Preprocedure Evaluation (Signed)
Anesthesia Evaluation  Patient identified by MRN, date of birth, ID band Patient awake    Reviewed: Allergy & Precautions, H&P , NPO status , Patient's Chart, lab work & pertinent test results  Airway Mallampati: I TM Distance: >3 FB     Dental   Pulmonary former smoker,          Cardiovascular hypertension, Pt. on medications     Neuro/Psych    GI/Hepatic   Endo/Other    Renal/GU Dialysis and CRFRenal disease     Musculoskeletal   Abdominal   Peds  Hematology   Anesthesia Other Findings   Reproductive/Obstetrics                           Anesthesia Physical Anesthesia Plan  ASA: III  Anesthesia Plan: General   Post-op Pain Management:    Induction: Intravenous  Airway Management Planned: LMA  Additional Equipment:   Intra-op Plan:   Post-operative Plan: Extubation in OR  Informed Consent: I have reviewed the patients History and Physical, chart, labs and discussed the procedure including the risks, benefits and alternatives for the proposed anesthesia with the patient or authorized representative who has indicated his/her understanding and acceptance.     Plan Discussed with: CRNA and Surgeon  Anesthesia Plan Comments:         Anesthesia Quick Evaluation

## 2013-01-18 NOTE — Anesthesia Postprocedure Evaluation (Signed)
  Anesthesia Post-op Note  Patient: Brian Lawson  Procedure(s) Performed: Procedure(s): BASCILIC VEIN TRANSPOSITION (Right)  Patient Location: PACU  Anesthesia Type:General  Level of Consciousness: awake, alert  and oriented  Airway and Oxygen Therapy: Patient Spontanous Breathing and Patient connected to nasal cannula oxygen  Post-op Pain: mild  Post-op Assessment: Post-op Vital signs reviewed  Post-op Vital Signs: Reviewed  Complications: No apparent anesthesia complications

## 2013-01-18 NOTE — Progress Notes (Signed)
[facility] ROUNDING NOTE   Subjective:   Interval History:no complaints Fistula planned today Objective:  Vital signs in last 24 hours:  Temp:  [98 F (36.7 C)-99.7 F (37.6 C)] 98 F (36.7 C) (11/10 0437) Pulse Rate:  [70-93] 70 (11/10 0437) Resp:  [16-18] 18 (11/10 0437) BP: (131-154)/(82-88) 154/88 mmHg (11/10 0437) SpO2:  [97 %] 97 % (11/10 0437)  Weight change:  Filed Weights   01/14/13 2233 01/15/13 2159 01/16/13 2201  Weight: 95.89 kg (211 lb 6.4 oz) 98.385 kg (216 lb 14.4 oz) 95.754 kg (211 lb 1.6 oz)    Intake/Output: I/O last 3 completed shifts: In: 600 [P.O.:600] Out: 3125 [Urine:3125]   Intake/Output this shift:     CVS- RRR RS- CTA ABD- BS present soft non-distended EXT- no edema   Basic Metabolic Panel:  Recent Labs Lab 01/12/13 0850  01/14/13 0456 01/15/13 0500 01/16/13 1220 01/17/13 0500 01/18/13 0443  NA 138  < > 141 140 137 137 134*  K 4.5  < > 5.3* 4.7 5.2* 5.0 5.6*  CL 98  < > 102 103 100 100 99  CO2 25  < > 26 25 26 26 23   GLUCOSE 156*  < > 109* 102* 126* 114* 141*  BUN 112*  < > 106* 98* 91* 87* 90*  CREATININE 7.98*  < > 6.88* 6.10* 5.58* 5.32* 4.74*  CALCIUM 9.2  < > 9.5 9.3 9.3 9.4 9.6  PHOS 7.6*  --   --  7.0*  --   --   --   < > = values in this interval not displayed.  Liver Function Tests:  Recent Labs Lab 01/12/13 0850 01/15/13 0500  ALBUMIN 2.6* 2.5*   No results found for this basename: LIPASE, AMYLASE,  in the last 168 hours No results found for this basename: AMMONIA,  in the last 168 hours  CBC:  Recent Labs Lab 01/14/13 0456  WBC 7.6  HGB 11.4*  HCT 34.7*  MCV 86.1  PLT 419*    Cardiac Enzymes: No results found for this basename: CKTOTAL, CKMB, CKMBINDEX, TROPONINI,  in the last 168 hours  BNP: No components found with this basename: POCBNP,   CBG: No results found for this basename: GLUCAP,  in the last 168 hours  Microbiology: Results for orders placed during the hospital  encounter of 01/01/13  CULTURE, BLOOD (ROUTINE X 2)     Status: None   Collection Time    01/01/13  8:30 PM      Result Value Range Status   Specimen Description BLOOD RIGHT ARM   Final   Special Requests BOTTLES DRAWN AEROBIC AND ANAEROBIC 10CC   Final   Culture  Setup Time     Final   Value: 01/02/2013 01:13     Performed at [place]     Final   Value: NO GROWTH 5 DAYS     Performed at [facility]   Report Status 01/08/2013 FINAL   Final  CULTURE, BLOOD (ROUTINE X 2)     Status: None   Collection Time    01/01/13  8:45 PM      Result Value Range Status   Specimen Description BLOOD RIGHT HAND   Final   Special Requests BOTTLES DRAWN AEROBIC AND ANAEROBIC 10CC   Final   Culture  Setup Time     Final   Value: 01/02/2013 01:41     Performed at [facility]  Final   Value: NO GROWTH 5 DAYS     Performed at [facility]   Report Status 01/08/2013 FINAL   Final  MRSA PCR SCREENING     Status: None   Collection Time    01/01/13 10:25 PM      Result Value Range Status   MRSA by PCR NEGATIVE  NEGATIVE Final   Comment:            The GeneXpert MRSA Assay (FDA     approved for NASAL specimens     only), is one component of a     comprehensive MRSA colonization     surveillance program. It is not     intended to diagnose MRSA     infection nor to guide or     monitor treatment for     MRSA infections.  SURGICAL PCR SCREEN     Status: None   Collection Time    01/18/13 12:18 AM      Result Value Range Status   MRSA, PCR NEGATIVE  NEGATIVE Final   Staphylococcus aureus NEGATIVE  NEGATIVE Final   Comment:            The Xpert SA Assay (FDA     approved for NASAL specimens     in patients over 46 years of age),     is one component of     a comprehensive surveillance     program.  Test performance has     been validated by [employer] for patients greater     than or equal to 6 year old.     It is not intended      to diagnose infection nor to     guide or monitor treatment.    Coagulation Studies: No results found for this basename: LABPROT, INR,  in the last 72 hours  Urinalysis: No results found for this basename: COLORURINE, APPERANCEUR, LABSPEC, PHURINE, GLUCOSEU, HGBUR, BILIRUBINUR, KETONESUR, PROTEINUR, UROBILINOGEN, NITRITE, LEUKOCYTESUR,  in the last 72 hours    Imaging: No results found.   Medications:   . sodium chloride 20 mL/hr at 01/08/13 1900   . antiseptic oral rinse  15 mL Mouth Rinse BID  . calcitRIOL  0.25 mcg Oral Daily  . calcium acetate  1,334 mg Oral TID WC  . cefUROXime (ZINACEF)  IV  1.5 g Intravenous On Call to OR  . cloNIDine  0.2 mg Oral TID  . feeding supplement (RESOURCE BREEZE)  1 Container Oral TID BM  . heparin subcutaneous  5,000 Units Subcutaneous Q8H  . hydrALAZINE  50 mg Oral TID  . isosorbide dinitrate  5 mg Oral BID  . predniSONE  40 mg Oral Q breakfast  . sodium bicarbonate  650 mg Oral BID  . Vitamin D (Ergocalciferol)  50,000 Units Oral Q7 days   acetaminophen, acetaminophen, alum & mag hydroxide-simeth, HYDROmorphone (DILAUDID) injection, influenza vac split quadrivalent PF, labetalol, ondansetron (ZOFRAN) IV, ondansetron, oxyCODONE, pneumococcal 23 valent vaccine, sodium chloride, white petrolatum  Assessment/ Plan:   This is a 57 y.o. male who presented to the ED a little over a week ago after being found unresponsive in his home. He states that he was not unconscious, just "off". He was brought to the hospital by EMS and was oriented at arrival. He has hx of fevers, chills and diarrhea for about a week. CXR revealed LLL PNA. His creatinine has continued to rise. He did have a urine antigen  for legionella was positive and is on Levaquin for PNA. He underwent a renal ultrasound, which revealed a large mass on the left kidney and a urology consult is obtained.  1. CKD 4/5 for fistula placement today  2. Renal cell Cancer follow up with  urology for nephrectomy  3. Hyperkalemia  Kayexalate and low potassium diat  4. Follow up at [facility]   LOS: 17 Dearies Meikle W @TODAY @10 :40 AM

## 2013-01-18 NOTE — Progress Notes (Signed)
Physical Therapy Treatment Patient Details Name: Brian Lawson MRN: [id number] DOB: 09-30-1955 Today's Date: 01/18/2013 Time: [id number] PT Time Calculation (min): 20 min  PT Assessment / Plan / Recommendation  History of Present Illness 57 y/o male who presented to ER after found on the floor by his mother. 3 days of diarrhea and cough - fever of 105 in the ER. Has a h/o Gout and HTN . Pt with legnionella PNA, severe sepsis, transient blindness, HTN, encephalopathy, renal mass   PT Comments   Pt greatly limited for OOB transfers today secondary to Lt LE pain/gout. Pt unable to ambulate today due to pain. Pt motivated and willing to work with therapies today; in much higher spirits. Pt will benefit from CIR consult. Pt unable to D/C home with mother at this level of mobility.   Follow Up Recommendations  CIR;Supervision/Assistance - 24 hour     Does the patient have the potential to tolerate intense rehabilitation     Barriers to Discharge        Equipment Recommendations  Rolling walker with 5" wheels    Recommendations for Other Services Rehab consult  Frequency Min 3X/week   Progress towards PT Goals Progress towards PT goals: Progressing toward goals  Plan Discharge plan needs to be updated    Precautions / Restrictions Precautions Precautions: Fall Restrictions Weight Bearing Restrictions: No   Pertinent Vitals/Pain 10/10 in Lt LE. patient repositioned for comfort    Mobility  Bed Mobility Bed Mobility: Supine to Sit;Sit to Supine;Scooting to HOB Supine to Sit: 6: Modified independent (Device/Increase time);With rails;HOB elevated Sit to Supine: 3: Mod assist (for LEs) Scooting to HOB: 5: Supervision (in sitting) Details for Bed Mobility Assistance: pt with difficulty liftintg LEs onto bed; requires (A) to bring LEs into supine position; cues for hand placement and sequencing  Transfers Transfers: Sit to Stand;Stand to Sit;Stand Pivot Transfers Sit to Stand: 1: +2  Total assist;With upper extremity assist;From bed;From chair/3-in-1 Sit to Stand: Patient Percentage: 20% Stand to Sit: 1: +2 Total assist;With upper extremity assist;To bed;To chair/3-in-1 Stand to Sit: Patient Percentage: 20% Stand Pivot Transfers: 1: +2 Total assist;From elevated surface;With armrests Stand Pivot Transfers: Patient Percentage: 20% Details for Transfer Assistance: pt greatly limited today with transfers due to gout/ LE pain  Ambulation/Gait Ambulation/Gait Assistance: Not tested (comment) Stairs: No Wheelchair Mobility Wheelchair Mobility: No         PT Diagnosis:    PT Problem List:   PT Treatment Interventions:     PT Goals (current goals can now be found in the care plan section) Acute Rehab PT Goals Patient Stated Goal: to not have pain in Lt knee  PT Goal Formulation: With patient Time For Goal Achievement: 01/22/13 Potential to Achieve Goals: Good  Visit Information  Last PT Received On: 01/18/13 Assistance Needed: +2 PT/OT Co-Evaluation/Treatment: Yes History of Present Illness: 57 y/o male who presented to ER after found on the floor by his mother. 3 days of diarrhea and cough - fever of 105 in the ER. Has a h/o Gout and HTN . Pt with legnionella PNA, severe sepsis, transient blindness, HTN, encephalopathy, renal mass    Subjective Data  Subjective: pt sitting EOB with OT present; pt with difficulty WB through Lt LE today and OT having difficulty transferring pt independently-- requiring co-treatment  Patient Stated Goal: to not have pain in Lt knee    Cognition  Cognition Arousal/Alertness: Awake/alert Behavior During Therapy: WFL for tasks assessed/performed Overall Cognitive Status: Within Functional Limits  for tasks assessed    Balance  Balance Balance Assessed: Yes Static Sitting Balance Static Sitting - Balance Support: Feet supported;No upper extremity supported Static Sitting - Level of Assistance: 7: Independent Static Sitting -  Comment/# of Minutes: 10 Static Standing Balance Static Standing - Balance Support: Bilateral upper extremity supported;During functional activity Static Standing - Level of Assistance: 1: +2 Total assist Static Standing - Comment/# of Minutes: pt requires 2+ (A) to maintain standing balance   End of Session PT - End of Session Equipment Utilized During Treatment: Gait belt;Oxygen Activity Tolerance: Patient limited by pain Patient left: in bed;with call bell/phone within reach Nurse Communication: Mobility status   GP     Gustavus Bryant, [place] 01/18/2013, 12:40 PM

## 2013-01-18 NOTE — Preoperative (Signed)
Beta Blockers   Reason not to administer Beta Blockers:Not Applicable 

## 2013-01-18 NOTE — Interval H&P Note (Signed)
History and Physical Interval Note:  01/18/2013 2:24 PM  Brian Lawson  has presented today for surgery, with the diagnosis of /  The various methods of treatment have been discussed with the patient and family. After consideration of risks, benefits and other options for treatment, the patient has consented to  Procedure(s): [place] (Right) as a surgical intervention .  The patient's history has been reviewed, patient examined, no change in status, stable for surgery.  I have reviewed the patient's chart and labs.  Questions were answered to the patient's satisfaction.     FIELDS,CHARLES E

## 2013-01-18 NOTE — Op Note (Signed)
Procedure: Right basilic vein transposition fistula first stage  Preoperative diagnosis: End-stage renal disease  Postoperative diagnosis: Same  Anesthesia: Gen.  Assistant: Gerri Lins, PAC, Wray Kearns PA-C  Operative findings: 3 mm left basilic vein, 3 mm left brachial artery  Operative details: After obtaining informed consent, the patient was taken to the operating room. The patient was placed in supine position operating table. After induction of general anesthesia, the patient's entire right upper extremity was prepped and draped in the usual sterile fashion. A longitudinal incision was made just below the antecubital crease in order to expose the basilic vein. The vein was of marginal quality approximately 2.5 mm in diameter.  Care was taken to try to not injure any sensory nerves and all the motor nerves were identified and protected. The vein was dissected free circumferentially and small side branches ligated and divided between silk ties or clips. An additional incision was made just above this and the vein was of better quality 71mm.  I decided to stage the basilic fistula at this point due to the marginal vein.  Next the brachial artery was exposed by making an additional longitudinal incision just medial and parallel to the vein harvest incision at  the antecubital crease. The artery was approximately 3 mm in diameter. This was dissected free circumferentially. Vessel loops were placed around it. There was some spasm within the artery after dissecting it free. Next the distal basilic vein was ligated with a 2 silk tie and the vein transected.  The vein was gently distended with heparinized saline.  The patient was given 5000 units of intravenous heparin. Vessel loops were used to control the artery proximally and distally. A longitudinal arteriotomy was made with an 11 blade.  The vein was brought under a skin bridge to the brachial artery.  The vein was cut to length and sewn end  of vein to side of artery using a running 7-0 Prolene suture. Just prior to completion of the anastomosis, it was forebled backbled and thoroughly flushed. The anastomosis was secured and the Vesseloops were released.  There was a palpable thrill in the proximal aspect of the fistula. There was also good Doppler flow throughout the course of the fistula. At this point all subcutaneous tissues were reapproximated using running 3-0 Vicryl suture. All skin incisions were closed with running 4  0 Vicryl subcuticular stitch. Dermabond was applied to all incisions. The patient tolerated the procedure well and there were no complications. Instrument sponge needle counts were correct at the end of the case. The patient was taken to the recovery room in stable condition.  Ruta Hinds, MD [facility] Office: [phone] Pager: [phone]

## 2013-01-18 NOTE — Anesthesia Procedure Notes (Signed)
Procedure Name: LMA Insertion Date/Time: 01/18/2013 3:38 PM Performed by: Julian Reil Pre-anesthesia Checklist: Patient identified, Emergency Drugs available, Suction available and Patient being monitored Patient Re-evaluated:Patient Re-evaluated prior to inductionOxygen Delivery Method: Circle system utilized Preoxygenation: Pre-oxygenation with 100% oxygen Intubation Type: IV induction LMA: LMA inserted LMA Size: 5.0 Tube type: Oral Number of attempts: 1 Placement Confirmation: positive ETCO2 and breath sounds checked- equal and bilateral Tube secured with: Tape Dental Injury: Teeth and Oropharynx as per pre-operative assessment

## 2013-01-18 NOTE — Transfer of Care (Signed)
Immediate Anesthesia Transfer of Care Note  Patient: Brian Lawson  Procedure(s) Performed: Procedure(s): [place] (Right)  Patient Location: PACU  Anesthesia Type:General  Level of Consciousness: awake, alert , oriented and patient cooperative  Airway & Oxygen Therapy: Patient Spontanous Breathing and Patient connected to nasal cannula oxygen  Post-op Assessment: Report given to PACU RN, Post -op Vital signs reviewed and stable and Patient moving all extremities  Post vital signs: Reviewed and stable  Complications: No apparent anesthesia complications

## 2013-01-18 NOTE — H&P (View-Only) (Signed)
[facility] Consult  Reason for Consult:  ESRD Referring Physician:  Mercy Moore  [id number]  History of Present Illness: This is a 57 y.o. male who presented to the ED a little over a week ago after being found unresponsive in his home.  He states that he was not unconscious, just "off".  He was brought to the hospital by EMS and was oriented at arrival.  He has hx of fevers, chills and diarrhea for about a week.   CXR revealed LLL PNA.  His creatinine has continued to rise.  He did have a urine antigen for legionella was positive and is on Levaquin for PNA.  He underwent a renal ultrasound, which revealed a large mass on the left kidney and a urology consult is obtained.  An MRI was obtained, which confirmed an 8.4x9.5 cm complex cystic and solid mass off the lower/mid pole of the kidney. He is on Levoquin for PNA.  Past Medical History  Diagnosis Date  . Hypertension   . Gout    Past Surgical History  Procedure Laterality Date  . Hernia repair      No Known Allergies  Prior to Admission medications   Medication Sig Start Date End Date Taking? Authorizing Provider  allopurinol (ZYLOPRIM) 100 MG tablet Take 100 mg by mouth daily.   Yes Historical Provider, MD  lisinopril-hydrochlorothiazide (PRINZIDE,ZESTORETIC) 10-12.5 MG per tablet Take 1 tablet by mouth daily.   Yes Historical Provider, MD    Statin:  no Beta Blocker:  no Aspirin:  no ACEI:  no ARB:  no Other antiplatelets/anticoagulants:  no  History   Social History  . Marital Status: Single    Spouse Name: N/A    Number of Children: N/A  . Years of Education: N/A   Occupational History  . Not on file.   Social History Main Topics  . Smoking status: Former Research scientist (life sciences)  . Smokeless tobacco: Never Used  . Alcohol Use: Yes     Comment: ocassionally  . Drug Use: No  . Sexual Activity: Not on file   Other Topics Concern  . Not on file   Social History Narrative  . No narrative on file     Family Hx:  Mother is living at age 38 without medical issues Father deceased at age 72 with throat cancer Sister living age 71's with HTN  ROS: [x]  Positive   [ ]  Negative   [ ]  All sytems reviewed and are negative  Cardiovascular: []  chest pain/pressure []  palpitations []  SOB lying flat []  DOE []  pain in legs while walking []  pain in legs at rest []  pain in legs at night []  non-healing ulcers []  hx of DVT []  swelling in legs   Pulmonary: []  productive cough []  asthma/wheezing []  home O2  Neurologic: []  weakness in []  arms []  legs []  numbness in []  arms []  legs []  hx of CVA []  mini stroke [] difficulty speaking or slurred speech []  temporary loss of vision in one eye []  dizziness  Hematologic: []  hx of cancer []  bleeding problems []  problems with blood clotting easily  Endocrine:   []  diabetes []  thyroid disease  GI []  vomiting blood []  blood in stool [x]  diarrhea  GU: [x]  CKD4-continues to make urine/renal failure []  HD--[]  M/W/F or []  T/T/S []  burning with urination []  blood in urine [x]  kidney mass  Psychiatric: []  anxiety []  depression  Musculoskeletal: [x]  arthritis [x]  gout []  joint pain  Integumentary: []  rashes []  ulcers  Constitutional: [x]   fever [x]  chills   Physical Examination  Filed Vitals:   01/13/13 0950  BP: 148/91  Pulse: 66  Temp: 98.1 F (36.7 C)  Resp: 18   Body mass index is 27.92 kg/(m^2).  General:  WDWN in NAD Gait: Not observed HENT: WNL, normocephalic Eyes: Pupils equal Pulmonary: normal non-labored breathing, without Rales, rhonchi,  wheezing Cardiac: regular, without  Murmurs, rubs or gallops; without carotid bruits Abdomen: soft, NT/ND, no masses Skin: without rashes, without ulcers  Vascular Exam/Pulses:+ palpable radial and ulnar pulses bilaterally; + palpable pedal pulses bilaterally Extremities: without ischemic changes, without Gangrene , without cellulitis; without open wounds;   Musculoskeletal: no muscle wasting or atrophy  Neurologic: A&O X 3; Appropriate Affect ; SENSATION: normal; MOTOR FUNCTION:  moving all extremities equally. Speech is fluent/normal  CBC    Component Value Date/Time   WBC 9.7 01/10/2013 0500   RBC 4.38 01/10/2013 0500   HGB 12.5* 01/10/2013 0500   HCT 36.0* 01/10/2013 0500   PLT 433* 01/10/2013 0500   MCV 82.2 01/10/2013 0500   MCH 28.5 01/10/2013 0500   MCHC 34.7 01/10/2013 0500   RDW 16.6* 01/10/2013 0500   LYMPHSABS 1.0 01/01/2013 1859   MONOABS 0.7 01/01/2013 1859   EOSABS 0.0 01/01/2013 1859   BASOSABS 0.0 01/01/2013 1859    BMET    Component Value Date/Time   NA 141 01/13/2013 0504   K 4.6 01/13/2013 0504   CL 102 01/13/2013 0504   CO2 25 01/13/2013 0504   GLUCOSE 187* 01/13/2013 0504   BUN 112* 01/13/2013 0504   CREATININE 7.45* 01/13/2013 0504   CALCIUM 9.3 01/13/2013 0504   GFRNONAA 7* 01/13/2013 0504   GFRAA 8* 01/13/2013 0504    COAGS: Lab Results  Component Value Date   INR 1.17 01/03/2013     Non-Invasive Vascular Imaging:  Vein mapping has been ordered.  Pt states it was done this morning.   ASSESSMENT: This is a 57 y.o. male with CKD 4 in need of permanent HD access.  PLAN: -will await vein mapping and then formulate plan when to place access.  -will try to schedule this week. The pt is left handed.   Leontine Locket, PA-C [facility] [phone]  Agree with above assessment Veins are not good Appears that he will need basilic vein transposition He is left handed so we will schedule for right upper extremity basilic vein transposition on Friday although if found not to be suitable may require left upper extremity basilic vein transposition

## 2013-01-18 NOTE — Progress Notes (Signed)
Occupational Therapy Treatment Patient Details Name: Brian Lawson MRN: [id number] DOB: 1955-03-31 Today's Date: 01/18/2013 Time: [id number] OT Time Calculation (min): 45 min  OT Assessment / Plan / Recommendation  History of present illness 57 y/o male who presented to ER after found on the floor by his mother. 3 days of diarrhea and cough - fever of 105 in the ER. Has a h/o Gout and HTN . Pt with legnionella PNA, severe sepsis, transient blindness, HTN, encephalopathy, renal mass   OT comments  Pt requiring assistance of 2 today for OOB, unable to ambulate due to pain in LEs from gout.  In good spirits and very willing to work with therapies.  Follow Up Recommendations  Supervision/Assistance - 24 hour;CIR    Barriers to Discharge       Equipment Recommendations  3 in 1 bedside comode    Recommendations for Other Services Rehab consult  Frequency Min 2X/week   Progress towards OT Goals Progress towards OT goals: Not progressing toward goals - comment  Plan Discharge plan needs to be updated    Precautions / Restrictions Precautions Precautions: Fall Restrictions Weight Bearing Restrictions: No   Pertinent Vitals/Pain Pain in LEs, did not rate, repositioned    ADL  Grooming: Wash/dry hands;Brushing hair;Set up Where Assessed - Grooming: Unsupported sitting Upper Body Bathing: Minimal assistance Where Assessed - Upper Body Bathing: Unsupported sitting Toilet Transfer: +2 Total assistance Toilet Transfer: Patient Percentage: 20% Toilet Transfer Method: Stand pivot Toilet Transfer Equipment: Bedside commode Toileting - Clothing Manipulation and Hygiene: +1 Total assistance Where Assessed - Toileting Clothing Manipulation and Hygiene: Sit to stand from 3-in-1 or toilet Equipment Used: Rolling walker;Gait belt Transfers/Ambulation Related to ADLs: verbal cues for hand placement, pt only able to pivot today, unable to walk due to gout in B LEs. ADL Comments: Pt agreeable to  edge of bed activities and requesting OOB for toileting.    OT Diagnosis:    OT Problem List:   OT Treatment Interventions:     OT Goals(current goals can now be found in the care plan section)    Visit Information  Last OT Received On: 01/18/13 Assistance Needed: +2 PT/OT Co-Evaluation/Treatment: Yes History of Present Illness: 57 y/o male who presented to ER after found on the floor by his mother. 3 days of diarrhea and cough - fever of 105 in the ER. Has a h/o Gout and HTN . Pt with legnionella PNA, severe sepsis, transient blindness, HTN, encephalopathy, renal mass    Subjective Data      Prior Functioning       Cognition  Cognition Arousal/Alertness: Awake/alert Behavior During Therapy: WFL for tasks assessed/performed Overall Cognitive Status: Within Functional Limits for tasks assessed    Mobility  Bed Mobility Bed Mobility: Supine to Sit;Sit to Supine;Scooting to HOB Supine to Sit: 6: Modified independent (Device/Increase time);With rails;HOB elevated Sit to Supine: 3: Mod assist (for LEs) Scooting to HOB: 5: Supervision (in sitting) Transfers Transfers: Sit to Stand;Stand to Sit Sit to Stand: 1: +2 Total assist;With upper extremity assist;From bed;From chair/3-in-1 Sit to Stand: Patient Percentage: 20% Stand to Sit: 1: +2 Total assist;With upper extremity assist;To bed;To chair/3-in-1 Stand to Sit: Patient Percentage: 20% Details for Transfer Assistance: pt limited by gout/LE pain    Exercises      Balance Static Sitting Balance Static Sitting - Balance Support: Feet supported;No upper extremity supported Static Sitting - Level of Assistance: 7: Independent Static Sitting - Comment/# of Minutes: 10   End of Session OT -  End of Session Activity Tolerance: Patient limited by fatigue;Patient limited by pain Patient left: in bed;with call bell/phone within reach Nurse Communication: Mobility status (BM, urine emptied)  GO     Malka So 01/18/2013, 11:07 AM [phone]

## 2013-01-19 ENCOUNTER — Telehealth: Payer: Self-pay | Admitting: Vascular Surgery

## 2013-01-19 LAB — BASIC METABOLIC PANEL
BUN: 97 mg/dL — ABNORMAL HIGH (ref 6–23)
CO2: 23 mEq/L (ref 19–32)
Calcium: 9.5 mg/dL (ref 8.4–10.5)
Creatinine, Ser: 4.26 mg/dL — ABNORMAL HIGH (ref 0.50–1.35)
GFR calc Af Amer: 16 mL/min — ABNORMAL LOW (ref >90)
Glucose, Bld: 130 mg/dL — ABNORMAL HIGH (ref 70–99)
Sodium: 137 mEq/L (ref 135–145)

## 2013-01-19 MED ORDER — NEPRO/CARBSTEADY PO LIQD: 237.0000 mL | Freq: Two times a day (BID) | ORAL | Status: DC

## 2013-01-19 MED ORDER — SODIUM POLYSTYRENE SULFONATE 15 GM/60ML PO SUSP: 30.0000 g | Freq: Once | ORAL | Status: DC

## 2013-01-19 MED ORDER — SODIUM POLYSTYRENE SULFONATE 15 GM/60ML PO SUSP
45.0000 g | Freq: Once | ORAL | Status: AC
  Administered 2013-01-19: 45 g via ORAL
  Filled 2013-01-19: qty 180

## 2013-01-19 NOTE — Plan of Care (Signed)
Problem: Food- and Nutrition-Related Knowledge Deficit (NB-1.1) Goal: Nutrition education Formal process to instruct or train a patient/client in a skill or to impart knowledge to help patients/clients voluntarily manage or modify food choices and eating behavior to maintain or improve health. Outcome: Completed/Met Date Met:  01/19/13  Nutrition Education Note  RD consulted for Renal Education by RN. Provided Kidney Disease Pyramid to patient. Reviewed food groups and provided written recommended serving sizes specifically determined for patient's current nutritional status. Explained why diet restrictions are needed and provided lists of foods to limit/avoid that are high potassium, sodium, and phosphorus. Provided specific recommendations on safer alternatives of these foods. Strongly encouraged compliance of this diet. Teach back method used.  Expect good compliance.  Body mass index is 26.1 kg/(m^2). Pt meets criteria for Overweight based on current BMI.  Current diet order is Renal, patient is consuming approximately 50-100% of meals at this time. Labs and medications reviewed. No further nutrition interventions warranted at this time. RD contact information provided. If additional nutrition issues arise, please re-consult RD.  Inda Coke MS, RD, LDN Pager: [phone] After-hours pager: [phone]

## 2013-01-19 NOTE — Progress Notes (Signed)
Physical Therapy Treatment Patient Details Name: Brian Lawson MRN: [id number] DOB: May 19, 1955 Today's Date: 01/19/2013 Time: 1425-1450 PT Time Calculation (min): 25 min  PT Assessment / Plan / Recommendation  History of Present Illness pt has had decline in functional status due to gout pain and immoblity in knees   PT Comments   Pt has limited ability to do activities with legs, especially knees due to gout pain. He is able to attempt, however, and would benefit from continued Inpatient PT prior to d/c to home.  Will ask CIR to come and see him again.  Follow Up Recommendations  CIR;Supervision/Assistance - 24 hour     Does the patient have the potential to tolerate intense rehabilitation     Barriers to Discharge        Equipment Recommendations  Rolling walker with 5" wheels    Recommendations for Other Services Rehab consult  Frequency     Progress towards PT Goals Progress towards PT goals: Not progressing toward goals - comment (limited by pain in legs)  Plan Current plan remains appropriate    Precautions / Restrictions Precautions Precautions: Fall   Pertinent Vitals/Pain Pt cries out with pain with movement of LEs    Mobility  Bed Mobility Bed Mobility: Sit to Supine Sitting - Scoot to Edge of Bed: 3: Mod assist Sit to Supine: 3: Mod assist Scooting to HOB: 3: Mod assist Details for Bed Mobility Assistance: pt is able to use arms to assist with mobility, but is not able to assist with legs due to pain Transfers Transfers: Sit to Stand;Stand to Sit;Stand Pivot Transfers Sit to Stand: 1: +2 Total assist Sit to Stand: Patient Percentage: 20% Stand to Sit: 1: +2 Total assist Stand to Sit: Patient Percentage: 20% Stand Pivot Transfers: 1: +2 Total assist Stand Pivot Transfers: Patient Percentage: 20% Details for Transfer Assistance: pt is unable to move legs much to assist with transfers or bend them back underneath him to position for effective sit to stand. He  maintained semi hip and knee flexion while pivoting to get back to bed from bedside commode Ambulation/Gait Assistive device: Rolling walker Ambulation/Gait Assistance Details: pt unable to progress gait today due to pain and decreased ROM    Exercises General Exercises - Lower Extremity Ankle Circles/Pumps: AROM;Both;5 reps Quad Sets: AROM;Strengthening;Both;5 reps Gluteal Sets: AROM;Strengthening;Both;5 reps Long Arc Quad: AROM;Both;10 reps (limited ROM, but pt able to move legs) Hip ABduction/ADduction: AAROM;Both;10 reps;Supine Toe Raises: AROM;Both;10 reps;Seated Heel Raises: AROM;Both;10 reps;Seated Other Exercises Other Exercises: encouraged total body extension in supine Other Exercises: both hip internal and external rotation with knees extended x 10 reps in supine   PT Diagnosis:    PT Problem List:   PT Treatment Interventions:     PT Goals (current goals can now be found in the care plan section)    Visit Information  Last PT Received On: 01/19/13 Assistance Needed: +2 History of Present Illness: pt has had decline in functional status due to gout pain and immoblity in knees    Subjective Data      Cognition  Cognition Arousal/Alertness: Awake/alert Behavior During Therapy: WFL for tasks assessed/performed Overall Cognitive Status: Within Functional Limits for tasks assessed    Balance  Static Sitting Balance Static Sitting - Balance Support: Feet supported;No upper extremity supported Static Sitting - Comment/# of Minutes: 59minutes- worked on sitting balance with trunk extension and unilateral and bilateral UE ex  Static Standing Balance Static Standing - Balance Support: Bilateral upper extremity supported Static  Standing - Level of Assistance: 3: Mod assist  End of Session PT - End of Session Activity Tolerance: Patient limited by pain Patient left: with call bell/phone within reach;in bed Nurse Communication: Mobility status   GP    Donato Heinz.  [place], [place] 01/19/2013, 3:14 PM

## 2013-01-19 NOTE — Progress Notes (Signed)
[name] ROUNDING NOTE   Subjective:   Interval History:no complaints  Objective:  Vital signs in last 24 hours:  Temp:  [97 F (36.1 C)-98.9 F (37.2 C)] 98.9 F (37.2 C) (11/11 0446) Pulse Rate:  [70-84] 76 (11/11 0446) Resp:  [13-20] 17 (11/11 0446) BP: (133-178)/(76-92) 136/82 mmHg (11/11 0446) SpO2:  [91 %-100 %] 98 % (11/11 0446) Weight:  [94.711 kg (208 lb 12.8 oz)] 94.711 kg (208 lb 12.8 oz) (11/10 2235)  Weight change:  Filed Weights   01/15/13 2159 01/16/13 2201 01/18/13 2235  Weight: 98.385 kg (216 lb 14.4 oz) 95.754 kg (211 lb 1.6 oz) 94.711 kg (208 lb 12.8 oz)    Intake/Output: I/O last 3 completed shifts: In: 660 [P.O.:360; I.V.:300] Out: 2825 [Urine:2775; Blood:50]   Intake/Output this shift:     CVS- RRR RS- CTA ABD- BS present soft non-distended EXT- no edema   Basic Metabolic Panel:  Recent Labs Lab 01/15/13 0500 01/16/13 1220 01/17/13 0500 01/18/13 0443 01/19/13 0515  NA 140 137 137 134* 137  K 4.7 5.2* 5.0 5.6* 6.3*  CL 103 100 100 99 101  CO2 25 26 26 23 23   GLUCOSE 102* 126* 114* 141* 130*  BUN 98* 91* 87* 90* 97*  CREATININE 6.10* 5.58* 5.32* 4.74* 4.26*  CALCIUM 9.3 9.3 9.4 9.6 9.5  PHOS 7.0*  --   --   --   --     Liver Function Tests:  Recent Labs Lab 01/15/13 0500  ALBUMIN 2.5*   No results found for this basename: LIPASE, AMYLASE,  in the last 168 hours No results found for this basename: AMMONIA,  in the last 168 hours  CBC:  Recent Labs Lab 01/14/13 0456  WBC 7.6  HGB 11.4*  HCT 34.7*  MCV 86.1  PLT 419*    Cardiac Enzymes: No results found for this basename: CKTOTAL, CKMB, CKMBINDEX, TROPONINI,  in the last 168 hours  BNP: No components found with this basename: POCBNP,   CBG: No results found for this basename: GLUCAP,  in the last 168 hours  Microbiology: Results for orders placed during the hospital encounter of 01/01/13  CULTURE, BLOOD (ROUTINE X 2)     Status: None    Collection Time    01/01/13  8:30 PM      Result Value Range Status   Specimen Description BLOOD RIGHT ARM   Final   Special Requests BOTTLES DRAWN AEROBIC AND ANAEROBIC 10CC   Final   Culture  Setup Time     Final   Value: 01/02/2013 01:13     Performed at [facility]   Culture     Final   Value: NO GROWTH 5 DAYS     Performed at [facility]   Report Status 01/08/2013 FINAL   Final  CULTURE, BLOOD (ROUTINE X 2)     Status: None   Collection Time    01/01/13  8:45 PM      Result Value Range Status   Specimen Description BLOOD RIGHT HAND   Final   Special Requests BOTTLES DRAWN AEROBIC AND ANAEROBIC 10CC   Final   Culture  Setup Time     Final   Value: 01/02/2013 01:41     Performed at [facility]   Culture     Final   Value: NO GROWTH 5 DAYS     Performed at [facility]   Report Status 01/08/2013 FINAL   Final  MRSA PCR SCREENING  Status: None   Collection Time    01/01/13 10:25 PM      Result Value Range Status   MRSA by PCR NEGATIVE  NEGATIVE Final   Comment:            The GeneXpert MRSA Assay (FDA     approved for NASAL specimens     only), is one component of a     comprehensive MRSA colonization     surveillance program. It is not     intended to diagnose MRSA     infection nor to guide or     monitor treatment for     MRSA infections.  SURGICAL PCR SCREEN     Status: None   Collection Time    01/18/13 12:18 AM      Result Value Range Status   MRSA, PCR NEGATIVE  NEGATIVE Final   Staphylococcus aureus NEGATIVE  NEGATIVE Final   Comment:            The Xpert SA Assay (FDA     approved for NASAL specimens     in patients over 75 years of age),     is one component of     a comprehensive surveillance     program.  Test performance has     been validated by [employer] for patients greater     than or equal to 68 year old.     It is not intended     to diagnose infection nor to     guide or monitor treatment.     Coagulation Studies: No results found for this basename: LABPROT, INR,  in the last 72 hours  Urinalysis: No results found for this basename: COLORURINE, APPERANCEUR, LABSPEC, PHURINE, GLUCOSEU, HGBUR, BILIRUBINUR, KETONESUR, PROTEINUR, UROBILINOGEN, NITRITE, LEUKOCYTESUR,  in the last 72 hours    Imaging: No results found.   Medications:   . sodium chloride 20 mL/hr at 01/08/13 1900   . antiseptic oral rinse  15 mL Mouth Rinse BID  . calcitRIOL  0.25 mcg Oral Daily  . calcium acetate  1,334 mg Oral TID WC  . cloNIDine  0.2 mg Oral TID  . feeding supplement (NEPRO CARB STEADY)  237 mL Oral BID BM  . heparin subcutaneous  5,000 Units Subcutaneous Q8H  . hydrALAZINE  50 mg Oral TID  . isosorbide dinitrate  5 mg Oral BID  . predniSONE  40 mg Oral Q breakfast  . sodium bicarbonate  650 mg Oral BID  . Vitamin D (Ergocalciferol)  50,000 Units Oral Q7 days   acetaminophen, acetaminophen, alum & mag hydroxide-simeth, HYDROmorphone (DILAUDID) injection, influenza vac split quadrivalent PF, labetalol, ondansetron (ZOFRAN) IV, ondansetron, oxyCODONE, pneumococcal 23 valent vaccine, sodium chloride, white petrolatum  Assessment/ Plan:  This is a 57 y.o. male who presented to the ED a little over a week ago after being found unresponsive in his home. He states that he was not unconscious, just "off". He was brought to the hospital by EMS and was oriented at arrival. He has hx of fevers, chills and diarrhea for about a week. CXR revealed LLL PNA. His creatinine has continued to rise. He did have a urine antigen for legionella was positive and is on Levaquin for PNA. He underwent a renal ultrasound, which revealed a large mass on the left kidney and a urology consult is obtained.   1. 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 Renal cell Cancer follow up with  urology for nephrectomy   3. Hyperkalemia Kayexalate and low potassium diet will switch to nepro supplements  4. Follow up at  [facility]    LOS: 18 Brian Lawson W @TODAY @9 :26 AM

## 2013-01-19 NOTE — Progress Notes (Signed)
[facility] PROGRESS NOT  Interim summary Patient is a pleasant 58 year old gentleman with a past medical history of hypertension and gout who presented to the emergency room on 01/02/2013, found on the floor of his home by his mother unresponsive. Over the 3 days prior to hospitalization patient had increasing cough with associated shortness of breath, weakness, minimal by mouth intake as well as diarrhea. On presentation was found to be febrile with a temperature of 105. Initial imaging studies included a chest x-ray which showed left lower lobe patchy airspace disease consistent with pneumonia. He  met sepsis criteria and was admitted to the step down unit. He was initially started on broad-spectrum empiric IV antimicrobial therapy with IV vancomycin and Zosyn. Initial CT scan of brain showed no acute intracranial abnormality. However given unresponsiveness he was seen and evaluated by neurology. There was concern that this could be a vascular insult for which MRI of brain was recommended. On 01/03/2013 MRI showed no acute intracranial abnormality. He also presented with acute renal failure having a creatinine of 3.93 and BUN of 54 on 01/01/2013. His renal failure was further worked up with bilateral kidney ultrasound that was done on 01/02/2013. This study revealed 9 cm masslike area of the left kidney which did not demonstrate significant internal blood flow. Was felt that this likely represented a neoplasm. This was further worked up with an MRI of abdomen without contrast performed on 01/03/2013 which demonstrated an 8.4 x 8.9 x 9.5 cm mass that extended laterally from the mid and lower pole of the left kidney. Urology was consulted as patient was evaluated by Dr.Wrenn, who recommended followup with renal surgeon for consideration of partial nephrectomy or radical nephrectomy. Dr. Jeffie Pollock reported on a followup note that's procedure could be done laparoscopically but that's this will be further  discussed at his office after discharge. He recommended that patient followup with him in 10-14 days after discharge for surgical planning. There were concerns by nephrology that patient could require hemodialysis in the near future. Nephrology recommended AV fistula placement. Initially patient was apprehensive about going this procedure. However he later changed his mind and on 01/18/2013 he underwent AV fistula placement by vascular surgery. Other issues during this hospitalization, he was found to have Legionella pneumonia as his IV antibiotics were eventually transitioned to oral Levaquin receiving a total of 10 days. From a respiratory standpoint he is stable, has remained afebrile, nontoxic, and overall much improved. He is presently off on antimicrobial therapy.  His hospitalization has been complicated by acute gout, for which she was started on prednisone. He stated that culture seen in the past has not been as effective as prednisone. He has been evaluated by physical therapy who has recommended skilled nursing facility placement. An order has been placed for social work consult for SNF placement.                                                                            Assessment/Plan:.  Acute hypoxic respiratory failure due to Legionella PNA  - Significantly improved.   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  - Dr. Jeffie Pollock with Urology recommended outpt f/u  with resection when more stable medically. Will require left nephrectomy.  Acute renal failure -Patient's kidney function continues to improve, creatinine down to 4.26 from 4.74 on yesterday's lab work. Status post AV fistula placement.   Acute gout - Continue oral prednisone 40 mg by mouth daily, patient reports feeling better.  Malignant HYPERTENSION  -Blood pressures are stable, with systolic blood pressures fluctuate between 130s to 140s.    Code Status: full Family  Communication: Plan discussed with patient's family members present at bedside Disposition Plan: Plan for transition to skilled nursing facility based on recommendations from physical therapy. I have placed a consult to social work for placement.   Consultants:  Urology  Nephrology  neurology  Procedures:  EEG  Antibiotics: Vanc/ Zosyn 10/25 >>> 10/26  Levaquin 10/27 >>>  Zithromax 10/25 >>> 10/27  Rocephin 10/25 >>> 10/26   HPI/Subjective: Patient states prednisone is helping his gout. Tolerating by mouth intake, has no other complaints today.  Objective: Filed Vitals:   01/18/13 2235 01/19/13 0446 01/19/13 0955 01/19/13 1330  BP: 133/76 136/82 124/81 137/68  Pulse: 84 76 97 83  Temp: 98.4 F (36.9 C) 98.9 F (37.2 C) 97.9 F (36.6 C) 98.3 F (36.8 C)  TempSrc: Oral Oral Oral Oral  Resp: 17 17 20 18   Height: 6\' 3"  (1.905 m)     Weight: 94.711 kg (208 lb 12.8 oz)     SpO2: 97% 98% 100% 96%    Intake/Output Summary (Last 24 hours) at 01/19/13 1525 Last data filed at 01/19/13 1434  Gross per 24 hour  Intake    780 ml  Output   2551 ml  Net  -1771 ml   Filed Weights   01/15/13 2159 01/16/13 2201 01/18/13 2235  Weight: 98.385 kg (216 lb 14.4 oz) 95.754 kg (211 lb 1.6 oz) 94.711 kg (208 lb 12.8 oz)    Exam:  General: Alert, awake, oriented x3, in no acute distress.  HEENT: No bruits, no goiter.  Heart: Regular rate and rhythm, without murmurs, rubs, gallops.  Lungs: Good air movement, clear to asucultation Abdomen: Soft, nontender, nondistended, positive bowel sounds.  Neuro: Grossly intact, nonfocal. Extremities: Patient reporting pain to palpation over bilateral ankles, with swelling over right ankle greater than left ankle.   Data Reviewed: Basic Metabolic Panel:  Recent Labs Lab 01/15/13 0500 01/16/13 1220 01/17/13 0500 01/18/13 0443 01/19/13 0515  NA 140 137 137 134* 137  K 4.7 5.2* 5.0 5.6* 6.3*  CL 103 100 100 99 101  CO2 25 26 26 23 23    GLUCOSE 102* 126* 114* 141* 130*  BUN 98* 91* 87* 90* 97*  CREATININE 6.10* 5.58* 5.32* 4.74* 4.26*  CALCIUM 9.3 9.3 9.4 9.6 9.5  PHOS 7.0*  --   --   --   --    Liver Function Tests:  Recent Labs Lab 01/15/13 0500  ALBUMIN 2.5*   No results found for this basename: LIPASE, AMYLASE,  in the last 168 hours No results found for this basename: AMMONIA,  in the last 168 hours CBC:  Recent Labs Lab 01/14/13 0456  WBC 7.6  HGB 11.4*  HCT 34.7*  MCV 86.1  PLT 419*   Cardiac Enzymes: No results found for this basename: CKTOTAL, CKMB, CKMBINDEX, TROPONINI,  in the last 168 hours BNP (last 3 results) No results found for this basename: PROBNP,  in the last 8760 hours CBG: No results found for this basename: GLUCAP,  in the last 168 hours  Recent Results (from  the past 240 hour(s))  SURGICAL PCR SCREEN     Status: None   Collection Time    01/18/13 12:18 AM      Result Value Range Status   MRSA, PCR NEGATIVE  NEGATIVE Final   Staphylococcus aureus NEGATIVE  NEGATIVE Final   Comment:            The Xpert SA Assay (FDA     approved for NASAL specimens     in patients over 33 years of age),     is one component of     a comprehensive surveillance     program.  Test performance has     been validated by [employer] for patients greater     than or equal to 45 year old.     It is not intended     to diagnose infection nor to     guide or monitor treatment.     Studies: No results found.  Scheduled Meds: . antiseptic oral rinse  15 mL Mouth Rinse BID  . calcitRIOL  0.25 mcg Oral Daily  . calcium acetate  1,334 mg Oral TID WC  . cloNIDine  0.2 mg Oral TID  . heparin subcutaneous  5,000 Units Subcutaneous Q8H  . hydrALAZINE  50 mg Oral TID  . isosorbide dinitrate  5 mg Oral BID  . predniSONE  40 mg Oral Q breakfast  . sodium bicarbonate  650 mg Oral BID  . Vitamin D (Ergocalciferol)  50,000 Units Oral Q7 days   Continuous Infusions: . sodium chloride 20 mL/hr  at 01/08/13 1900     Brian Lawson  [facility] Pager [phone]. If 8PM-8AM, please contact night-coverage at [email or web address], password [facility] 01/19/2013, 3:25 PM  LOS: 18 days

## 2013-01-19 NOTE — Progress Notes (Signed)
Right hand warm 2+ DP pulse No numbness + thrill in fistula No hematoma Incisions clean Follow up 1 month  Ruta Hinds, MD [facility] Office: [phone] Pager: [phone]

## 2013-01-19 NOTE — Progress Notes (Signed)
01/19/2013 2:34 PM  Offered to get pt from [facility] to chair to sit for a while, and he declined.  Pt wishes to return back to bed at this time.  Will continue to offer activity. Princella Pellegrini

## 2013-01-19 NOTE — Clinical Social Work Psychosocial (Signed)
Clinical Social Work Department BRIEF PSYCHOSOCIAL ASSESSMENT 01/19/2013  Patient:  FERLIN, PETRENKO     Account Number:  [id number]     Admit date:  01/01/2013  Clinical Social Worker:  Frederico Hamman  Date/Time:  01/19/2013 04:14 PM  Referred by:  Physician  Date Referred:  01/19/2013 Referred for  SNF Placement   Other Referral:   Interview type:  Patient Other interview type:    PSYCHOSOCIAL DATA Living Status:  PARENTS Admitted from facility:   Level of care:   Primary support name:  Marta Antu Primary support relationship to patient:  PARENT Degree of support available:   Patient's mother is Marta Antu [phone].    CURRENT CONCERNS Current Concerns  Post-Acute Placement   Other Concerns:    SOCIAL WORK ASSESSMENT / PLAN CSW and CSW intern introduced self to patient. CSW talked with patient about discharge planning. CSW explained skilled facility search process and informed the patient that he will be updated with facility responses. The patient was given a list of facilities in [facility]. Patient has CSW contact information for any questions.   Assessment/plan status:  Psychosocial Support/Ongoing Assessment of Needs Other assessment/ plan:   Information/referral to community resources:   SNF    PATIENT'S/FAMILY'S RESPONSE TO PLAN OF CARE: Patient was alert and pleasant. Mr. Braverman has CSW's contact information for any additional assistance.

## 2013-01-19 NOTE — Telephone Encounter (Addendum)
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

## 2013-01-20 ENCOUNTER — Encounter (HOSPITAL_COMMUNITY): Payer: Self-pay | Admitting: Vascular Surgery

## 2013-01-20 LAB — CBC
HCT: 33.5 % — ABNORMAL LOW (ref 39.0–52.0)
Hemoglobin: 10.9 g/dL — ABNORMAL LOW (ref 13.0–17.0)
MCH: 28.5 pg (ref 26.0–34.0)
MCHC: 32.5 g/dL (ref 30.0–36.0)
Platelets: 247 10*3/uL (ref 150–400)

## 2013-01-20 LAB — BASIC METABOLIC PANEL
BUN: 91 mg/dL — ABNORMAL HIGH (ref 6–23)
Calcium: 9.4 mg/dL (ref 8.4–10.5)
Chloride: 104 mEq/L (ref 96–112)
Creatinine, Ser: 3.65 mg/dL — ABNORMAL HIGH (ref 0.50–1.35)
GFR calc non Af Amer: 17 mL/min — ABNORMAL LOW (ref >90)
Glucose, Bld: 131 mg/dL — ABNORMAL HIGH (ref 70–99)
Potassium: 4.7 mEq/L (ref 3.5–5.1)

## 2013-01-20 MED ORDER — MENTHOL 3 MG MT LOZG
1.0000 | LOZENGE | OROMUCOSAL | Status: DC | PRN
  Administered 2013-01-20: 3 mg via ORAL
  Filled 2013-01-20: qty 9

## 2013-01-20 MED ORDER — PHENOL 1.4 % MT LIQD
1.0000 | OROMUCOSAL | Status: DC | PRN
  Administered 2013-01-20: 1 via OROMUCOSAL
  Filled 2013-01-20: qty 177

## 2013-01-20 NOTE — Progress Notes (Signed)
[facility] ROUNDING NOTE   Subjective:   Interval History: no compliants  Objective:  Vital signs in last 24 hours:  Temp:  [97.9 F (36.6 C)-98.7 F (37.1 C)] 98.5 F (36.9 C) (11/12 1030) Pulse Rate:  [78-98] 78 (11/12 1030) Resp:  [18-20] 20 (11/12 1030) BP: (136-155)/(64-86) 142/64 mmHg (11/12 1030) SpO2:  [93 %-100 %] 95 % (11/12 1030) Weight:  [95.89 kg (211 lb 6.4 oz)] 95.89 kg (211 lb 6.4 oz) (11/11 2145)  Weight change: 1.179 kg (2 lb 9.6 oz) Filed Weights   01/16/13 2201 01/18/13 2235 01/19/13 2145  Weight: 95.754 kg (211 lb 1.6 oz) 94.711 kg (208 lb 12.8 oz) 95.89 kg (211 lb 6.4 oz)    Intake/Output: I/O last 3 completed shifts: In: 720 [P.O.:720] Out: 2878 [Urine:2875; Stool:3]   Intake/Output this shift:  Total I/O In: 240 [P.O.:240] Out: 475 [Urine:475]  CVS- RRR RS- CTA ABD- BS present soft non-distended EXT- no edema   Basic Metabolic Panel:  Recent Labs Lab 01/15/13 0500 01/16/13 1220 01/17/13 0500 01/18/13 0443 01/19/13 0515 01/19/13 1825 01/20/13 0445  NA 140 137 137 134* 137  --  141  K 4.7 5.2* 5.0 5.6* 6.3* 4.9 4.7  CL 103 100 100 99 101  --  104  CO2 25 26 26 23 23   --  23  GLUCOSE 102* 126* 114* 141* 130*  --  131*  BUN 98* 91* 87* 90* 97*  --  91*  CREATININE 6.10* 5.58* 5.32* 4.74* 4.26*  --  3.65*  CALCIUM 9.3 9.3 9.4 9.6 9.5  --  9.4  PHOS 7.0*  --   --   --   --   --   --     Liver Function Tests:  Recent Labs Lab 01/15/13 0500  ALBUMIN 2.5*   No results found for this basename: LIPASE, AMYLASE,  in the last 168 hours No results found for this basename: AMMONIA,  in the last 168 hours  CBC:  Recent Labs Lab 01/14/13 0456 01/20/13 0445  WBC 7.6 8.2  HGB 11.4* 10.9*  HCT 34.7* 33.5*  MCV 86.1 87.7  PLT 419* 247    Cardiac Enzymes: No results found for this basename: CKTOTAL, CKMB, CKMBINDEX, TROPONINI,  in the last 168 hours  BNP: No components found with this basename: POCBNP,    CBG: No results found for this basename: GLUCAP,  in the last 168 hours  Microbiology: Results for orders placed during the hospital encounter of 01/01/13  CULTURE, BLOOD (ROUTINE X 2)     Status: None   Collection Time    01/01/13  8:30 PM      Result Value Range Status   Specimen Description BLOOD RIGHT ARM   Final   Special Requests BOTTLES DRAWN AEROBIC AND ANAEROBIC 10CC   Final   Culture  Setup Time     Final   Value: 01/02/2013 01:13     Performed at [facility]   Culture     Final   Value: NO GROWTH 5 DAYS     Performed at [facility]   Report Status 01/08/2013 FINAL   Final  CULTURE, BLOOD (ROUTINE X 2)     Status: None   Collection Time    01/01/13  8:45 PM      Result Value Range Status   Specimen Description BLOOD RIGHT HAND   Final   Special Requests BOTTLES DRAWN AEROBIC AND ANAEROBIC 10CC   Final   Culture  Setup Time     Final   Value: 01/02/2013 01:41     Performed at [facility]   Culture     Final   Value: NO GROWTH 5 DAYS     Performed at [facility]   Report Status 01/08/2013 FINAL   Final  MRSA PCR SCREENING     Status: None   Collection Time    01/01/13 10:25 PM      Result Value Range Status   MRSA by PCR NEGATIVE  NEGATIVE Final   Comment:            The GeneXpert MRSA Assay (FDA     approved for NASAL specimens     only), is one component of a     comprehensive MRSA colonization     surveillance program. It is not     intended to diagnose MRSA     infection nor to guide or     monitor treatment for     MRSA infections.  SURGICAL PCR SCREEN     Status: None   Collection Time    01/18/13 12:18 AM      Result Value Range Status   MRSA, PCR NEGATIVE  NEGATIVE Final   Staphylococcus aureus NEGATIVE  NEGATIVE Final   Comment:            The Xpert SA Assay (FDA     approved for NASAL specimens     in patients over 74 years of age),     is one component of     a comprehensive surveillance     program.   Test performance has     been validated by [employer] for patients greater     than or equal to 60 year old.     It is not intended     to diagnose infection nor to     guide or monitor treatment.    Coagulation Studies: No results found for this basename: LABPROT, INR,  in the last 72 hours  Urinalysis: No results found for this basename: COLORURINE, APPERANCEUR, LABSPEC, PHURINE, GLUCOSEU, HGBUR, BILIRUBINUR, KETONESUR, PROTEINUR, UROBILINOGEN, NITRITE, LEUKOCYTESUR,  in the last 72 hours    Imaging: No results found.   Medications:   . sodium chloride 20 mL/hr at 01/08/13 1900   . antiseptic oral rinse  15 mL Mouth Rinse BID  . calcitRIOL  0.25 mcg Oral Daily  . calcium acetate  1,334 mg Oral TID WC  . cloNIDine  0.2 mg Oral TID  . heparin subcutaneous  5,000 Units Subcutaneous Q8H  . hydrALAZINE  50 mg Oral TID  . isosorbide dinitrate  5 mg Oral BID  . predniSONE  40 mg Oral Q breakfast  . sodium bicarbonate  650 mg Oral BID  . Vitamin D (Ergocalciferol)  50,000 Units Oral Q7 days   acetaminophen, acetaminophen, alum & mag hydroxide-simeth, HYDROmorphone (DILAUDID) injection, influenza vac split quadrivalent PF, labetalol, ondansetron (ZOFRAN) IV, ondansetron, oxyCODONE, pneumococcal 23 valent vaccine, sodium chloride, white petrolatum  Assessment/ Plan:  This is a 57 y.o. male who presented to the ED a little over a week ago after being found unresponsive in his home. He states that he was not unconscious, just "off". He was brought to the hospital by EMS and was oriented at arrival. He has hx of fevers, chills and diarrhea for about a week. CXR revealed LLL PNA. His creatinine has continued to rise. He did have a urine  antigen for legionella was positive and is on Levaquin for PNA. He underwent a renal ultrasound, which revealed a large mass on the left kidney and a urology consult is obtained.   1. 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 Renal cell Cancer follow up with  urology for nephrectomy   3. Hyperkalemia Kayexalate and low potassium diet will switch to nepro supplements   4. Follow up at [facility]   Will sign off for now   LOS: 19 Maryln Eastham W @TODAY @10 :47 AM

## 2013-01-20 NOTE — Progress Notes (Addendum)
[facility] PROGRESS NOT  Interim summary Patient is a pleasant 57 year old gentleman with a past medical history of hypertension and gout who presented to the emergency room on 01/02/2013, found on the floor of his home by his mother unresponsive. Over the 3 days prior to hospitalization patient had increasing cough with associated shortness of breath, weakness, minimal by mouth intake as well as diarrhea. On presentation was found to be febrile with a temperature of 105. Initial imaging studies included a chest x-ray which showed left lower lobe patchy airspace disease consistent with pneumonia. He  met sepsis criteria and was admitted to the step down unit. He was initially started on broad-spectrum empiric IV antimicrobial therapy with IV vancomycin and Zosyn. Initial CT scan of brain showed no acute intracranial abnormality. However given unresponsiveness he was seen and evaluated by neurology. There was concern that this could be a vascular insult for which MRI of brain was recommended. On 01/03/2013 MRI showed no acute intracranial abnormality. He also presented with acute renal failure having a creatinine of 3.93 and BUN of 54 on 01/01/2013. His renal failure was further worked up with bilateral kidney ultrasound that was done on 01/02/2013. This study revealed 9 cm masslike area of the left kidney which did not demonstrate significant internal blood flow. Was felt that this likely represented a neoplasm. This was further worked up with an MRI of abdomen without contrast performed on 01/03/2013 which demonstrated an 8.4 x 8.9 x 9.5 cm mass that extended laterally from the mid and lower pole of the left kidney. Urology was consulted as patient was evaluated by Dr.Wrenn, who recommended followup with renal surgeon for consideration of partial nephrectomy or radical nephrectomy. Dr. Jeffie Pollock reported on a followup note that's procedure could be done laparoscopically but that's this will be further  discussed at his office after discharge. He recommended that patient followup with him in 10-14 days after discharge for surgical planning. There were concerns by nephrology that patient could require hemodialysis in the near future. Nephrology recommended AV fistula placement. Initially patient was apprehensive about going this procedure. However he later changed his mind and on 01/18/2013 he underwent AV fistula placement by vascular surgery. Other issues during this hospitalization, he was found to have Legionella pneumonia as his IV antibiotics were eventually transitioned to oral Levaquin receiving a total of 10 days. From a respiratory standpoint he is stable, has remained afebrile, nontoxic, and overall much improved. He is presently off on antimicrobial therapy.  His hospitalization has been complicated by acute gout, for which she was started on prednisone. He stated that culture seen in the past has not been as effective as prednisone. He has been evaluated by physical therapy who has recommended skilled nursing facility placement. An order has been placed for social work consult for SNF placement.                                                                            Assessment/Plan:.  Acute hypoxic respiratory failure due to Legionella PNA  - Significantly improved.  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  - Dr.  Wrenn with Urology recommended outpt f/u with resection when more stable medically. Will require left nephrectomy.  Acute renal failure -Patient's kidney function continues to improve, creatinine down to 4.26 from 4.74 on yesterday's lab work. Status post AV fistula placement.   Acute gout - Continue oral prednisone 40 mg by mouth daily, patient reports feeling better.  Malignant HYPERTENSION  -Blood pressures are stable, with systolic blood pressures fluctuate between 130s to 140s.     Code Status: full Family Communication: Plan discussed with patient's family members present at bedside Disposition Plan: await SNF Consultants:  Urology  Nephrology  neurology  Procedures:  EEG  Antibiotics: Vanc/ Zosyn 10/25 >>> 10/26  Levaquin 10/27 >>>11/8  Zithromax 10/25 >>> 10/27  Rocephin 10/25 >>> 10/26   HPI/Subjective: Still with knee and bil foot pain- states better on prednisone.tolerating PO well. Denies cough and no SOB.  Objective: Filed Vitals:   01/19/13 2145 01/20/13 0514 01/20/13 1030 01/20/13 1332  BP: 155/86 136/84 142/64 147/93  Pulse: 98 89 78 93  Temp: 98.7 F (37.1 C) 98.5 F (36.9 C) 98.5 F (36.9 C) 98.6 F (37 C)  TempSrc: Oral Oral Oral Oral  Resp: 18 18 20 18   Height: 6\' 3"  (1.905 m)     Weight: 95.89 kg (211 lb 6.4 oz)     SpO2: 93% 94% 95% 95%    Intake/Output Summary (Last 24 hours) at 01/20/13 1440 Last data filed at 01/20/13 1008  Gross per 24 hour  Intake    480 ml  Output    852 ml  Net   -372 ml   Filed Weights   01/16/13 2201 01/18/13 2235 01/19/13 2145  Weight: 95.754 kg (211 lb 1.6 oz) 94.711 kg (208 lb 12.8 oz) 95.89 kg (211 lb 6.4 oz)    Exam:  General: Alert, awake, oriented x3, in no acute distress.  HEENT: No bruits, no goiter.  Heart: Regular rate and rhythm, without murmurs, rubs, gallops.  Lungs: Good air movement, clear to asucultation Abdomen: Soft, nontender, nondistended, positive bowel sounds.  Neuro: Grossly intact, nonfocal. Extremities: +Tenderness in knees, ankles, feet with decreased swelling over ankles  Data Reviewed: Basic Metabolic Panel:  Recent Labs Lab 01/15/13 0500 01/16/13 1220 01/17/13 0500 01/18/13 0443 01/19/13 0515 01/19/13 1825 01/20/13 0445  NA 140 137 137 134* 137  --  141  K 4.7 5.2* 5.0 5.6* 6.3* 4.9 4.7  CL 103 100 100 99 101  --  104  CO2 25 26 26 23 23   --  23  GLUCOSE 102* 126* 114* 141* 130*  --  131*  BUN 98* 91* 87* 90* 97*  --  91*  CREATININE  6.10* 5.58* 5.32* 4.74* 4.26*  --  3.65*  CALCIUM 9.3 9.3 9.4 9.6 9.5  --  9.4  PHOS 7.0*  --   --   --   --   --   --    Liver Function Tests:  Recent Labs Lab 01/15/13 0500  ALBUMIN 2.5*   No results found for this basename: LIPASE, AMYLASE,  in the last 168 hours No results found for this basename: AMMONIA,  in the last 168 hours CBC:  Recent Labs Lab 01/14/13 0456 01/20/13 0445  WBC 7.6 8.2  HGB 11.4* 10.9*  HCT 34.7* 33.5*  MCV 86.1 87.7  PLT 419* 247   Cardiac Enzymes: No results found for this basename: CKTOTAL, CKMB, CKMBINDEX, TROPONINI,  in the last 168 hours BNP (last 3 results) No results found for this  basename: PROBNP,  in the last 8760 hours CBG: No results found for this basename: GLUCAP,  in the last 168 hours  Recent Results (from the past 240 hour(s))  SURGICAL PCR SCREEN     Status: None   Collection Time    01/18/13 12:18 AM      Result Value Range Status   MRSA, PCR NEGATIVE  NEGATIVE Final   Staphylococcus aureus NEGATIVE  NEGATIVE Final   Comment:            The Xpert SA Assay (FDA     approved for NASAL specimens     in patients over 61 years of age),     is one component of     a comprehensive surveillance     program.  Test performance has     been validated by [employer] for patients greater     than or equal to 36 year old.     It is not intended     to diagnose infection nor to     guide or monitor treatment.     Studies: No results found.  Scheduled Meds: . antiseptic oral rinse  15 mL Mouth Rinse BID  . calcitRIOL  0.25 mcg Oral Daily  . calcium acetate  1,334 mg Oral TID WC  . cloNIDine  0.2 mg Oral TID  . heparin subcutaneous  5,000 Units Subcutaneous Q8H  . hydrALAZINE  50 mg Oral TID  . isosorbide dinitrate  5 mg Oral BID  . predniSONE  40 mg Oral Q breakfast  . sodium bicarbonate  650 mg Oral BID  . Vitamin D (Ergocalciferol)  50,000 Units Oral Q7 days   Continuous Infusions: . sodium chloride 20 mL/hr at  01/08/13 [facility] Pager [phone]. If 8PM-8AM, please contact night-coverage at [email or web address], password [facility] 01/20/2013, 2:40 PM  LOS: 19 days

## 2013-01-20 NOTE — Progress Notes (Signed)
Rehab admissions - I received a call from PT due to functional decline and issues related to gout flare up.  Patient reports gout in his knees which he feels is related to too much meat in his diet.  He feels the renal diet and diet to control gout are in conflict.  He lives alone.  He feels he will need prolonged recovery and will need SNF placement.  He is unfunded and he is worried about continued costs of being hospitalized.  Social worker is aware of patient preference for SNF at discharge.  Call me for questions.  RC:[phone]

## 2013-01-20 NOTE — Progress Notes (Signed)
Occupational Therapy Treatment Patient Details Name: Dellas Masiello MRN: [id number] DOB: April 27, 1955 Today's Date: 01/20/2013 Time: [id number] OT Time Calculation (min): 24 min  OT Assessment / Plan / Recommendation  History of present illness pt has had decline in functional status due to gout pain and immoblity in knees   OT comments  Pt reports 9/10 pain in B LEs and was unable top fully participate in bed mobility for sitting EOB for ADLs and was unable to perform any transfers. Pt reports that pain in LEs radiates from feet to knees ans increased with movement. Pt required increased time to complete tasks due to pain.  Pt plans to d/c to SNF for rehab after acute care d/c  Follow Up Recommendations  SNF;Supervision/Assistance - 24 hour    Barriers to Discharge   Pt lives at home alone    Equipment Recommendations  None recommended by OT;Other (comment) (TBD)    Recommendations for Other Services    Frequency Min 2X/week   Progress towards OT Goals Progress towards OT goals: OT to reassess next treatment  Plan      Precautions / Restrictions Precautions Precautions: Fall Restrictions Weight Bearing Restrictions: No   Pertinent Vitals/Pain 9/10 B LEs during movement    ADL  Grooming: Performed;Wash/dry hands;Wash/dry face;Set up Where Assessed - Grooming: Supine, head of bed up Upper Body Bathing: Supervision/safety;Set up;Simulated Where Assessed - Upper Body Bathing: Supine, head of bed up Lower Body Bathing: Simulated;Moderate assistance Where Assessed - Lower Body Bathing: Supine, head of bed up Lower Body Dressing: +1 Total assistance Transfers/Ambulation Related to ADLs: attempted x 3 to sit EOB for grroming and ADLs, however, pt reports 9/10 in B LEs during mobility and was uable to complete sup - sit ADL Comments: UB  ADLs  at bed level, total A to donn socks    OT Diagnosis:    OT Problem List:   OT Treatment Interventions:     OT Goals(current goals can now  be found in the care plan section)    Visit Information  Last OT Received On: 01/20/13 History of Present Illness: pt has had decline in functional status due to gout pain and immoblity in knees    Subjective Data      Prior Functioning       Cognition  Cognition Arousal/Alertness: Awake/alert Overall Cognitive Status: Within Functional Limits for tasks assessed    Mobility  Bed Mobility Bed Mobility: Rolling Right;Rolling Left Rolling Right: 3: Mod assist Rolling Left: 4: Min assist Transfers Transfers: Not assessed Details for Transfer Assistance: pt declined transfer to [facility] due to c/o B LE pain 9/10 during mobility    Exercises      Balance Balance Balance Assessed: No   End of Session OT - End of Session Activity Tolerance: Patient limited by pain Patient left: in bed;with call bell/phone within reach  GO     [name] 01/20/2013, 1:18 PM

## 2013-01-21 LAB — BASIC METABOLIC PANEL
BUN: 80 mg/dL — ABNORMAL HIGH (ref 6–23)
Calcium: 9.2 mg/dL (ref 8.4–10.5)
Creatinine, Ser: 2.98 mg/dL — ABNORMAL HIGH (ref 0.50–1.35)
GFR calc Af Amer: 25 mL/min — ABNORMAL LOW (ref >90)
GFR calc non Af Amer: 22 mL/min — ABNORMAL LOW (ref >90)

## 2013-01-21 MED ORDER — LABETALOL HCL 5 MG/ML IV SOLN
10.0000 mg | INTRAVENOUS | Status: DC | PRN
  Filled 2013-01-21: qty 4

## 2013-01-21 MED ORDER — PREDNISONE 20 MG PO TABS
20.0000 mg | ORAL_TABLET | Freq: Once | ORAL | Status: AC
  Administered 2013-01-21: 20 mg via ORAL
  Filled 2013-01-21: qty 1

## 2013-01-21 MED ORDER — HYDROMORPHONE HCL 2 MG PO TABS
2.0000 mg | ORAL_TABLET | Freq: Four times a day (QID) | ORAL | Status: DC | PRN
  Administered 2013-01-21: 2 mg via ORAL
  Administered 2013-01-21 – 2013-01-22 (×2): 4 mg via ORAL
  Administered 2013-01-22: 2 mg via ORAL
  Filled 2013-01-21 (×2): qty 2
  Filled 2013-01-21 (×2): qty 1
  Filled 2013-01-21: qty 2

## 2013-01-21 NOTE — Progress Notes (Signed)
PT Cancellation Note  Patient Details Name: Alhassane Sanson MRN: [id number] DOB: [birth year]   Cancelled Treatment:    Reason Eval/Treat Not Completed: Pain limiting ability to participate   Shanna Cisco 01/21/2013, 2:47 PM

## 2013-01-21 NOTE — Progress Notes (Signed)
[facility] PROGRESS NOT  Interim summary Patient is a pleasant 57 year old gentleman with a past medical history of hypertension and gout who presented to the emergency room on 01/02/2013, found on the floor of his home by his mother unresponsive. Over the 3 days prior to hospitalization patient had increasing cough with associated shortness of breath, weakness, minimal by mouth intake as well as diarrhea. On presentation was found to be febrile with a temperature of 105. Initial imaging studies included a chest x-ray which showed left lower lobe patchy airspace disease consistent with pneumonia. He  met sepsis criteria and was admitted to the step down unit. He was initially started on broad-spectrum empiric IV antimicrobial therapy with IV vancomycin and Zosyn. Initial CT scan of brain showed no acute intracranial abnormality. However given unresponsiveness he was seen and evaluated by neurology. There was concern that this could be a vascular insult for which MRI of brain was recommended. On 01/03/2013 MRI showed no acute intracranial abnormality. He also presented with acute renal failure having a creatinine of 3.93 and BUN of 54 on 01/01/2013. His renal failure was further worked up with bilateral kidney ultrasound that was done on 01/02/2013. This study revealed 9 cm masslike area of the left kidney which did not demonstrate significant internal blood flow. Was felt that this likely represented a neoplasm. This was further worked up with an MRI of abdomen without contrast performed on 01/03/2013 which demonstrated an 8.4 x 8.9 x 9.5 cm mass that extended laterally from the mid and lower pole of the left kidney. Urology was consulted as patient was evaluated by Dr.Wrenn, who recommended followup with renal surgeon for consideration of partial nephrectomy or radical nephrectomy. Dr. Jeffie Pollock reported on a followup note that's procedure could be done laparoscopically but that's this will be further  discussed at his office after discharge. He recommended that patient followup with him in 10-14 days after discharge for surgical planning. There were concerns by nephrology that patient could require hemodialysis in the near future. Nephrology recommended AV fistula placement. Initially patient was apprehensive about going this procedure. However he later changed his mind and on 01/18/2013 he underwent AV fistula placement by vascular surgery. Other issues during this hospitalization, he was found to have Legionella pneumonia as his IV antibiotics were eventually transitioned to oral Levaquin receiving a total of 10 days. From a respiratory standpoint he is stable, has remained afebrile, nontoxic, and overall much improved. He is presently off on antimicrobial therapy.  His hospitalization has been complicated by acute gout, for which she was started on prednisone. He stated that culture seen in the past has not been as effective as prednisone. He has been evaluated by physical therapy who has recommended skilled nursing facility placement. social work had been consulted and following for SNF placement.                                                                            Assessment/Plan:.  Acute hypoxic respiratory failure due to Legionella PNA  - Significantly improved.  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  - Dr. Jeffie Pollock with  Urology recommended outpt f/u with resection when more stable medically. Will require left nephrectomy.  Acute renal failure -Patient's kidney function continues to improve, creatinine down to 4.26 from 4.74 on yesterday's lab work. Status post AV fistula placement.   Acute gout - Continue oral prednisone by mouth daily, patient c/o of increased pain today>> will give extra dose of prednisone and change oxycodone to po dilaudid( he states oxycodone does not help)  Malignant  HYPERTENSION  -Blood pressures trending up>> likely d/t pain and steroids -adjusting meds for pain control as above, continnue iv labetalol(dose increased today 11/13) prn in addition to scheduled meds, follow    Code Status: full Family Communication: none at bedside Disposition Plan: await SNF([facility] d/ct pt with no insurance) Consultants:  Urology  Nephrology  neurology  Procedures:  EEG  Antibiotics: Vanc/ Zosyn 10/25 >>> 10/26  Levaquin 10/27 >>>11/8  Zithromax 10/25 >>> 10/27  Rocephin 10/25 >>> 10/26   HPI/Subjective: C/o gout pain, denies any n/v, no cough or sob.  Objective: Filed Vitals:   01/20/13 1745 01/20/13 2045 01/21/13 0549 01/21/13 0901  BP: 145/83 167/91 164/87 186/97  Pulse: 97 92 79 91  Temp: 99 F (37.2 C) 98.8 F (37.1 C) 98.7 F (37.1 C)   TempSrc: Axillary Oral Oral   Resp: 18 18 18    Height:  6\' 3"  (1.905 m)    Weight:  95.754 kg (211 lb 1.6 oz)    SpO2: 95% 95% 94%     Intake/Output Summary (Last 24 hours) at 01/21/13 1209 Last data filed at 01/21/13 0911  Gross per 24 hour  Intake    600 ml  Output   1900 ml  Net  -1300 ml   Filed Weights   01/18/13 2235 01/19/13 2145 01/20/13 2045  Weight: 94.711 kg (208 lb 12.8 oz) 95.89 kg (211 lb 6.4 oz) 95.754 kg (211 lb 1.6 oz)    Exam:  General: Alert, awake, oriented x3, in no acute distress.  HEENT: No bruits, no goiter.  Heart: Regular rate and rhythm, without murmurs, rubs, gallops.  Lungs: Good air movement, clear to asucultation Abdomen: Soft, nontender, nondistended, positive bowel sounds.  Neuro: Grossly intact, nonfocal. Extremities: +Tenderness in knees, ankles, feet with decreased swelling over ankles  Data Reviewed: Basic Metabolic Panel:  Recent Labs Lab 01/15/13 0500  01/17/13 0500 01/18/13 0443 01/19/13 0515 01/19/13 1825 01/20/13 0445 01/21/13 0547  NA 140  < > 137 134* 137  --  141 141  K 4.7  < > 5.0 5.6* 6.3* 4.9 4.7 4.5  CL 103  < > 100 99 101  --   104 105  CO2 25  < > 26 23 23   --  23 25  GLUCOSE 102*  < > 114* 141* 130*  --  131* 99  BUN 98*  < > 87* 90* 97*  --  91* 80*  CREATININE 6.10*  < > 5.32* 4.74* 4.26*  --  3.65* 2.98*  CALCIUM 9.3  < > 9.4 9.6 9.5  --  9.4 9.2  PHOS 7.0*  --   --   --   --   --   --   --   < > = values in this interval not displayed. Liver Function Tests:  Recent Labs Lab 01/15/13 0500  ALBUMIN 2.5*   No results found for this basename: LIPASE, AMYLASE,  in the last 168 hours No results found for this basename: AMMONIA,  in the last 168 hours CBC:  Recent Labs  Lab 01/20/13 0445  WBC 8.2  HGB 10.9*  HCT 33.5*  MCV 87.7  PLT 247   Cardiac Enzymes: No results found for this basename: CKTOTAL, CKMB, CKMBINDEX, TROPONINI,  in the last 168 hours BNP (last 3 results) No results found for this basename: PROBNP,  in the last 8760 hours CBG: No results found for this basename: GLUCAP,  in the last 168 hours  Recent Results (from the past 240 hour(s))  SURGICAL PCR SCREEN     Status: None   Collection Time    01/18/13 12:18 AM      Result Value Range Status   MRSA, PCR NEGATIVE  NEGATIVE Final   Staphylococcus aureus NEGATIVE  NEGATIVE Final   Comment:            The Xpert SA Assay (FDA     approved for NASAL specimens     in patients over 57 years of age),     is one component of     a comprehensive surveillance     program.  Test performance has     been validated by [employer] for patients greater     than or equal to 10 year old.     It is not intended     to diagnose infection nor to     guide or monitor treatment.     Studies: No results found.  Scheduled Meds: . antiseptic oral rinse  15 mL Mouth Rinse BID  . calcitRIOL  0.25 mcg Oral Daily  . calcium acetate  1,334 mg Oral TID WC  . cloNIDine  0.2 mg Oral TID  . heparin subcutaneous  5,000 Units Subcutaneous Q8H  . hydrALAZINE  50 mg Oral TID  . isosorbide dinitrate  5 mg Oral BID  . predniSONE  20 mg Oral Once   . predniSONE  40 mg Oral Q breakfast  . sodium bicarbonate  650 mg Oral BID  . Vitamin D (Ergocalciferol)  50,000 Units Oral Q7 days   Continuous Infusions: . sodium chloride 20 mL/hr at 01/08/13 [facility] Pager [phone]. If 8PM-8AM, please contact night-coverage at [email or web address], password [facility] 01/21/2013, 12:09 PM  LOS: 20 days

## 2013-01-22 LAB — BASIC METABOLIC PANEL
CO2: 25 mEq/L (ref 19–32)
Calcium: 9.1 mg/dL (ref 8.4–10.5)
Creatinine, Ser: 2.76 mg/dL — ABNORMAL HIGH (ref 0.50–1.35)
GFR calc Af Amer: 28 mL/min — ABNORMAL LOW (ref >90)
Potassium: 5.2 mEq/L — ABNORMAL HIGH (ref 3.5–5.1)
Sodium: 140 mEq/L (ref 135–145)

## 2013-01-22 NOTE — Progress Notes (Signed)
Physical Therapy Treatment Patient Details Name: Brian Lawson MRN: [id number] DOB: [birth year] Today's Date: 01/22/2013 Time: [id number] PT Time Calculation (min): 24 min  PT Assessment / Plan / Recommendation  History of Present Illness pt has had decline in functional status due to gout pain and immoblity in knees   PT Comments   Pt continues to be limited in mobility secondary to pain. Was highly motivated to increase mobility today and get into chair. Pt required 2+ (A) to achieve upright standing position but was able to perform pivot transfer with mod (A). [facility] to increase independence in functional mobility prior to returning home.   Follow Up Recommendations  SNF;Supervision for mobility/OOB;Supervision/Assistance - 24 hour     Does the patient have the potential to tolerate intense rehabilitation     Barriers to Discharge        Equipment Recommendations  Rolling walker with 5" wheels    Recommendations for Other Services    Frequency Min 3X/week   Progress towards PT Goals Progress towards PT goals: Progressing toward goals  Plan Discharge plan needs to be updated    Precautions / Restrictions Precautions Precautions: Fall Restrictions Weight Bearing Restrictions: No   Pertinent Vitals/Pain C/o 10/10 pain in bil LEs with movement; RN notified.; pt also nauseous sitting in chair     Mobility  Bed Mobility Bed Mobility: Supine to Sit;Sitting - Scoot to Edge of Bed Supine to Sit: HOB flat;With rails;4: Min assist Sitting - Scoot to [employer] of Bed: 4: Min assist;With rail Details for Bed Mobility Assistance: (A) to advance LEs to/off EOB; pt able to advance LEs independently minimally but unable to complete transition to EOB; pt requires incr time and cues for hand placement and sequencing  Transfers Transfers: Sit to Stand;Stand to Sit;Stand Pivot Transfers Sit to Stand: 1: +2 Total assist;From bed;With upper extremity assist;From elevated  surface Sit to Stand: Patient Percentage: 60% Stand to Sit: 3: Mod assist;To elevated surface;With armrests;With upper extremity assist;To chair/3-in-1 Stand Pivot Transfers: 3: Mod assist;From elevated surface Details for Transfer Assistance: pt requires 2 + (A) to acheive upright standing position; one person to block Lt LE to prevent bukcling and the other to steady and provide (A) to reach full upright standing position; pt required mod (A) to pivot to chair; (A) to manage RW and steady pt during transfer; max cues for sequencing and management of RW  Ambulation/Gait Ambulation/Gait Assistance: Not tested (comment) (pt limited due to pain ) Stairs: No Wheelchair Mobility Wheelchair Mobility: No    Exercises General Exercises - Lower Extremity Long Arc Quad: AROM;Both;10 reps;Seated;Other (comment) (cues to slow down and control contraction ) Heel Slides: AROM;Both;10 reps;Seated Hip Flexion/Marching: AROM;Both;10 reps;Seated Other Exercises Other Exercises: pt began to have increased ROM with more reps; pt stated "it feels better when i move my knees"    PT Diagnosis:    PT Problem List:   PT Treatment Interventions:     PT Goals (current goals can now be found in the care plan section) Acute Rehab PT Goals Patient Stated Goal: to not have pain in Lt knee  PT Goal Formulation: With patient Time For Goal Achievement: 01/22/13 Potential to Achieve Goals: Good  Visit Information  Last PT Received On: 01/22/13 Assistance Needed: +2 History of Present Illness: pt has had decline in functional status due to gout pain and immoblity in knees    Subjective Data  Subjective: pt lying supine; agreeable to therapy. " i havent been  up in a few days and i know i need to so i dont lose it"  Patient Stated Goal: to not have pain in Lt knee    Cognition  Cognition Arousal/Alertness: Awake/alert Behavior During Therapy: WFL for tasks assessed/performed Overall Cognitive Status: Within  Functional Limits for tasks assessed    Balance  Balance Balance Assessed: Yes Static Sitting Balance Static Sitting - Balance Support: No upper extremity supported;Feet supported Static Sitting - Level of Assistance: 7: Independent Static Sitting - Comment/# of Minutes: pt tolerated sitting EOB ~8 min for exercises and to prepare for transfer   End of Session PT - End of Session Equipment Utilized During Treatment: Gait belt Activity Tolerance: Patient limited by pain Patient left: in chair;with call bell/phone within reach Nurse Communication: Mobility status;Need for lift equipment;Patient requests pain meds   GP     Gustavus Bryant, [place] 01/22/2013, 12:01 PM

## 2013-01-22 NOTE — Progress Notes (Signed)
[facility] PROGRESS NOT  Interim summary Patient is a pleasant 57 year old gentleman with a past medical history of hypertension and gout who presented to the emergency room on 01/02/2013, found on the floor of his home by his mother unresponsive. Over the 3 days prior to hospitalization patient had increasing cough with associated shortness of breath, weakness, minimal by mouth intake as well as diarrhea. On presentation was found to be febrile with a temperature of 105. Initial imaging studies included a chest x-ray which showed left lower lobe patchy airspace disease consistent with pneumonia. He  met sepsis criteria and was admitted to the step down unit. He was initially started on broad-spectrum empiric IV antimicrobial therapy with IV vancomycin and Zosyn. Initial CT scan of brain showed no acute intracranial abnormality. However given unresponsiveness he was seen and evaluated by neurology. There was concern that this could be a vascular insult for which MRI of brain was recommended. On 01/03/2013 MRI showed no acute intracranial abnormality. He also presented with acute renal failure having a creatinine of 3.93 and BUN of 54 on 01/01/2013. His renal failure was further worked up with bilateral kidney ultrasound that was done on 01/02/2013. This study revealed 9 cm masslike area of the left kidney which did not demonstrate significant internal blood flow. Was felt that this likely represented a neoplasm. This was further worked up with an MRI of abdomen without contrast performed on 01/03/2013 which demonstrated an 8.4 x 8.9 x 9.5 cm mass that extended laterally from the mid and lower pole of the left kidney. Urology was consulted as patient was evaluated by Dr.Wrenn, who recommended followup with renal surgeon for consideration of partial nephrectomy or radical nephrectomy. Dr. Jeffie Pollock reported on a followup note that's procedure could be done laparoscopically but that's this will be further  discussed at his office after discharge. He recommended that patient followup with him in 10-14 days after discharge for surgical planning. There were concerns by nephrology that patient could require hemodialysis in the near future. Nephrology recommended AV fistula placement. Initially patient was apprehensive about going this procedure. However he later changed his mind and on 01/18/2013 he underwent AV fistula placement by vascular surgery. Other issues during this hospitalization, he was found to have Legionella pneumonia as his IV antibiotics were eventually transitioned to oral Levaquin receiving a total of 10 days. From a respiratory standpoint he is stable, has remained afebrile, nontoxic, and overall much improved. He is presently off on antimicrobial therapy.  His hospitalization has been complicated by acute gout, for which she was started on prednisone. He stated that culture seen in the past has not been as effective as prednisone. He has been evaluated by physical therapy who has recommended skilled nursing facility placement. social work had been consulted and following for SNF placement.                                                                            Assessment/Plan:.  Acute hypoxic respiratory failure due to Legionella PNA  - Significantly improved.  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  - Dr. Jeffie Pollock with  Urology recommended outpt f/u with resection when more stable medically. Will require left nephrectomy.  Acute renal failure -Patient's kidney function continues to improve, creatinine down to 4.26 from 4.74 on yesterday's lab work. Status post AV fistula placement.   Acute gout - Continue oral prednisone by mouth daily,  - on 11/13 received extra dose of prednisone and changed oxycodone to po dilaudid( he states oxycodone does not help)>> and today states feeling better -continue  prednisone and prn dilaudid  Malignant HYPERTENSION  -continue BP monitoring and  Current meds>> pain and steroids likely contributing to suboptimal control -continue pain control as above, continue iv labetalol(dose increased 11/13) prn in addition to scheduled meds, follow    Code Status: full Family Communication: none at bedside Disposition Plan: await SNF([facility] d/ct pt with no insurance) Consultants:  Urology  Nephrology  neurology  Procedures:  EEG  Antibiotics: Vanc/ Zosyn 10/25 >>> 10/26  Levaquin 10/27 >>>11/8  Zithromax 10/25 >>> 10/27  Rocephin 10/25 >>> 10/26   HPI/Subjective: States pain better controlled today.  Objective: Filed Vitals:   01/21/13 1800 01/21/13 2213 01/22/13 0556 01/22/13 0910  BP: 157/93 164/92 148/88 160/91  Pulse: 95 94 75 78  Temp: 98.9 F (37.2 C) 98.5 F (36.9 C) 98.7 F (37.1 C) 98.4 F (36.9 C)  TempSrc: Oral Oral Oral Oral  Resp:  18 18 15   Height:      Weight:      SpO2: 96% 94% 95% 98%    Intake/Output Summary (Last 24 hours) at 01/22/13 1309 Last data filed at 01/22/13 0900  Gross per 24 hour  Intake    270 ml  Output   1300 ml  Net  -1030 ml   Filed Weights   01/18/13 2235 01/19/13 2145 01/20/13 2045  Weight: 94.711 kg (208 lb 12.8 oz) 95.89 kg (211 lb 6.4 oz) 95.754 kg (211 lb 1.6 oz)    Exam:  General: Alert, awake, oriented x3, in no acute distress.  HEENT: No bruits, no goiter.  Heart: Regular rate and rhythm, without murmurs, rubs, gallops.  Lungs: Good air movement, clear to asucultation Abdomen: Soft, nontender, nondistended, positive bowel sounds.  Neuro: Grossly intact, nonfocal. Extremities: +Tenderness in knees, ankles, feet with decreased swelling over ankles  Data Reviewed: Basic Metabolic Panel:  Recent Labs Lab 01/18/13 0443 01/19/13 0515 01/19/13 1825 01/20/13 0445 01/21/13 0547 01/22/13 0555  NA 134* 137  --  141 141 140  K 5.6* 6.3* 4.9 4.7 4.5 5.2*  CL 99 101  --  104 105  104  CO2 23 23  --  23 25 25   GLUCOSE 141* 130*  --  131* 99 121*  BUN 90* 97*  --  91* 80* 73*  CREATININE 4.74* 4.26*  --  3.65* 2.98* 2.76*  CALCIUM 9.6 9.5  --  9.4 9.2 9.1   Liver Function Tests: No results found for this basename: AST, ALT, ALKPHOS, BILITOT, PROT, ALBUMIN,  in the last 168 hours No results found for this basename: LIPASE, AMYLASE,  in the last 168 hours No results found for this basename: AMMONIA,  in the last 168 hours CBC:  Recent Labs Lab 01/20/13 0445  WBC 8.2  HGB 10.9*  HCT 33.5*  MCV 87.7  PLT 247   Cardiac Enzymes: No results found for this basename: CKTOTAL, CKMB, CKMBINDEX, TROPONINI,  in the last 168 hours BNP (last 3 results) No results found for this basename: PROBNP,  in the last 8760 hours CBG: No results found for  this basename: GLUCAP,  in the last 168 hours  Recent Results (from the past 240 hour(s))  SURGICAL PCR SCREEN     Status: None   Collection Time    01/18/13 12:18 AM      Result Value Range Status   MRSA, PCR NEGATIVE  NEGATIVE Final   Staphylococcus aureus NEGATIVE  NEGATIVE Final   Comment:            The Xpert SA Assay (FDA     approved for NASAL specimens     in patients over 19 years of age),     is one component of     a comprehensive surveillance     program.  Test performance has     been validated by [employer] for patients greater     than or equal to 58 year old.     It is not intended     to diagnose infection nor to     guide or monitor treatment.     Studies: No results found.  Scheduled Meds: . antiseptic oral rinse  15 mL Mouth Rinse BID  . calcitRIOL  0.25 mcg Oral Daily  . calcium acetate  1,334 mg Oral TID WC  . cloNIDine  0.2 mg Oral TID  . heparin subcutaneous  5,000 Units Subcutaneous Q8H  . hydrALAZINE  50 mg Oral TID  . isosorbide dinitrate  5 mg Oral BID  . predniSONE  40 mg Oral Q breakfast  . sodium bicarbonate  650 mg Oral BID  . Vitamin D (Ergocalciferol)  50,000 Units  Oral Q7 days   Continuous Infusions: . sodium chloride 20 mL/hr at 01/08/13 [facility] Pager [phone]. If 8PM-8AM, please contact night-coverage at [email or web address], password [facility] 01/22/2013, 1:09 PM  LOS: 21 days

## 2013-01-22 NOTE — Clinical Social Work Placement (Addendum)
Clinical Social Work Department CLINICAL SOCIAL WORK PLACEMENT NOTE 01/22/2013  Patient:  Brian Lawson, Brian Lawson  Account Number:  [id number] Admit date:  01/01/2013  Clinical Social Worker:  Machelle Raybon Givens, LCSW  Date/time:  01/22/2013 10:40 AM  Clinical Social Work is seeking post-discharge placement for this patient at the following level of care:   [place]   (*CSW will update this form in Epic as items are completed)   01/19/2013  Patient/family provided with [place] Department of Clinical Social Work's list of facilities offering this level of care within the geographic area requested by the patient (or if unable, by the patient's family).  01/19/2013  Patient/family informed of their freedom to choose among providers that offer the needed level of care, that participate in Medicare, Medicaid or managed care program needed by the patient, have an available bed and are willing to accept the patient.    Patient/family informed of [facility]' ownership interest in [facility], as well as of the fact that they are under no obligation to receive care at this facility.  PASARR submitted to EDS on 01/19/2013 PASARR number received from EDS on 01/19/2013  FL2 transmitted to all facilities in geographic area requested by pt/family on  01/19/2013 FL2 transmitted to all facilities within larger geographic area on   Patient informed that his/her managed care company has contracts with or will negotiate with  certain facilities, including the following:     Patient/family informed of bed offers received: 01/22/13   Offer received from [facility] for a bed for patient on Monday, November 17th.  Physician recommends and patient chooses bed at    Patient to be transferred to [facility] on 01/25/13 Patient to be transferred to facility by ambulance  The following physician request were entered in Epic:  Additional Comments: 01/22/13 - Patient is a "Difficult to Place due  to no payor source. Patient will discharge to [facility] on 11/17 or [facility]. 01/22/13 - CSW talked with admissions director for [facility]. They can take patient on Monday, 11/17. CSW talked with admissions director for [facility] and they currently have no beds or any planned discharges through the end of November.

## 2013-01-23 MED ORDER — INFLUENZA VAC SPLIT QUAD 0.5 ML IM SUSP
0.5000 mL | INTRAMUSCULAR | Status: AC
  Administered 2013-01-24: 0.5 mL via INTRAMUSCULAR
  Filled 2013-01-23: qty 0.5

## 2013-01-23 MED ORDER — HYDROMORPHONE HCL 2 MG PO TABS
1.0000 mg | ORAL_TABLET | Freq: Four times a day (QID) | ORAL | Status: DC | PRN
  Administered 2013-01-23: 1 mg via ORAL
  Administered 2013-01-25: 2 mg via ORAL
  Filled 2013-01-23 (×3): qty 1

## 2013-01-23 MED ORDER — PNEUMOCOCCAL VAC POLYVALENT 25 MCG/0.5ML IJ INJ
0.5000 mL | INJECTION | INTRAMUSCULAR | Status: AC
  Administered 2013-01-24: 0.5 mL via INTRAMUSCULAR
  Filled 2013-01-23: qty 0.5

## 2013-01-23 NOTE — Progress Notes (Signed)
[facility] PROGRESS NOT  Interim summary Patient is a pleasant 57 year old gentleman with a past medical history of hypertension and gout who presented to the emergency room on 01/02/2013, found on the floor of his home by his mother unresponsive. Over the 3 days prior to hospitalization patient had increasing cough with associated shortness of breath, weakness, minimal by mouth intake as well as diarrhea. On presentation was found to be febrile with a temperature of 105. Initial imaging studies included a chest x-ray which showed left lower lobe patchy airspace disease consistent with pneumonia. He  met sepsis criteria and was admitted to the step down unit. He was initially started on broad-spectrum empiric IV antimicrobial therapy with IV vancomycin and Zosyn. Initial CT scan of brain showed no acute intracranial abnormality. However given unresponsiveness he was seen and evaluated by neurology. There was concern that this could be a vascular insult for which MRI of brain was recommended. On 01/03/2013 MRI showed no acute intracranial abnormality. He also presented with acute renal failure having a creatinine of 3.93 and BUN of 54 on 01/01/2013. His renal failure was further worked up with bilateral kidney ultrasound that was done on 01/02/2013. This study revealed 9 cm masslike area of the left kidney which did not demonstrate significant internal blood flow. Was felt that this likely represented a neoplasm. This was further worked up with an MRI of abdomen without contrast performed on 01/03/2013 which demonstrated an 8.4 x 8.9 x 9.5 cm mass that extended laterally from the mid and lower pole of the left kidney. Urology was consulted as patient was evaluated by Dr.Wrenn, who recommended followup with renal surgeon for consideration of partial nephrectomy or radical nephrectomy. Dr. Jeffie Pollock reported on a followup note that's procedure could be done laparoscopically but that's this will be further  discussed at his office after discharge. He recommended that patient followup with him in 10-14 days after discharge for surgical planning. There were concerns by nephrology that patient could require hemodialysis in the near future. Nephrology recommended AV fistula placement. Initially patient was apprehensive about going this procedure. However he later changed his mind and on 01/18/2013 he underwent AV fistula placement by vascular surgery. Other issues during this hospitalization, he was found to have Legionella pneumonia as his IV antibiotics were eventually transitioned to oral Levaquin receiving a total of 10 days. From a respiratory standpoint he is stable, has remained afebrile, nontoxic, and overall much improved. He is presently off on antimicrobial therapy.  His hospitalization has been complicated by acute gout, for which she was started on prednisone. He stated that culture seen in the past has not been as effective as prednisone. He has been evaluated by physical therapy who has recommended skilled nursing facility placement. social work had been consulted and following for SNF placement.                                                                            Assessment/Plan:.  Acute hypoxic respiratory failure due to Legionella PNA  - Significantly improved.  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  - Dr. Jeffie Pollock with  Urology recommended outpt f/u with resection when more stable medically. Will require left nephrectomy.  Acute renal failure -Patient's kidney function continues to improve, creatinine down to 4.26 from 4.74 on yesterday's lab work. Status post AV fistula placement.   Acute gout - Continue oral prednisone by mouth daily,  - on 11/13 received extra dose of prednisone and changed oxycodone to po dilaudid( he states oxycodone does not help)>> and today states feeling better -continue  prednisone and prn dilaudid-dose decreased today per pt's request -gradually improving, follow Malignant HYPERTENSION  -BP control improving with better pain control  -pain and steroids were likely contributing to suboptimal control -continue pain control as above, continue iv labetalol(dose increased 11/13) prn in addition to scheduled meds, follow    Code Status: full Family Communication: none at bedside Disposition Plan: await SNF(delay d/t pt with no insurance) Consultants:  Urology  Nephrology  neurology  Procedures:  EEG  Antibiotics: Vanc/ Zosyn 10/25 >>> 10/26  Levaquin 10/27 >>>11/8  Zithromax 10/25 >>> 10/27  Rocephin 10/25 >>> 10/26   HPI/Subjective: Feels better today, denies any new c/o.  Objective: Filed Vitals:   01/23/13 0512 01/23/13 0946 01/23/13 1342 01/23/13 1626  BP: 123/84 155/85 149/69 149/77  Pulse: 75 75 88   Temp: 99 F (37.2 C) 98.8 F (37.1 C) 98.8 F (37.1 C)   TempSrc: Oral     Resp: 18 17 16    Height:      Weight:      SpO2: 96% 97% 97%     Intake/Output Summary (Last 24 hours) at 01/23/13 1828 Last data filed at 01/23/13 1521  Gross per 24 hour  Intake    330 ml  Output   1825 ml  Net  -1495 ml   Filed Weights   01/18/13 2235 01/19/13 2145 01/20/13 2045  Weight: 94.711 kg (208 lb 12.8 oz) 95.89 kg (211 lb 6.4 oz) 95.754 kg (211 lb 1.6 oz)    Exam:  General: Alert, awake, oriented x3, in no acute distress.  HEENT: No bruits, no goiter.  Heart: Regular rate and rhythm, without murmurs, rubs, gallops.  Lungs: Good air movement, clear to asucultation Abdomen: Soft, nontender, nondistended, positive bowel sounds.  Neuro: Grossly intact, nonfocal. Extremities: +Tenderness in L. knees,  decreased swelling over ankles and feet  Data Reviewed: Basic Metabolic Panel:  Recent Labs Lab 01/18/13 0443 01/19/13 0515 01/19/13 1825 01/20/13 0445 01/21/13 0547 01/22/13 0555  NA 134* 137  --  141 141 140  K 5.6* 6.3* 4.9  4.7 4.5 5.2*  CL 99 101  --  104 105 104  CO2 23 23  --  23 25 25   GLUCOSE 141* 130*  --  131* 99 121*  BUN 90* 97*  --  91* 80* 73*  CREATININE 4.74* 4.26*  --  3.65* 2.98* 2.76*  CALCIUM 9.6 9.5  --  9.4 9.2 9.1   Liver Function Tests: No results found for this basename: AST, ALT, ALKPHOS, BILITOT, PROT, ALBUMIN,  in the last 168 hours No results found for this basename: LIPASE, AMYLASE,  in the last 168 hours No results found for this basename: AMMONIA,  in the last 168 hours CBC:  Recent Labs Lab 01/20/13 0445  WBC 8.2  HGB 10.9*  HCT 33.5*  MCV 87.7  PLT 247   Cardiac Enzymes: No results found for this basename: CKTOTAL, CKMB, CKMBINDEX, TROPONINI,  in the last 168 hours BNP (last 3 results) No results found for this basename: PROBNP,  in  the last 8760 hours CBG: No results found for this basename: GLUCAP,  in the last 168 hours  Recent Results (from the past 240 hour(s))  SURGICAL PCR SCREEN     Status: None   Collection Time    01/18/13 12:18 AM      Result Value Range Status   MRSA, PCR NEGATIVE  NEGATIVE Final   Staphylococcus aureus NEGATIVE  NEGATIVE Final   Comment:            The Xpert SA Assay (FDA     approved for NASAL specimens     in patients over 20 years of age),     is one component of     a comprehensive surveillance     program.  Test performance has     been validated by [employer] for patients greater     than or equal to 49 year old.     It is not intended     to diagnose infection nor to     guide or monitor treatment.     Studies: No results found.  Scheduled Meds: . antiseptic oral rinse  15 mL Mouth Rinse BID  . calcitRIOL  0.25 mcg Oral Daily  . calcium acetate  1,334 mg Oral TID WC  . cloNIDine  0.2 mg Oral TID  . heparin subcutaneous  5,000 Units Subcutaneous Q8H  . hydrALAZINE  50 mg Oral TID  . [START ON 01/24/2013] influenza vac split quadrivalent PF  0.5 mL Intramuscular Tomorrow-1000  . isosorbide dinitrate  5  mg Oral BID  . [START ON 01/24/2013] pneumococcal 23 valent vaccine  0.5 mL Intramuscular Tomorrow-1000  . predniSONE  40 mg Oral Q breakfast  . sodium bicarbonate  650 mg Oral BID  . Vitamin D (Ergocalciferol)  50,000 Units Oral Q7 days   Continuous Infusions: . sodium chloride 20 mL/hr at 01/08/13 [facility] Pager [phone]. If 8PM-8AM, please contact night-coverage at [email or web address], password [facility] 01/23/2013, 6:28 PM  LOS: 22 days

## 2013-01-24 NOTE — Progress Notes (Signed)
[facility] PROGRESS NOT  Interim summary Patient is a pleasant 57 year old gentleman with a past medical history of hypertension and gout who presented to the emergency room on 01/02/2013, found on the floor of his home by his mother unresponsive. Over the 3 days prior to hospitalization patient had increasing cough with associated shortness of breath, weakness, minimal by mouth intake as well as diarrhea. On presentation was found to be febrile with a temperature of 105. Initial imaging studies included a chest x-ray which showed left lower lobe patchy airspace disease consistent with pneumonia. He  met sepsis criteria and was admitted to the step down unit. He was initially started on broad-spectrum empiric IV antimicrobial therapy with IV vancomycin and Zosyn. Initial CT scan of brain showed no acute intracranial abnormality. However given unresponsiveness he was seen and evaluated by neurology. There was concern that this could be a vascular insult for which MRI of brain was recommended. On 01/03/2013 MRI showed no acute intracranial abnormality. He also presented with acute renal failure having a creatinine of 3.93 and BUN of 54 on 01/01/2013. His renal failure was further worked up with bilateral kidney ultrasound that was done on 01/02/2013. This study revealed 9 cm masslike area of the left kidney which did not demonstrate significant internal blood flow. Was felt that this likely represented a neoplasm. This was further worked up with an MRI of abdomen without contrast performed on 01/03/2013 which demonstrated an 8.4 x 8.9 x 9.5 cm mass that extended laterally from the mid and lower pole of the left kidney. Urology was consulted as patient was evaluated by Dr.Wrenn, who recommended followup with renal surgeon for consideration of partial nephrectomy or radical nephrectomy. Dr. Jeffie Pollock reported on a followup note that's procedure could be done laparoscopically but that's this will be further  discussed at his office after discharge. He recommended that patient followup with him in 10-14 days after discharge for surgical planning. There were concerns by nephrology that patient could require hemodialysis in the near future. Nephrology recommended AV fistula placement. Initially patient was apprehensive about going this procedure. However he later changed his mind and on 01/18/2013 he underwent AV fistula placement by vascular surgery. Other issues during this hospitalization, he was found to have Legionella pneumonia as his IV antibiotics were eventually transitioned to oral Levaquin receiving a total of 10 days. From a respiratory standpoint he is stable, has remained afebrile, nontoxic, and overall much improved. He is presently off on antimicrobial therapy.  His hospitalization has been complicated by acute gout, for which she was started on prednisone. He stated that culture seen in the past has not been as effective as prednisone. He has been evaluated by physical therapy who has recommended skilled nursing facility placement. social work had been consulted and following for SNF placement.                                                                            Assessment/Plan:.  Acute hypoxic respiratory failure due to Legionella PNA  - Significantly improved.  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  - Dr. Jeffie Pollock with  Urology recommended outpt f/u with resection when more stable medically. Will require left nephrectomy.  Acute renal failure -Patient's kidney function continues to improve, creatinine down to 4.26 from 4.74 on yesterday's lab work. Status post AV fistula placement.   Acute gout - Continue oral prednisone by mouth daily,  - on 11/13 received extra dose of prednisone and changed oxycodone to po dilaudid( he states oxycodone does not help)>> and today states feeling better -continue  prednisone and prn dilaudid-dose decreased 11/15 per pt's request -gradually improving, follow Malignant HYPERTENSION  -BP control improving with better pain control  -pain and steroids were likely contributing to suboptimal control -continue pain control as above, continue iv labetalol(dose increased 11/13) prn in addition to scheduled meds, follow    Code Status: full Family Communication: none at bedside Disposition Plan: await SNF(delay d/t pt with no insurance) Consultants:  Urology  Nephrology  neurology  Procedures:  EEG  Antibiotics: Vanc/ Zosyn 10/25 >>> 10/26  Levaquin 10/27 >>>11/8  Zithromax 10/25 >>> 10/27  Rocephin 10/25 >>> 10/26   HPI/Subjective:  denies any new c/o, awaiting SNF in a.m..  Objective: Filed Vitals:   01/23/13 2152 01/24/13 0506 01/24/13 1013 01/24/13 1351  BP: 120/86 132/80 141/90 154/86  Pulse: 85 72 79 94  Temp: 97.7 F (36.5 C) 98.1 F (36.7 C) 98.6 F (37 C)   TempSrc: Oral Oral Oral   Resp: 18 18 20 20   Height: 6\' 3"  (1.905 m)     Weight: 95.754 kg (211 lb 1.6 oz)     SpO2: 96% 98% 98% 97%    Intake/Output Summary (Last 24 hours) at 01/24/13 1514 Last data filed at 01/24/13 1300  Gross per 24 hour  Intake    720 ml  Output   2525 ml  Net  -1805 ml   Filed Weights   01/19/13 2145 01/20/13 2045 01/23/13 2152  Weight: 95.89 kg (211 lb 6.4 oz) 95.754 kg (211 lb 1.6 oz) 95.754 kg (211 lb 1.6 oz)    Exam:  General: Alert, awake, oriented x3, in no acute distress.  HEENT: No bruits, no goiter.  Heart: Regular rate and rhythm, without murmurs, rubs, gallops.  Lungs: Good air movement, clear to asucultation Abdomen: Soft, nontender, nondistended, positive bowel sounds.  Neuro: Grossly intact, nonfocal. Extremities: +Tenderness in L. knees,  decreased swelling over ankles and feet  Data Reviewed: Basic Metabolic Panel:  Recent Labs Lab 01/18/13 0443 01/19/13 0515 01/19/13 1825 01/20/13 0445 01/21/13 0547  01/22/13 0555  NA 134* 137  --  141 141 140  K 5.6* 6.3* 4.9 4.7 4.5 5.2*  CL 99 101  --  104 105 104  CO2 23 23  --  23 25 25   GLUCOSE 141* 130*  --  131* 99 121*  BUN 90* 97*  --  91* 80* 73*  CREATININE 4.74* 4.26*  --  3.65* 2.98* 2.76*  CALCIUM 9.6 9.5  --  9.4 9.2 9.1   Liver Function Tests: No results found for this basename: AST, ALT, ALKPHOS, BILITOT, PROT, ALBUMIN,  in the last 168 hours No results found for this basename: LIPASE, AMYLASE,  in the last 168 hours No results found for this basename: AMMONIA,  in the last 168 hours CBC:  Recent Labs Lab 01/20/13 0445  WBC 8.2  HGB 10.9*  HCT 33.5*  MCV 87.7  PLT 247   Cardiac Enzymes: No results found for this basename: CKTOTAL, CKMB, CKMBINDEX, TROPONINI,  in the last 168 hours BNP (last 3  results) No results found for this basename: PROBNP,  in the last 8760 hours CBG: No results found for this basename: GLUCAP,  in the last 168 hours  Recent Results (from the past 240 hour(s))  SURGICAL PCR SCREEN     Status: None   Collection Time    01/18/13 12:18 AM      Result Value Range Status   MRSA, PCR NEGATIVE  NEGATIVE Final   Staphylococcus aureus NEGATIVE  NEGATIVE Final   Comment:            The Xpert SA Assay (FDA     approved for NASAL specimens     in patients over 34 years of age),     is one component of     a comprehensive surveillance     program.  Test performance has     been validated by [employer] for patients greater     than or equal to 3 year old.     It is not intended     to diagnose infection nor to     guide or monitor treatment.     Studies: No results found.  Scheduled Meds: . antiseptic oral rinse  15 mL Mouth Rinse BID  . calcitRIOL  0.25 mcg Oral Daily  . calcium acetate  1,334 mg Oral TID WC  . cloNIDine  0.2 mg Oral TID  . heparin subcutaneous  5,000 Units Subcutaneous Q8H  . hydrALAZINE  50 mg Oral TID  . isosorbide dinitrate  5 mg Oral BID  . predniSONE  40 mg  Oral Q breakfast  . sodium bicarbonate  650 mg Oral BID  . Vitamin D (Ergocalciferol)  50,000 Units Oral Q7 days   Continuous Infusions: . sodium chloride 20 mL/hr at 01/08/13 [facility] Pager [phone]. If 8PM-8AM, please contact night-coverage at [email or web address], password [facility] 01/24/2013, 3:14 PM  LOS: 23 days

## 2013-01-25 LAB — BASIC METABOLIC PANEL
CO2: 27 mEq/L (ref 19–32)
Calcium: 9.8 mg/dL (ref 8.4–10.5)
GFR calc non Af Amer: 28 mL/min — ABNORMAL LOW (ref >90)
Glucose, Bld: 96 mg/dL (ref 70–99)
Potassium: 5.4 mEq/L — ABNORMAL HIGH (ref 3.5–5.1)
Sodium: 138 mEq/L (ref 135–145)

## 2013-01-25 MED ORDER — VITAMIN D (ERGOCALCIFEROL) 1.25 MG (50000 UNIT) PO CAPS: 50000.0000 [IU] | ORAL_CAPSULE | ORAL | Status: DC

## 2013-01-25 MED ORDER — CLONIDINE HCL 0.2 MG PO TABS: 0.2000 mg | ORAL_TABLET | Freq: Three times a day (TID) | ORAL | Status: DC

## 2013-01-25 MED ORDER — CALCIUM ACETATE 667 MG PO CAPS: 1334.0000 mg | ORAL_CAPSULE | Freq: Three times a day (TID) | ORAL | Status: DC

## 2013-01-25 MED ORDER — SODIUM POLYSTYRENE SULFONATE 15 GM/60ML PO SUSP
30.0000 g | Freq: Once | ORAL | Status: DC
  Filled 2013-01-25: qty 120

## 2013-01-25 MED ORDER — CALCITRIOL 0.25 MCG PO CAPS: 0.2500 ug | ORAL_CAPSULE | Freq: Every day | ORAL | Status: DC

## 2013-01-25 MED ORDER — OMEPRAZOLE 40 MG PO CPDR: 40.0000 mg | DELAYED_RELEASE_CAPSULE | Freq: Every day | ORAL | Status: DC

## 2013-01-25 MED ORDER — HYDROMORPHONE HCL 2 MG PO TABS
2.0000 mg | ORAL_TABLET | ORAL | Status: AC
  Administered 2013-01-25: 2 mg via ORAL

## 2013-01-25 MED ORDER — HYDRALAZINE HCL 50 MG PO TABS: 50.0000 mg | ORAL_TABLET | Freq: Three times a day (TID) | ORAL | Status: DC

## 2013-01-25 MED ORDER — ISOSORBIDE DINITRATE 5 MG PO TABS: 5.0000 mg | ORAL_TABLET | Freq: Two times a day (BID) | ORAL | Status: DC

## 2013-01-25 MED ORDER — HYDROMORPHONE HCL 2 MG PO TABS: 1.0000 mg | ORAL_TABLET | ORAL | Status: DC | PRN

## 2013-01-25 MED ORDER — PREDNISONE 20 MG PO TABS: ORAL_TABLET | ORAL | Status: DC

## 2013-01-25 MED ORDER — SODIUM BICARBONATE 650 MG PO TABS: 650.0000 mg | ORAL_TABLET | Freq: Two times a day (BID) | ORAL | Status: DC

## 2013-01-25 NOTE — Discharge Summary (Signed)
Physician Discharge Summary  Rustyn Charnley [id number] DOB: Dec 29, 1955 DOA: 01/01/2013  PCP: No PCP Per Patient  Admit date: 01/01/2013 Discharge date: 01/25/2013  Time spent: >30MINS minutes  Recommendations for Outpatient Follow-up:  Follow-up Information   Follow up with Elam Dutch, MD In 4 weeks. (office will arrange - sent)    Specialty:  Vascular Surgery   Contact information:   [address] [place] [address] [phone]       Please follow up. (SNF md IN 1-2days, follow up k level in sm 11/18)       Follow up with Malka So, MD. (in 1week, call for appt upon discharge)    Specialty:  Urology (For Kidney mass)   Contact information:   [place] [address] [phone]       Follow up with Sherril Croon, MD. (in 1-2weeks, call for appt upon discharge)    Specialty:  Nephrology   Contact information:   [place] [address] [phone]       Follow up with Tobie Lords, MD. (Rheumatologist, call for appt upon discharge )    Specialty:  Rheumatology   Contact information:   [place], [place] [address] [phone]      Follow up Labs per SNF MD -Bmet in am   Discharge Diagnoses:  Principal Problem:   Severe sepsis Active Problems:   GOUT   HYPERTENSION   Legionella pneumonia   ARF (acute renal failure)   Speech abnormality   Metabolic encephalopathy   Metabolic acidosis   Renal mass, left HYPERKALEMIA  Discharge Condition: improved/stable  Diet recommendation: Renal diet  Filed Weights   01/19/13 2145 01/20/13 2045 01/23/13 2152  Weight: 95.89 kg (211 lb 6.4 oz) 95.754 kg (211 lb 1.6 oz) 95.754 kg (211 lb 1.6 oz)    History of present illness:  Brian Lawson is a 57 y.o. male with a history of HTN and Gout who was found on the floor in his home by his mother this evening. EMS was called. His mother reports that he had been ill and had diarrhea and coughing with  increased weakness and poor intake of foods and liquids over the past 3 days. In the ED, he had been found to have a fever to 105 degrees and on chest X-ray he was found to have a LLL Pneumonia. A ct scan of the Head was also performed and found to be negative for acute findings. He was placed on IV Vancomycin and Zosyn and admitted for further evaluation and management   Hospital Course:  Patient is a pleasant 57 year old gentleman with a past medical history of hypertension and gout who presented to the emergency room on 01/02/2013, found on the floor of his home by his mother unresponsive. Over the 3 days prior to hospitalization patient had increasing cough with associated shortness of breath, weakness, minimal by mouth intake as well as diarrhea. On presentation was found to be febrile with a temperature of 105. Initial imaging studies included a chest x-ray which showed left lower lobe patchy airspace disease consistent with pneumonia. He met sepsis criteria and was admitted to the step down unit. He was initially started on broad-spectrum empiric IV antimicrobial therapy with IV vancomycin and Zosyn. Initial CT scan of brain showed no acute intracranial abnormality. However given unresponsiveness he was seen and evaluated by neurology. There was concern that this could be a vascular insult for which MRI of brain was recommended.  On 01/03/2013 MRI showed no acute intracranial abnormality. He also presented with acute renal failure having a creatinine of 3.93 and BUN of 54 on 01/01/2013. His renal failure was further worked up with bilateral kidney ultrasound that was done on 01/02/2013. This study revealed 9 cm masslike area of the left kidney which did not demonstrate significant internal blood flow. Was felt that this likely represented a neoplasm. This was further worked up with an MRI of abdomen without contrast performed on 01/03/2013 which demonstrated an 8.4 x 8.9 x 9.5 cm mass that extended  laterally from the mid and lower pole of the left kidney. Urology was consulted as patient was evaluated by Dr.Wrenn, who recommended followup with renal surgeon for consideration of partial nephrectomy or radical nephrectomy. Dr. Jeffie Pollock reported on a followup note that's procedure could be done laparoscopically but that's this will be further discussed at his office after discharge. He recommended that patient followup with him in 10-14 days after discharge for surgical planning. There were concerns by nephrology that patient could require hemodialysis in the near future. Nephrology recommended AV fistula placement. Initially patient was apprehensive about going this procedure. However he later changed his mind and on 01/18/2013 he underwent AV fistula placement by vascular surgery.  Other issues during this hospitalization, he was found to have Legionella pneumonia as his IV antibiotics were eventually transitioned to oral Levaquin receiving a total of 10 days. From a respiratory standpoint he is stable, has remained afebrile, nontoxic, and overall much improved. He is presently off on antimicrobial therapy.  His hospitalization has been complicated by acute gout, for which was being treated with prednisone and will be discharge on a taper at this time. His allopurinao is on hold for now and to be restarted once  He is over the acute gout flare. He is also to follow up wth Rheumatology outpt- referred to Dr Ouida Sills. He stated that cochicine in the past has not been as effective as prednisone. He was evaluated by physical therapy and they recommended skilled nursing facility placement. social work was consulted and he was placed at [facility].   PROBLEM LIST Acute hypoxic respiratory failure due to Legionella PNA  - As discussed above, resolved. He has been oxygenating well on room air.  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  - Dr. Jeffie Pollock with Urology was consulted and recommended outpt f/u with resection when more stable medically. Will require left nephrectomy.  Acute renal failure  -Patient's kidney function continues to improve, creatinine down to 2.42 today - -Status post AV fistula placement.  -He is to follow up with [facility] outpt Acute gout  - Continue oral prednisone by mouth daily, and taper off as directed on d/c  - on 11/13 received extra dose of prednisone and changed oxycodone to po dilaudid( he states oxycodone does not help)>> and today states feeling better  -continue prednisone and prn dilaudid-dose decreased 11/15 per pt's request  -gradually improving, follow up oupt with SNF MD, also referred to Rheumatology outpt. Malignant HYPERTENSION  -BP control improved with better pain control  -pain and steroids were likely contributing to suboptimal control  -he continue is to continue hydralazine, Isosorbide, clonidine upon d/c and follow up with SNF MD -lisinopril/hctz was dc'ed d/t his renal failure Hyperkalemia -pt was treated with kayexalate today prior to d/c -he is to have Bmet done in  am at SNF to follow up on k and further treat accordingly.  Consultants:  Urology  Nephrology  neurology Procedures:  -EEG -01/18/2013 he underwent AV fistula placement by vascular surgery.    Discharge Exam: Filed Vitals:   01/25/13 1109  BP: 120/80  Pulse:   Temp:   Resp:    Exam:  General: Alert, awake, oriented x3, in no acute distress.  HEENT: No bruits, no goiter.  Heart: Regular rate and rhythm, without murmurs, rubs, gallops.  Lungs: Good air movement, clear to asucultation  Abdomen: Soft, nontender, nondistended, positive bowel sounds.  Neuro: Grossly intact, nonfocal.  Extremities: +Tenderness in L. knees, decreased swelling over ankles and feet   Discharge Instructions  Discharge Orders   Future Appointments Provider Department Dept Phone   02/18/2013 8:30  AM Elam Dutch, MD [facility] [phone]   Future Orders Complete By Expires   Diet renal 60/70-04-12-1198  As directed    Increase activity slowly  As directed        Medication List    STOP taking these medications       allopurinol 100 MG tablet  Commonly known as:  ZYLOPRIM     lisinopril-hydrochlorothiazide 10-12.5 MG per tablet  Commonly known as:  PRINZIDE,ZESTORETIC      TAKE these medications       calcitRIOL 0.25 MCG capsule  Commonly known as:  ROCALTROL  Take 1 capsule (0.25 mcg total) by mouth daily.     calcium acetate 667 MG capsule  Commonly known as:  PHOSLO  Take 2 capsules (1,334 mg total) by mouth 3 (three) times daily with meals.     cloNIDine 0.2 MG tablet  Commonly known as:  CATAPRES  Take 1 tablet (0.2 mg total) by mouth 3 (three) times daily.     hydrALAZINE 50 MG tablet  Commonly known as:  APRESOLINE  Take 1 tablet (50 mg total) by mouth 3 (three) times daily.     HYDROmorphone 2 MG tablet  Commonly known as:  DILAUDID  Take 0.5-1 tablets (1-2 mg total) by mouth every 4 (four) hours as needed for severe pain.     isosorbide dinitrate 5 MG tablet  Commonly known as:  ISORDIL  Take 1 tablet (5 mg total) by mouth 2 (two) times daily.     omeprazole 40 MG capsule  Commonly known as:  PRILOSEC  Take 1 capsule (40 mg total) by mouth daily.     predniSONE 20 MG tablet  Commonly known as:  DELTASONE  Take 50mg  daily for 4days, then 40mg  daily for 4days, then 20mg  daily for 4days, then 10mg  daily for 4days then stop.     sodium bicarbonate 650 MG tablet  Take 1 tablet (650 mg total) by mouth 2 (two) times daily.     Vitamin D (Ergocalciferol) 50000 UNITS Caps capsule  Commonly known as:  DRISDOL  Take 1 capsule (50,000 Units total) by mouth every 7 (seven) days.       No Known Allergies     Follow-up Information   Follow up with Elam Dutch, MD In 4 weeks. (office will arrange - sent)     Specialty:  Vascular Surgery   Contact information:   [address] [place]  [address] [phone]       Please follow up. (SNF md IN 1-2days, follow up k level in sm 11/18)       Follow up with Malka So, MD. (in 1week, call for appt upon  discharge)    Specialty:  Urology   Contact information:   [address] [place] [address] [place] [address] [phone]       Follow up with Sherril Croon, MD. (in 1-2weeks, call for appt upon discharge)    Specialty:  Nephrology   Contact information:   [place] [address] [phone]        The results of significant diagnostics from this hospitalization (including imaging, microbiology, ancillary and laboratory) are listed below for reference.    Significant Diagnostic Studies: Ct Head Wo Contrast  01/01/2013   CLINICAL DATA:  Loss of consciousness  EXAM: CT HEAD WITHOUT CONTRAST  TECHNIQUE: Contiguous axial images were obtained from the base of the skull through the vertex without intravenous contrast.  COMPARISON:  01/13/2007  FINDINGS: No skull fracture is noted. Paranasal sinuses and mastoid air cells are unremarkable. No intracranial hemorrhage, mass effect or midline shift. Stable patchy subcortical white matter decreased attenuation consistent with chronic small vessel ischemic changes. No definite acute cortical infarction. No mass lesion is noted on this unenhanced scan. Ventricular size is stable from prior exam.  IMPRESSION: No acute intracranial abnormality. Stable patchy subcortical white matter decreased attenuation consistent with chronic small vessel ischemic changes.   Electronically Signed   By: Lahoma Crocker M.D.   On: 01/01/2013 19:39   Mr Brain Wo Contrast  01/03/2013   *RADIOLOGY REPORT*  Clinical Data: Found down. Loss of consciousness.  MRI HEAD WITHOUT CONTRAST  Technique:  Multiplanar, multiecho pulse sequences of the brain and surrounding structures were obtained according to standard protocol  without intravenous contrast.  Comparison: CT head without contrast 01/01/2013.  Findings: Scattered periventricular and subcortical T2 hyperintensities are present bilaterally.  No acute infarct, hemorrhage, mass lesion is present.  The ventricles are normal size.  No significant extra-axial fluid collection is present.  Flow is present in the major intracranial arteries.  The globes orbits are intact.  Mild mucosal thickening is most evident in the sphenoid sinuses and ethmoid air cells.  There is some fluid in the mastoid air cells bilaterally.  No obstructing nasopharyngeal lesion is evident.  IMPRESSION:  1.  No acute intracranial abnormality. 2.  Diffuse white matter changes are markedly advanced for age. The finding is nonspecific but can be seen in the setting of chronic microvascular ischemia, a demyelinating process such as multiple sclerosis, vasculitis, complicated migraine headaches, or as the sequelae of a prior infectious or inflammatory process.   Original Report Authenticated By: San Morelle, M.D.   Mr Abdomen Wo Contrast  01/04/2013   CLINICAL DATA:  Left renal mass. No IV contrast was administered due to chronic renal insufficiency  EXAM: MRI ABDOMEN WITHOUT CONTRAST  TECHNIQUE: Multiplanar, multisequence MR imaging was performed. No intravenous contrast was administered.  COMPARISON:  Ultrasound 01/02/2013  FINDINGS: There is a large rounded mass extending laterally from the mid and lower pole of the left kidney. The mass measures 8.4 x 8.9 x 9.5 cm. The mass pass encapsulating 7 mm rim on the T2 weighted images (series 4). There is extensive nodularity extending centrally from this capsulated margin. There is some high T2 signal centrally suggesting some cystic change or hemorrhage. No evidence of macroscopic fat within the lesion on the fat-suppressed T1 sequences.  The mass lesion is removed from the hilum without gross involvement of the renal vein on this noncontrast exam. The  right kidney appears normal. Adrenal glands normal. No retroperitoneal adenopathy.  The liver, gallbladder, pancreas, spleen  are normal. EXAM is this degraded by patient respiratory motion.  IMPRESSION: 1. 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  2. No evidence of metastasis or involvement of the renal vein this non contrast exam. If the patient is a chronic dialysis patient, a CT with IV contrast could be performed.   Electronically Signed   By: Suzy Bouchard M.D.   On: 01/04/2013 08:15   US Renal  01/02/2013   CLINICAL DATA:  Renal failure.  EXAM: RENAL/URINARY TRACT ULTRASOUND COMPLETE  COMPARISON:  None.  FINDINGS: Right Kidney  Length: 10.0 cm. The right kidney demonstrates increased cortical echogenicity without significant atrophy or no evidence of hydronephrosis. No focal lesions are identified. There is no evidence of hydronephrosis.  Left Kidney  Length: 10.9 cm. Heterogeneous mass emanates from the lateral aspect of the mid to lower left kidney. This mass appears solid but does not demonstrate a great deal of internal blood flow. This mass-like area measures approximately 8.8 x 8.8 x 7.8 cm. The kidney itself shows no evidence of hydronephrosis.  Bladder  Appears normal for degree of bladder distention.  IMPRESSION: No evidence of hydronephrosis. Nearly 9 cm mass-like area of the left kidney which does not demonstrate significant internal blood flow. This is likely a neoplasm but could potentially also represent a complicated cyst. In the setting of renal failure, further evaluation would be helpful with unenhanced MRI of the abdomen.   Electronically Signed   By: Aletta Edouard M.D.   On: 01/02/2013 15:06   Dg Chest Port 1 View  01/03/2013   CLINICAL DATA:  Central line placement.  EXAM: PORTABLE CHEST - 1 VIEW  COMPARISON:  Earlier film of the same day  FINDINGS: Interval placement of a right IJ central line, tip near the cavoatrial  junction. No pneumothorax. Persistent left lower lung consolidation/ atelectasis. Right lung clear. Heart size upper limits normal. Regional bones unremarkable.  IMPRESSION: 1. Central line placement to cavoatrial junction without pneumothorax.   Electronically Signed   By: Arne Cleveland M.D.   On: 01/03/2013 22:35   Dg Chest Port 1 View  01/03/2013   CLINICAL DATA:  Cough, pneumonia  EXAM: PORTABLE CHEST - 1 VIEW  COMPARISON:  01/01/2013  FINDINGS: Heart size and vascular pattern normal. Right lung is clear. Extensive hazy opacity throughout the left lower lobe. This is significantly increased when compared to the prior study.  IMPRESSION: Extensive hazy left lower lobe airspace opacification, significantly worse when compared to the prior study.   Electronically Signed   By: Skipper Cliche M.D.   On: 01/03/2013 07:48   Dg Chest Port 1 View  01/01/2013   CLINICAL DATA:  Fever.  EXAM: PORTABLE CHEST - 1 VIEW  COMPARISON:  None.  FINDINGS: Cardiopericardial silhouette and mediastinal contours appear within normal limits. There is patchy density in the left base favored to represent airspace disease/ pneumonia over atelectasis. This likely resides within the left lower lobe. Increased retrocardiac density is present. Monitoring leads project over the chest. Followup to ensure radiographic clearing and exclude an underlying lesion is recommended. Typically clearing will be observed at 8 weeks.  IMPRESSION: Left lower lobe patchy airspace disease compatible with pneumonia.   Electronically Signed   By: Dereck Ligas M.D.   On: 01/01/2013 19:07    Microbiology: Recent Results (from the past 240 hour(s))  SURGICAL PCR SCREEN     Status: None   Collection Time  01/18/13 12:18 AM      Result Value Range Status   MRSA, PCR NEGATIVE  NEGATIVE Final   Staphylococcus aureus NEGATIVE  NEGATIVE Final   Comment:            The Xpert SA Assay (FDA     approved for NASAL specimens     in patients over 48  years of age),     is one component of     a comprehensive surveillance     program.  Test performance has     been validated by [employer] for patients greater     than or equal to 48 year old.     It is not intended     to diagnose infection nor to     guide or monitor treatment.     Labs: Basic Metabolic Panel:  Recent Labs Lab 01/19/13 0515 01/19/13 1825 01/20/13 0445 01/21/13 0547 01/22/13 0555 01/25/13 0530  NA 137  --  141 141 140 138  K 6.3* 4.9 4.7 4.5 5.2* 5.4*  CL 101  --  104 105 104 102  CO2 23  --  23 25 25 27   GLUCOSE 130*  --  131* 99 121* 96  BUN 97*  --  91* 80* 73* 73*  CREATININE 4.26*  --  3.65* 2.98* 2.76* 2.42*  CALCIUM 9.5  --  9.4 9.2 9.1 9.8   Liver Function Tests: No results found for this basename: AST, ALT, ALKPHOS, BILITOT, PROT, ALBUMIN,  in the last 168 hours No results found for this basename: LIPASE, AMYLASE,  in the last 168 hours No results found for this basename: AMMONIA,  in the last 168 hours CBC:  Recent Labs Lab 01/20/13 0445  WBC 8.2  HGB 10.9*  HCT 33.5*  MCV 87.7  PLT 247   Cardiac Enzymes: No results found for this basename: CKTOTAL, CKMB, CKMBINDEX, TROPONINI,  in the last 168 hours BNP: BNP (last 3 results) No results found for this basename: PROBNP,  in the last 8760 hours CBG: No results found for this basename: GLUCAP,  in the last 168 hours     Signed:  Mari Battaglia C  [facility] 01/25/2013, 1:56 PM

## 2013-01-26 ENCOUNTER — Other Ambulatory Visit: Payer: Self-pay | Admitting: *Deleted

## 2013-01-26 MED ORDER — HYDROMORPHONE HCL 2 MG PO TABS: ORAL_TABLET | ORAL | Status: DC

## 2013-01-29 ENCOUNTER — Non-Acute Institutional Stay (SKILLED_NURSING_FACILITY): Payer: Medicaid Other | Admitting: Internal Medicine

## 2013-01-29 DIAGNOSIS — M109 Gout, unspecified: Secondary | ICD-10-CM

## 2013-01-29 DIAGNOSIS — N184 Chronic kidney disease, stage 4 (severe): Secondary | ICD-10-CM

## 2013-01-29 DIAGNOSIS — A481 Legionnaires' disease: Secondary | ICD-10-CM

## 2013-01-29 NOTE — Progress Notes (Signed)
Patient ID: Brian Lawson, male   DOB: [birth year], 57 y.o.   MRN: [id number]  Facility; [facility] Chief complaint; admission to SNF post admit to [facility]  from October 24 to November 17  History; this is a 60 presented with increasing fatigue, diarrhea and coughing over the past several days prior to admission. In the emergency room he was found to have a fever of 105 and a chest x-ray showed left lower lobe pneumonia. A CT scan of the head was negative for. He received empiric broad-spectrum antibiotics Cipro subsequently a urine for Legionella antigen came back positive. He was switched to specific treatment for this.  He was noted to be in acute renal failure with bilateral renal ultrasound showing a mass in the left kidney. An MRI of the kidney showed an 8.4 x 8.9 x 9.5 cm mass in the mid and lower pole of the left kidney. He has followup with urology will likely require a left nephrectomy  Other issues included the acute on chronic gout, acute renal failure with discharge creatinine of 2.42 he is status post AV fistula placement, and acute hypoxic respiratory failure secondary to Legionella   has a past medical history of Hypertension and Gout.  History   Social History  . Marital Status: Single    Spouse Name: N/A    Number of Children: N/A  . Years of Education: N/A   Occupational History  . Not on file.   Social History Main Topics  . Smoking status: Former Research scientist (life sciences)  . Smokeless tobacco: Never Used  . Alcohol Use: Yes     Comment: ocassionally  . Drug Use: No  . Sexual Activity: Not on file   Other Topics Concern  . Not on file   Social History Narrative  . No narrative on file   Patient lives in his own home. Independent with ADLs and IADLs. Describes himself as a Ship broker that [employer]   Review of systems; Respiratory no shortness of breath,cough Cardiac; no clear chest pain  abdomen no abdominal pain or diarrhea Muscluloskeleta; severe lower extremity pain and  weakness in his leg  Physical examination Gen. 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 Several of his PIP joints. 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 He has antigravity strength in the lower extremities although there is significant weakness.  Impression/plan #1 recovering from Legionella pneumonia there are no active issues here; it would appear that he has completed his antibiotics #2 severe gout; apparently the patient has had this since age 76. He has had several flares of this in general a just tends to wait them out or use NSAIDs she clearly would not be able to use now. He states that allopurinol has not been effective and neither has colchicine. He is unaware of what his uric acid level is. Currently he is on a prednisone taper and I don't really know what else we can do about this.  #3 acute on chronic renal failure the exact nature of this is unclear. His uric acid level is 12.8 in the hospital therefore this is probably not uric acid nephropathy. He will certainly need a way of reducing his uric acit level.  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 #5 lower extremity weakness I am assuming this is disuse and pain although this may need to be followed in looked at again.

## 2013-02-10 ENCOUNTER — Non-Acute Institutional Stay (SKILLED_NURSING_FACILITY): Payer: Medicaid Other | Admitting: Family

## 2013-02-10 ENCOUNTER — Emergency Department (HOSPITAL_COMMUNITY)
Admission: EM | Admit: 2013-02-10 | Discharge: 2013-02-10 | Disposition: A | Discharge status: Home / Self Care | Payer: Medicaid Other | Attending: Emergency Medicine | Admitting: Emergency Medicine

## 2013-02-10 ENCOUNTER — Encounter (HOSPITAL_COMMUNITY): Payer: Self-pay | Admitting: Emergency Medicine

## 2013-02-10 DIAGNOSIS — L089 Local infection of the skin and subcutaneous tissue, unspecified: Secondary | ICD-10-CM

## 2013-02-10 DIAGNOSIS — I10 Essential (primary) hypertension: Secondary | ICD-10-CM

## 2013-02-10 DIAGNOSIS — R000 Tachycardia, unspecified: Secondary | ICD-10-CM | POA: Insufficient documentation

## 2013-02-10 DIAGNOSIS — Z79899 Other long term (current) drug therapy: Secondary | ICD-10-CM | POA: Insufficient documentation

## 2013-02-10 DIAGNOSIS — IMO0002 Reserved for concepts with insufficient information to code with codable children: Secondary | ICD-10-CM | POA: Insufficient documentation

## 2013-02-10 DIAGNOSIS — N184 Chronic kidney disease, stage 4 (severe): Secondary | ICD-10-CM

## 2013-02-10 DIAGNOSIS — T8140XA Infection following a procedure, unspecified, initial encounter: Secondary | ICD-10-CM | POA: Insufficient documentation

## 2013-02-10 DIAGNOSIS — Z87891 Personal history of nicotine dependence: Secondary | ICD-10-CM | POA: Insufficient documentation

## 2013-02-10 DIAGNOSIS — M109 Gout, unspecified: Secondary | ICD-10-CM

## 2013-02-10 DIAGNOSIS — Y838 Other surgical procedures as the cause of abnormal reaction of the patient, or of later complication, without mention of misadventure at the time of the procedure: Secondary | ICD-10-CM | POA: Insufficient documentation

## 2013-02-10 LAB — BASIC METABOLIC PANEL
BUN: 58 mg/dL — ABNORMAL HIGH (ref 6–23)
CO2: 25 mEq/L (ref 19–32)
Calcium: 10.6 mg/dL — ABNORMAL HIGH (ref 8.4–10.5)
GFR calc Af Amer: 27 mL/min — ABNORMAL LOW (ref >90)
GFR calc non Af Amer: 23 mL/min — ABNORMAL LOW (ref >90)
Sodium: 138 mEq/L (ref 135–145)

## 2013-02-10 LAB — CBC
HCT: 39.3 % (ref 39.0–52.0)
MCH: 28.2 pg (ref 26.0–34.0)
MCV: 85.2 fL (ref 78.0–100.0)
Platelets: 223 10*3/uL (ref 150–400)
RBC: 4.61 MIL/uL (ref 4.22–5.81)
RDW: 15.3 % (ref 11.5–15.5)

## 2013-02-10 LAB — LACTIC ACID, PLASMA: Lactic Acid, Venous: 3.3 mmol/L — ABNORMAL HIGH (ref 0.5–2.2)

## 2013-02-10 LAB — URIC ACID: Uric Acid, Serum: 11.7 mg/dL — ABNORMAL HIGH (ref 4.0–7.8)

## 2013-02-10 MED ORDER — CEPHALEXIN 500 MG PO CAPS: 500.0000 mg | ORAL_CAPSULE | Freq: Four times a day (QID) | ORAL | Status: DC

## 2013-02-10 MED ORDER — SODIUM CHLORIDE 0.9 % IV BOLUS (SEPSIS)
500.0000 mL | Freq: Once | INTRAVENOUS | Status: AC
  Administered 2013-02-10: 500 mL via INTRAVENOUS

## 2013-02-10 NOTE — ED Notes (Signed)
Pt arrives via [facility] from [facility]. EMS reports leg swelling x3 weeks with hx of gout, pulses intact. Pt had appt with nephrologists today but did not make it, recently had fistula put in right arm. Pt alert and oriented.

## 2013-02-10 NOTE — ED Provider Notes (Signed)
Medical screening examination/treatment/procedure(s) were conducted as a shared visit with non-physician practitioner(s) and myself.  I personally evaluated the patient during the encounter.  EKG Interpretation   None       Pt with gout, being treated with steroids, now with infected surgical site for fistula.  Dr. Kellie Simmering and his PA evaluated, cleaned it out, safe for pt to follow up in clinic in 8 days, recommends outpt keflex.  Pt understands and is agreeable.    Saddie Benders. Braelen Sproule, MD 02/10/13 1514

## 2013-02-10 NOTE — Progress Notes (Addendum)
[facility] NOTE   MRN : [id number]  Reason for Consult: Left distal incision drainage s/p 1st stage AV fistula creation Referring Physician: Saddie Benders. Ghim, MD   History of Present Illness: He was brought to the ED for a gout flare up.  On examination it was discovered that the right distal upper arm incision was drainage and appeared infectioned.  The AV fistula creation was performed by Dr. Oneida Alar on 01/19/2013.  He has a follow up appointment next week with Dr. Oneida Alar.  We were asked to evaluate the fistula.     No current facility-administered medications for this encounter.   Current Outpatient Prescriptions  Medication Sig Dispense Refill  . calcitRIOL (ROCALTROL) 0.25 MCG capsule Take 0.25 mcg by mouth daily.      . calcium acetate (PHOSLO) 667 MG capsule Take 1,334 mg by mouth 3 (three) times daily with meals.      . cloNIDine (CATAPRES) 0.2 MG tablet Take 0.2 mg by mouth 3 (three) times daily.      . hydrALAZINE (APRESOLINE) 50 MG tablet Take 50 mg by mouth 3 (three) times daily.      . isosorbide dinitrate (ISORDIL) 5 MG tablet Take 5 mg by mouth 2 (two) times daily.      Marland Kitchen omeprazole (PRILOSEC) 40 MG capsule Take 40 mg by mouth daily.      . polyethylene glycol (MIRALAX / GLYCOLAX) packet Take 17 g by mouth daily.      . predniSONE (DELTASONE) 20 MG tablet Take 10 mg by mouth daily. Take 50mg  daily for 4days, then 40mg  daily for 4days, then 20mg  daily for 4days, then 10mg  daily for 4days then stop.      . sodium bicarbonate 650 MG tablet Take 650 mg by mouth 2 (two) times daily.      . Vitamin D, Ergocalciferol, (DRISDOL) 50000 UNITS CAPS capsule Take 50,000 Units by mouth every 7 (seven) days.      . cephALEXin (KEFLEX) 500 MG capsule Take 1 capsule (500 mg total) by mouth 4 (four) times daily.  28 capsule  0  . HYDROmorphone (DILAUDID) 2 MG tablet Take 1 mg by mouth every 4 (four) hours as needed for moderate pain. Take 1/2 tablet by  mouth every four hours as needed for moderate pain; Take one tablet by mouth every four hours as needed for severe pain        Pt meds include: Statin :No Betablocker: No ASA: No Other anticoagulants/antiplatelets:   Past Medical History  Diagnosis Date  . Hypertension   . Gout     Past Surgical History  Procedure Laterality Date  . Hernia repair    . Bascilic vein transposition Right 01/18/2013    Procedure: BASCILIC VEIN TRANSPOSITION;  Surgeon: Elam Dutch, MD;  Location: [facility] OR;  Service: Vascular;  Laterality: Right;    Social History History  Substance Use Topics  . Smoking status: Former Research scientist (life sciences)  . Smokeless tobacco: Never Used  . Alcohol Use: Yes     Comment: ocassionally    Family History Mother is living at age 70 without medical issues  Father deceased at age 59 with throat cancer  Sister living age 24's with HTN   No Known Allergies   REVIEW OF SYSTEMS  General: [ ]  Weight loss, [ ]  Fever, [ ]  chills Neurologic: [ ]  Dizziness, [ ]  Blackouts, [ ]  Seizure [ ]  Stroke, [ ]  "Mini stroke", [ ]  Slurred speech, [ ]  Temporary  blindness; [ ]  weakness in arms or legs, [ ]  Hoarseness [ ]  Dysphagia Cardiac: [ ]  Chest pain/pressure, [ ]  Shortness of breath at rest [ ]  Shortness of breath with exertion, [ ]  Atrial fibrillation or irregular heartbeat  Vascular: [ ]  Pain in legs with walking, [ ]  Pain in legs at rest, [ ]  Pain in legs at night,  [ ]  Non-healing ulcer, [ ]  Blood clot in vein/DVT,   Pulmonary: [ ]  Home oxygen, [ ]  Productive cough, [ ]  Coughing up blood, [ ]  Asthma,  [ ]  Wheezing [ ]  COPD Musculoskeletal:  [ ]  Arthritis, [ ]  Low back pain, [x ] Joint pain Hematologic: [ ]  Easy Bruising, [ ]  Anemia; [ ]  Hepatitis Gastrointestinal: [ ]  Blood in stool, [ ]  Gastroesophageal Reflux/heartburn, Urinary: [x ] chronic Kidney disease, [ ]  on HD - [ ]  MWF or [ ]  TTHS, [ ]  Burning with urination, [ ]  Difficulty urinating Skin: [ ]  Rashes, [x ]  Wounds Psychological: [ ]  Anxiety, [ ]  Depression  Physical Examination Filed Vitals:   02/10/13 1215 02/10/13 1315 02/10/13 1400 02/10/13 1445  BP: 134/89 145/98 146/99 148/101  Pulse: 115  111 106  Temp:      TempSrc:      Resp:      Height:      Weight:      SpO2: 96%  98% 100%   Body mass index is 25.12 kg/(m^2).  General:  WDWN in NAD HENT: WNL Eyes: Pupils equal Pulmonary: normal non-labored breathing Cardiac: RRR Abdomen: soft, NT, no masses Skin: Right upper arm distal medial vein harvest incision with purulent drainage.  No erythema. Vascular Exam/Pulses:Radial pulse intact palpable.  Sensation intact and equal distally bil. Upper extremities.   Musculoskeletal: no muscle wasting or atrophy; no edema  Neurologic: A&O X 3; Appropriate Affect ;  SENSATION: normal; MOTOR FUNCTION: 5/5 Symmetric Speech is fluent/normal   Significant Diagnostic Studies: CBC Lab Results  Component Value Date   WBC 7.5 02/10/2013   HGB 13.0 02/10/2013   HCT 39.3 02/10/2013   MCV 85.2 02/10/2013   PLT 223 02/10/2013    BMET    Component Value Date/Time   NA 138 02/10/2013 1235   K 4.4 02/10/2013 1235   CL 95* 02/10/2013 1235   CO2 25 02/10/2013 1235   GLUCOSE 111* 02/10/2013 1235   BUN 58* 02/10/2013 1235   CREATININE 2.83* 02/10/2013 1235   CALCIUM 10.6* 02/10/2013 1235   GFRNONAA 23* 02/10/2013 1235   GFRAA 27* 02/10/2013 1235   Estimated Creatinine Clearance: 34 ml/min (by C-G formula based on Cr of 2.83).  COAG Lab Results  Component Value Date   INR 1.17 01/03/2013     Procedure: Clean debridement at bedside of vein harvest incision. Wet to dry guaze packing after debridement and wrap with dry guaze. Patient tolerated well.   ASSESSMENT/PLAN:  Wet to dry dressing changes BID. Keflex 500 mg PO TID 7-10 days F/U in the office with Dr. Oneida Alar Thursday 02/19/2013.   Laurence Slate [facility] 02/10/2013 3:18 PM  Agree with above assessment Next vein harvesting  incision right forearm was opened after removing superficial sutures. This was packed with more saline gauze. We'll pack twice daily as an outpatient and return to see Dr. Oneida Alar in office next week as scheduled Also treat with Keflex 3 times a day No evidence of cellulitis and surrounding tissues and other incisions seem uninvolved

## 2013-02-10 NOTE — ED Provider Notes (Signed)
CSN: [id number]     Arrival date & time 02/10/13  1148 History   First MD Initiated Contact with Patient 02/10/13 1154     Chief Complaint  Patient presents with  . Leg Swelling   (Consider location/radiation/quality/duration/timing/severity/associated sxs/prior Treatment) HPI Comments: Pt is a 57 y/o male with a PMHx of HTN and gout who presents to the ED via EMS from [facility] rehab center complaining of a gout flare in both his ankles, R worse than L x 3 weeks. He is taking 40 mg prednisone daily without any change and has been given dilaudid which he does not want because it is making him constipated. Pain severe, feels the same as his gout flares he has been dealing with since he was in his 60s. He is in rehab because his nephrologist wants him "to get really strong before he has his kidney removed". He is newly diagnosed with kidney disease, will have one removed soon, and the other will be evaluated thereafter. A bascilic vein transposition for a fistula was performed on 11/10, over the past few weeks he has had drainage and tenderness in the area. Unsure if he has had fevers. Denies cp, sob, n/v, abdominal pain. He was also admitted into the hospital on 11/24 for CAP and severe sepsis.  The history is provided by the patient.    Past Medical History  Diagnosis Date  . Hypertension   . Gout    Past Surgical History  Procedure Laterality Date  . Hernia repair    . Bascilic vein transposition Right 01/18/2013    Procedure: BASCILIC VEIN TRANSPOSITION;  Surgeon: Elam Dutch, MD;  Location: [place];  Service: Vascular;  Laterality: Right;   No family history on file. History  Substance Use Topics  . Smoking status: Former Research scientist (life sciences)  . Smokeless tobacco: Never Used  . Alcohol Use: Yes     Comment: ocassionally    Review of Systems  Musculoskeletal:       Positive for bilateral ankle pain and swelling.  Skin: Positive for wound.  All other systems reviewed and are  negative.    Allergies  Review of patient's allergies indicates no known allergies.  Home Medications   Current Outpatient Rx  Name  Route  Sig  Dispense  Refill  . calcitRIOL (ROCALTROL) 0.25 MCG capsule   Oral   Take 0.25 mcg by mouth daily.         . calcium acetate (PHOSLO) 667 MG capsule   Oral   Take 1,334 mg by mouth 3 (three) times daily with meals.         . cloNIDine (CATAPRES) 0.2 MG tablet   Oral   Take 0.2 mg by mouth 3 (three) times daily.         . hydrALAZINE (APRESOLINE) 50 MG tablet   Oral   Take 50 mg by mouth 3 (three) times daily.         . isosorbide dinitrate (ISORDIL) 5 MG tablet   Oral   Take 5 mg by mouth 2 (two) times daily.         Marland Kitchen omeprazole (PRILOSEC) 40 MG capsule   Oral   Take 40 mg by mouth daily.         . polyethylene glycol (MIRALAX / GLYCOLAX) packet   Oral   Take 17 g by mouth daily.         . predniSONE (DELTASONE) 20 MG tablet   Oral   Take 10 mg  by mouth daily. Take 50mg  daily for 4days, then 40mg  daily for 4days, then 20mg  daily for 4days, then 10mg  daily for 4days then stop.         . sodium bicarbonate 650 MG tablet   Oral   Take 650 mg by mouth 2 (two) times daily.         . Vitamin D, Ergocalciferol, (DRISDOL) 50000 UNITS CAPS capsule   Oral   Take 50,000 Units by mouth every 7 (seven) days.         Marland Kitchen HYDROmorphone (DILAUDID) 2 MG tablet   Oral   Take 1 mg by mouth every 4 (four) hours as needed for moderate pain. Take 1/2 tablet by mouth every four hours as needed for moderate pain; Take one tablet by mouth every four hours as needed for severe pain          BP 146/99  Pulse 111  Temp(Src) 97.8 F (36.6 C) (Oral)  Resp 20  Ht 6\' 3"  (1.905 m)  Wt 201 lb (91.173 kg)  BMI 25.12 kg/m2  SpO2 98% Physical Exam  Nursing note and vitals reviewed. Constitutional: He is oriented to person, place, and time. He appears well-developed and well-nourished. No distress.  HENT:  Head:  Normocephalic and atraumatic.  Mouth/Throat: Oropharynx is clear and moist.  Eyes: Conjunctivae and EOM are normal. Pupils are equal, round, and reactive to light.  Neck: Normal range of motion. Neck supple.  Cardiovascular: Regular rhythm, normal heart sounds and intact distal pulses.  Tachycardia present.   Pulmonary/Chest: Effort normal and breath sounds normal.  Abdominal: Soft. Bowel sounds are normal. There is no tenderness.  Musculoskeletal:  TTP medial aspect of bilateral ankles, marked swelling to right ankle, mild swelling left ankle. No erythema or warmth. ROM limited due to pain. Intact distal pulses.  Neurological: He is alert and oriented to person, place, and time.  Skin: Skin is warm and dry. He is not diaphoretic.  Right arm graft in place with 2 cm open area with purulent drainage, tender. No surrounding cellulitis.  Psychiatric: He has a normal mood and affect. His behavior is normal.    ED Course  Procedures (including critical care time) Labs Review Labs Reviewed  BASIC METABOLIC PANEL - Abnormal; Notable for the following:    Chloride 95 (*)    Glucose, Bld 111 (*)    BUN 58 (*)    Creatinine, Ser 2.83 (*)    Calcium 10.6 (*)    GFR calc non Af Amer 23 (*)    GFR calc Af Amer 27 (*)    All other components within normal limits  LACTIC ACID, PLASMA - Abnormal; Notable for the following:    Lactic Acid, Venous 3.3 (*)    All other components within normal limits  URIC ACID - Abnormal; Notable for the following:    Uric Acid, Serum 11.7 (*)    All other components within normal limits  CULTURE, BLOOD (ROUTINE X 2)  CULTURE, BLOOD (ROUTINE X 2)  CBC   Imaging Review No results found.  EKG Interpretation   None       MDM   1. Gout   2. Infected fluid collection with fistula     Pt presenting with bilateral ankle pain and swelling, symptoms similar to prior gout flare ups. Currently preparing for kidney removal, further evaluation into other kidney  function will happen after. Cannot give colchicine. Already on 40mg  prednisone daily. He is afebrile, tachycardic, in NAD. Also with  purulent drainage to right arm where surgery for fistula was done. Recent admission for sepsis from CAP. Labs pending- cbc, bmp, uric acid, lactate, blood cultures. He is refusing pain medication at this time. 2:50 PM No leukocytosis. Lactic acid 3.3. Uric acid 11.7. He remains well appearing and in NAD. Fistula site appears infected, I spoke with vascular surgery Dr. Kellie Simmering who will evaluate site. 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 Case discussed with attending Dr. Dorna Mai who also evaluated patient and agrees with plan of care. 3:18 PM Pt evaluated by Dr. Kellie Simmering and his PA, area cleaned out, wet-to-dry dressing placed, suggested starting keflex, ok to discharge, f/u with Dr. Oneida Alar scheduled for next week. Regarding gout, pt will f/u with his PCP. Stable for discharge. Return precautions given. Patient states understanding of treatment care plan and is agreeable.  Illene Labrador, PA-C 02/10/13 1521

## 2013-02-10 NOTE — Progress Notes (Signed)
    Patient ID: Caydon Bala, male   DOB: 05/27/1955, 57 y.o.   MRN: [id number] Date: 02/10/13 Facility: [facility]  Code Status:  Full  Chief Complaint  Patient presents with  . Acute Visit    Elevated b/p    HPI: NP notified of pt's bp 160/110. Nurse noted pt is diaphoretic with tachycardia.  Pt reports acute onset of malaise accompanied with diaphoresis after PT mobility and exercises. Pt reports non-adherence with medication regimen for pain, hypertension, and gout.  Pt further complains of  nausea and having intermittent rectal pain since enema x 3 days.  Pt denies fever, chills, diarrhea; however, reports recent history of constipation.  Pt agreed to am medications, shortly following administration of routine meds, pt is noted tachypnea with RR ranging from 35-40, apical pulse 160 and b/p 116/86.       No Known Allergies   DATA REVIEWED  Laboratory Studies: Reviewed     Past Medical History  Diagnosis Date  . Hypertension   . Gout      Past Surgical History  Procedure Laterality Date  . Hernia repair    . Bascilic vein transposition Right 01/18/2013    Procedure: BASCILIC VEIN TRANSPOSITION;  Surgeon: Elam Dutch, MD;  Location: [place];  Service: Vascular;  Laterality: Right;     History   Social History  . Marital Status: Single    Spouse Name: N/A    Number of Children: N/A  . Years of Education: N/A   Occupational History  . Not on file.   Social History Main Topics  . Smoking status: Former Research scientist (life sciences)  . Smokeless tobacco: Never Used  . Alcohol Use: Yes     Comment: ocassionally  . Drug Use: No  . Sexual Activity: Not on file   Other Topics Concern  . Not on file   Social History Narrative  . No narrative on file     Review of Systems  Constitutional: Positive for malaise/fatigue and diaphoresis.  HENT: Negative.   Eyes: Negative.   Respiratory: Positive for shortness of breath.   Cardiovascular: Negative.   Gastrointestinal: Positive  for constipation.  Genitourinary: Negative.   Musculoskeletal: Positive for joint pain.  Neurological: Negative.   Psychiatric/Behavioral: Negative.     Physical Exam Filed Vitals:   02/10/13 1522  BP: 140/100  Pulse: 117  Temp: 97.9 F (36.6 C)  Resp: 32   There is no weight on file to calculate BMI. Physical Exam  Constitutional: He is oriented to person, place, and time. He appears ill.  Dressed and groomed appropriately; lying in Semi-Fowler's position in facility's bed  Cardiovascular: An irregular rhythm present. Tachycardia present.   Pulses:      Radial pulses are 3+ on the right side, and 3+ on the left side.  Pulmonary/Chest: Tachypnea noted. He has decreased breath sounds in the right lower field and the left lower field.  Abdominal: Soft.  Musculoskeletal:  Joint tenderness/pain  Neurological: He is alert and oriented to person, place, and time. GCS eye subscore is 4. GCS verbal subscore is 5. GCS motor subscore is 6.    ASSESSMENT/PLAN  Send to ER for evaluation Colace 100 mg bid daily Senna tablet daily  Hemoccult stools x 3  Follow up:prn  Spent 60 minutes

## 2013-02-10 NOTE — ED Notes (Signed)
Vascular at bedside

## 2013-02-10 NOTE — ED Notes (Signed)
Placed call for transportation back home

## 2013-02-12 ENCOUNTER — Telehealth (HOSPITAL_COMMUNITY): Payer: Self-pay

## 2013-02-12 NOTE — ED Notes (Signed)
Late entry 0200 report called to [facility] office for + Blood Cultures (Gram + Rods in Anaerobic bottle).  02:50 Sent to EDP for eval.  [id number] per MD Roxanne Mins report to MD Fields in AM.  Pt is scheduled to have follow up with pt next week Alanson Aly RN, PFM)

## 2013-02-12 NOTE — ED Notes (Signed)
Preliminary Bld Cx result faxed to Dr Ruta Hinds Attn @ [phone].

## 2013-02-14 LAB — CULTURE, BLOOD (ROUTINE X 2)

## 2013-02-16 LAB — CULTURE, BLOOD (ROUTINE X 2): Culture: NO GROWTH

## 2013-02-17 ENCOUNTER — Encounter: Payer: Self-pay | Admitting: Vascular Surgery

## 2013-02-18 ENCOUNTER — Encounter: Payer: Self-pay | Admitting: *Deleted

## 2013-02-18 ENCOUNTER — Encounter: Payer: Self-pay | Admitting: Vascular Surgery

## 2013-02-18 ENCOUNTER — Ambulatory Visit (INDEPENDENT_AMBULATORY_CARE_PROVIDER_SITE_OTHER): Payer: MEDICAID | Admitting: Vascular Surgery

## 2013-02-18 VITALS — BP 122/83 | HR 93 | Ht 75.0 in | Wt 201.0 lb

## 2013-02-18 DIAGNOSIS — N186 End stage renal disease: Secondary | ICD-10-CM

## 2013-02-18 DIAGNOSIS — Z4931 Encounter for adequacy testing for hemodialysis: Secondary | ICD-10-CM

## 2013-02-18 NOTE — Progress Notes (Signed)
This is a 57 year old male who underwent first stage basilic vein transposition in his right arm November 11. He developed an infection in one of his vein harvest sites. This was recently drained by Dr. Kellie Simmering. He returns today for further followup. He is continuing to do local wound care.  Physical exam:  Filed Vitals:   02/18/13 0926  BP: 122/83  Pulse: 93  Height: 6\' 3"  (1.905 m)  Weight: 201 lb (91.173 kg)  SpO2: 100%    Right upper extremity: Easily palpable thrill in fistula, longitudinal incision just below the antecubital area is open but with well healthy-appearing regulation tissue, hand is warm and well-perfused  Assessment: Healing infection from vein harvest site, patent fistula  Plan: Followup in 6 weeks to do duplex of the fistula to see if he is ready for second stage. He will need to have his wounds completely healed prior to that.  Ruta Hinds, MD [facility] [place] Office: [phone] Pager: [phone]

## 2013-03-23 ENCOUNTER — Non-Acute Institutional Stay (SKILLED_NURSING_FACILITY): Payer: Medicaid Other | Admitting: Family

## 2013-03-23 ENCOUNTER — Encounter: Payer: Self-pay | Admitting: Family

## 2013-03-23 DIAGNOSIS — I10 Essential (primary) hypertension: Secondary | ICD-10-CM

## 2013-03-23 DIAGNOSIS — M109 Gout, unspecified: Secondary | ICD-10-CM

## 2013-03-23 DIAGNOSIS — N184 Chronic kidney disease, stage 4 (severe): Secondary | ICD-10-CM

## 2013-03-23 NOTE — Progress Notes (Signed)
Patient ID: [facility], male   DOB: [birth year], 58 y.o.   MRN: [id number]  Date: 03/23/13 Facility: [facility]  Code Status:  DNR  Chief Complaint  Patient presents with  . Discharge Note    HPI: Pt was admitted to facility for short-term rehab s/p acute hypoxic respiratory failure due to PNA, with co-morbidities: ARF, Left Renal Mass, Acute onset of Gout, Malignant HTN, and Hyperkalemia.  Nephrology is currently consulting on pt d/t possible need for nephrectomy.  Rehab clinical course was somewhat compromised due to patient gout complications being unstable; however, pt has a follow up appt with rheumatologist at [facility]. Pt will need extensive assistance navigating the health care system to meet his health care needs.  No further issues/concerns expressed by pt or health care team at time of discharge.    No Known Allergies   Medication List       This list is accurate as of: 03/23/13  3:45 PM.  Always use your most recent med list.               calcitRIOL 0.25 MCG capsule  Commonly known as:  ROCALTROL  Take 0.25 mcg by mouth daily.     calcium acetate 667 MG capsule  Commonly known as:  PHOSLO  Take 1,334 mg by mouth 3 (three) times daily with meals.     cloNIDine 0.2 MG tablet  Commonly known as:  CATAPRES  Take 0.2 mg by mouth 3 (three) times daily.     hydrALAZINE 50 MG tablet  Commonly known as:  APRESOLINE  Take 50 mg by mouth 3 (three) times daily.     HYDROmorphone 2 MG tablet  Commonly known as:  DILAUDID  Take 1 mg by mouth every 4 (four) hours as needed for moderate pain. Take 1/2 tablet by mouth every four hours as needed for moderate pain; Take one tablet by mouth every four hours as needed for severe pain     isosorbide dinitrate 5 MG tablet  Commonly known as:  ISORDIL  Take 5 mg by mouth 2 (two) times daily.     omeprazole 40 MG capsule  Commonly known as:  PRILOSEC  Take 40 mg by mouth daily.     polyethylene glycol packet  Commonly  known as:  MIRALAX / GLYCOLAX  Take 17 g by mouth daily.     predniSONE 20 MG tablet  Commonly known as:  DELTASONE  Take 10 mg by mouth daily. Take 50mg  daily for 4days, then 40mg  daily for 4days, then 20mg  daily for 4days, then 10mg  daily for 4days then stop.     sodium bicarbonate 650 MG tablet  Take 650 mg by mouth 2 (two) times daily.     Vitamin D (Ergocalciferol) 50000 UNITS Caps capsule  Commonly known as:  DRISDOL  Take 50,000 Units by mouth every 7 (seven) days.         DATA REVIEWED  Laboratory Studies: 02/25/13-WBC 7.8, Hemoglobin 10.1, Hematocrit 30.6, Platelets 304, Na 141, K 5.1, Cl 105, Ca 9.6 02/25/13-BUN 18, Creatinine 2.01, Phosphorous 3.7     Past Medical History  Diagnosis Date  . Hypertension   . Gout      Review of Systems  Constitutional: Negative.   Respiratory: Negative.   Cardiovascular: Negative.   Skin: Negative.   Neurological: Negative.      Physical Exam Filed Vitals:   03/23/13 1531  BP: 133/91  Pulse: 83  Temp: 98.3 F (36.8 C)  Resp:  18   There is no weight on file to calculate BMI. Physical Exam  Constitutional: He is oriented to person, place, and time.  Cardiovascular: Normal rate and regular rhythm.   Pulmonary/Chest: Effort normal and breath sounds normal.  Musculoskeletal: Normal range of motion.  Neurological: He is alert and oriented to person, place, and time.  Skin: Skin is warm and dry.    ASSESSMENT/PLAN  D/C home with HHN, PT, OT, SW DME front wheel walker Referred pt to [facility] for PCP

## 2013-03-31 ENCOUNTER — Encounter: Payer: Self-pay | Admitting: Vascular Surgery

## 2013-04-01 ENCOUNTER — Encounter: Payer: Self-pay | Admitting: Vascular Surgery

## 2013-04-01 ENCOUNTER — Ambulatory Visit (INDEPENDENT_AMBULATORY_CARE_PROVIDER_SITE_OTHER): Payer: Medicaid Other | Admitting: Vascular Surgery

## 2013-04-01 ENCOUNTER — Ambulatory Visit (HOSPITAL_COMMUNITY)
Admission: RE | Admit: 2013-04-01 | Discharge: 2013-04-01 | Disposition: A | Discharge status: Home / Self Care | Payer: Medicaid Other | Source: Ambulatory Visit | Attending: Vascular Surgery | Admitting: Vascular Surgery

## 2013-04-01 VITALS — BP 160/98 | HR 82 | Ht 75.0 in | Wt 211.1 lb

## 2013-04-01 DIAGNOSIS — Z4931 Encounter for adequacy testing for hemodialysis: Secondary | ICD-10-CM | POA: Insufficient documentation

## 2013-04-01 DIAGNOSIS — N186 End stage renal disease: Secondary | ICD-10-CM | POA: Insufficient documentation

## 2013-04-01 NOTE — Progress Notes (Signed)
This is a 58 year old male who underwent first stage basilic vein transposition in his right arm November 11. He developed an infection in one of his vein harvest sites. This is almost healed. He returns today for further followup. He is also here to consider second stage of his basilic vein transposition.   Physical exam:    Filed Vitals:   04/01/13 1028  BP: 160/98  Pulse: 82  Height: 6\' 3"  (1.905 m)  Weight: 211 lb 1.6 oz (95.754 kg)  SpO2: 100%    Right upper extremity: Easily palpable thrill in fistula, longitudinal incision just below the antecubital area is almost healed, he has a skin tear just adjacent to this, hand is warm and well-perfused  Data: Duplex ultrasound of the fistula was performed today. The midsegment has a stenosis diameter is 5-6 mm throughout most of the course of the fistula.  Assessment: Healing infection from vein harvest site, patent fistula.  The patient should be ready for second stage transposition of the fistula in February 17. At that time all also inspect the midsegment of the fistula and if this needs a patch for the narrowed segment we will perform this simultaneously.  Plan: Second stage basilic vein transposition possible revision in February 17. Risks benefits possible palpitations and procedure details were trying to the patient today including but not limited to bleeding infection non-maturation of fistula ischemic steal. He understands and agrees to proceed.    Ruta Hinds, MD [facility] [place] Office: [phone] Pager: [phone]

## 2013-04-02 ENCOUNTER — Other Ambulatory Visit: Payer: Self-pay | Admitting: *Deleted

## 2013-04-05 ENCOUNTER — Other Ambulatory Visit (HOSPITAL_COMMUNITY): Payer: Self-pay | Admitting: Urology

## 2013-04-05 DIAGNOSIS — N2889 Other specified disorders of kidney and ureter: Secondary | ICD-10-CM

## 2013-04-07 ENCOUNTER — Other Ambulatory Visit (HOSPITAL_COMMUNITY): Payer: Self-pay | Admitting: Urology

## 2013-04-07 DIAGNOSIS — N2889 Other specified disorders of kidney and ureter: Secondary | ICD-10-CM

## 2013-04-08 ENCOUNTER — Ambulatory Visit (HOSPITAL_COMMUNITY)
Admission: RE | Admit: 2013-04-08 | Discharge: 2013-04-08 | Disposition: A | Discharge status: Home / Self Care | Payer: Medicaid Other | Source: Ambulatory Visit | Attending: Urology | Admitting: Urology

## 2013-04-08 DIAGNOSIS — N289 Disorder of kidney and ureter, unspecified: Secondary | ICD-10-CM | POA: Insufficient documentation

## 2013-04-08 DIAGNOSIS — N2889 Other specified disorders of kidney and ureter: Secondary | ICD-10-CM

## 2013-04-08 DIAGNOSIS — J984 Other disorders of lung: Secondary | ICD-10-CM | POA: Insufficient documentation

## 2013-04-08 DIAGNOSIS — I700 Atherosclerosis of aorta: Secondary | ICD-10-CM | POA: Insufficient documentation

## 2013-04-08 DIAGNOSIS — J9819 Other pulmonary collapse: Secondary | ICD-10-CM | POA: Insufficient documentation

## 2013-04-09 ENCOUNTER — Encounter (HOSPITAL_COMMUNITY): Payer: Self-pay | Admitting: Pharmacy Technician

## 2013-04-12 ENCOUNTER — Ambulatory Visit (HOSPITAL_COMMUNITY)
Admission: RE | Admit: 2013-04-12 | Discharge: 2013-04-12 | Disposition: A | Discharge status: Home / Self Care | Payer: Medicaid Other | Source: Ambulatory Visit | Attending: Urology | Admitting: Urology

## 2013-04-12 ENCOUNTER — Other Ambulatory Visit: Payer: Self-pay | Admitting: Radiology

## 2013-04-12 DIAGNOSIS — N289 Disorder of kidney and ureter, unspecified: Secondary | ICD-10-CM | POA: Insufficient documentation

## 2013-04-12 DIAGNOSIS — N2889 Other specified disorders of kidney and ureter: Secondary | ICD-10-CM

## 2013-04-12 DIAGNOSIS — N281 Cyst of kidney, acquired: Secondary | ICD-10-CM | POA: Insufficient documentation

## 2013-04-12 DIAGNOSIS — I129 Hypertensive chronic kidney disease with stage 1 through stage 4 chronic kidney disease, or unspecified chronic kidney disease: Secondary | ICD-10-CM | POA: Insufficient documentation

## 2013-04-12 DIAGNOSIS — M109 Gout, unspecified: Secondary | ICD-10-CM | POA: Insufficient documentation

## 2013-04-12 DIAGNOSIS — N189 Chronic kidney disease, unspecified: Secondary | ICD-10-CM | POA: Insufficient documentation

## 2013-04-12 LAB — CBC
HCT: 38.9 % — ABNORMAL LOW (ref 39.0–52.0)
Hemoglobin: 12.4 g/dL — ABNORMAL LOW (ref 13.0–17.0)
MCH: 29 pg (ref 26.0–34.0)
MCHC: 31.9 g/dL (ref 30.0–36.0)
MCV: 91.1 fL (ref 78.0–100.0)
Platelets: 177 10*3/uL (ref 150–400)
RBC: 4.27 MIL/uL (ref 4.22–5.81)
RDW: 19.6 % — ABNORMAL HIGH (ref 11.5–15.5)
WBC: 8 10*3/uL (ref 4.0–10.5)

## 2013-04-12 LAB — BASIC METABOLIC PANEL
BUN: 30 mg/dL — ABNORMAL HIGH (ref 6–23)
CO2: 27 mEq/L (ref 19–32)
Calcium: 9.6 mg/dL (ref 8.4–10.5)
Chloride: 101 mEq/L (ref 96–112)
Creatinine, Ser: 1.88 mg/dL — ABNORMAL HIGH (ref 0.50–1.35)
GFR calc Af Amer: 43 mL/min — ABNORMAL LOW (ref >90)
GFR, EST NON AFRICAN AMERICAN: 37 mL/min — AB (ref >90)
Glucose, Bld: 114 mg/dL — ABNORMAL HIGH (ref 70–99)
POTASSIUM: 5.1 meq/L (ref 3.7–5.3)
SODIUM: 139 meq/L (ref 137–147)

## 2013-04-12 LAB — PROTIME-INR
INR: 0.94 (ref 0.00–1.49)
Prothrombin Time: 12.4 seconds (ref 11.6–15.2)

## 2013-04-12 LAB — APTT: APTT: 25 s (ref 24–37)

## 2013-04-12 MED ORDER — FENTANYL CITRATE 0.05 MG/ML IJ SOLN
INTRAMUSCULAR | Status: AC
  Filled 2013-04-12: qty 4

## 2013-04-12 MED ORDER — FLUMAZENIL 0.5 MG/5ML IV SOLN
INTRAVENOUS | Status: AC
  Filled 2013-04-12: qty 5

## 2013-04-12 MED ORDER — MIDAZOLAM HCL 2 MG/2ML IJ SOLN
INTRAMUSCULAR | Status: AC | PRN
  Administered 2013-04-12: 2 mg via INTRAVENOUS

## 2013-04-12 MED ORDER — SODIUM CHLORIDE 0.9 % IV SOLN
INTRAVENOUS | Status: DC
  Administered 2013-04-12: 11:00:00 via INTRAVENOUS

## 2013-04-12 MED ORDER — MIDAZOLAM HCL 2 MG/2ML IJ SOLN
INTRAMUSCULAR | Status: AC
  Filled 2013-04-12: qty 4

## 2013-04-12 MED ORDER — FENTANYL CITRATE 0.05 MG/ML IJ SOLN
INTRAMUSCULAR | Status: AC | PRN
  Administered 2013-04-12: 100 ug via INTRAVENOUS

## 2013-04-12 MED ORDER — NALOXONE HCL 0.4 MG/ML IJ SOLN
INTRAMUSCULAR | Status: DC
Start: 2013-04-12 — End: 2013-04-12
  Filled 2013-04-12: qty 1

## 2013-04-12 NOTE — Procedures (Signed)
L renal mass Bx 18 g core times 4 No comp

## 2013-04-12 NOTE — Discharge Instructions (Signed)
Kidney Biopsy, Care After Refer to this sheet in the next few weeks. These instructions provide you with information on caring for yourself after your procedure. Your health care provider may also give you more specific instructions. Your treatment has been planned according to current medical practices, but problems sometimes occur. Call your health care provider if you have any problems or questions after your procedure.  WHAT TO EXPECT AFTER THE PROCEDURE   You may notice blood in the urine for the first 24 hours after the biopsy.  You may feel some pain at the biopsy site for 1- 2 weeks after the biopsy. HOME CARE INSTRUCTIONS  Do not lift anything heavier than 10 lb (4.5 kg) for 2 weeks.  Do not take any non-steroidal anti-inflammatory drugs (NSAIDs) or any blood thinners for a week after the biopsy unless instructed to do so by your health care provider.  Only take medicines for pain, fever, or discomfort as directed by your health care provider. SEEK MEDICAL CARE IF:  You have bloody urine more than 24 hours after the biopsy.   You develop a fever.   You cannot urinate.   You have increasing pain at the biopsy site.   You have whitish-yellowish (or pus) drainage at the biopsy site.  You notice any swelling at the biopsy site. SEEK IMMEDIATE MEDICAL CARE IF: You feel faint or dizzy.  Document Released: 10/28/2012 Document Reviewed: 10/28/2012 [facility] Patient Information 2014 [place]. Conscious Sedation, Adult, Care After Refer to this sheet in the next few weeks. These instructions provide you with information on caring for yourself after your procedure. Your health care provider may also give you more specific instructions. Your treatment has been planned according to current medical practices, but problems sometimes occur. Call your health care provider if you have any problems or questions after your procedure. WHAT TO EXPECT AFTER THE PROCEDURE  After your  procedure:  You may feel sleepy, clumsy, and have poor balance for several hours.  Vomiting may occur if you eat too soon after the procedure. HOME CARE INSTRUCTIONS  Do not participate in any activities where you could become injured for at least 24 hours. Do not:  Drive.  Swim.  Ride a bicycle.  Operate heavy machinery.  Cook.  Use power tools.  Climb ladders.  Work from a high place.  Do not make important decisions or sign legal documents until you are improved.  If you vomit, drink water, juice, or soup when you can drink without vomiting. Make sure you have little or no nausea before eating solid foods.  Only take over-the-counter or prescription medicines for pain, discomfort, or fever as directed by your health care provider.  Make sure you and your family fully understand everything about the medicines given to you, including what side effects may occur.  You should not drink alcohol, take sleeping pills, or take medicines that cause drowsiness for at least 24 hours.  If you smoke, do not smoke without supervision.  If you are feeling better, you may resume normal activities 24 hours after you were sedated.  Keep all appointments with your health care provider. SEEK MEDICAL CARE IF:  Your skin is pale or bluish in color.  You continue to feel nauseous or vomit.  Your pain is getting worse and is not helped by medicine.  You have bleeding or swelling.  You are still sleepy or feeling clumsy after 24 hours. SEEK IMMEDIATE MEDICAL CARE IF:  You develop a rash.  You have difficulty breathing.  You develop any type of allergic problem.  You have a fever. MAKE SURE YOU:  Understand these instructions.  Will watch your condition.  Will get help right away if you are not doing well or get worse. Document Released: 12/16/2012 Document Reviewed: 10/02/2012 [facility] Patient Information 2014 [place], [place].

## 2013-04-12 NOTE — H&P (Signed)
Brian Lawson is an 58 y.o. male.   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 He presents today for US guided aspiration/biopsy of the left renal cystic mass.   Past Medical History  Diagnosis Date  . Hypertension   . Gout   . Chronic kidney disease     Past Surgical History  Procedure Laterality Date  . Hernia repair    . Bascilic vein transposition Right 01/18/2013    Procedure: BASCILIC VEIN TRANSPOSITION;  Surgeon: Elam Dutch, MD;  Location: [facility];  Service: Vascular;  Laterality: Right;    No family history on file. Social History:  reports that he has quit smoking. He has never used smokeless tobacco. He reports that he drinks alcohol. He reports that he does not use illicit drugs.  Allergies: No Known Allergies  Current outpatient prescriptions:calcium acetate (PHOSLO) 667 MG capsule, Take 1,334 mg by mouth 3 (three) times daily with meals., Disp: , Rfl: ;  cloNIDine (CATAPRES) 0.2 MG tablet, Take 0.2 mg by mouth 3 (three) times daily., Disp: , Rfl: ;  diclofenac sodium (VOLTAREN) 1 % GEL, Apply 2 g topically 4 (four) times daily as needed (pain)., Disp: , Rfl:  hydrALAZINE (APRESOLINE) 50 MG tablet, Take 50 mg by mouth 3 (three) times daily., Disp: , Rfl: ;  HYDROmorphone (DILAUDID) 2 MG tablet, Take 1 mg by mouth every 4 (four) hours as needed for moderate pain. Take 1/2 tablet by mouth every four hours as needed for moderate pain; Take one tablet by mouth every four hours as needed for severe pain, Disp: , Rfl: ;  omeprazole (PRILOSEC) 40 MG capsule, Take 40 mg by mouth daily., Disp: , Rfl:  predniSONE (DELTASONE) 10 MG tablet, Take 10 mg by mouth daily with breakfast., Disp: , Rfl: ;  sodium bicarbonate 650 MG tablet, Take 650 mg by mouth 2 (two) times daily., Disp: , Rfl: ;  calcitRIOL (ROCALTROL) 0.25 MCG capsule, Take 0.25  mcg by mouth every morning. , Disp: , Rfl: ;  isosorbide dinitrate (ISORDIL) 5 MG tablet, Take 5 mg by mouth 2 (two) times daily., Disp: , Rfl:  Current facility-administered medications:0.9 %  sodium chloride infusion, , Intravenous, Continuous, D Rowe Robert, PA-C, Last Rate: 20 mL/hr at 04/12/13 1128   04/12/13 LABS PENDING Review of Systems  Constitutional: Negative for fever and chills.  Respiratory: Negative for cough.   Cardiovascular: Negative for chest pain.  Gastrointestinal: Negative for nausea, vomiting and abdominal pain.  Genitourinary: Negative for dysuria, hematuria and flank pain.  Musculoskeletal: Negative for back pain.  Neurological: Negative for headaches.  Endo/Heme/Allergies: Does not bruise/bleed easily.    Blood pressure 125/88, pulse 82, temperature 98 F (36.7 C), temperature source Oral, resp. rate 16, height 6\' 3"  (1.905 m), weight 211 lb (95.709 kg), SpO2 99.00%. Physical Exam  Constitutional: He is oriented to person, place, and time. He appears well-developed and well-nourished.  Cardiovascular: Normal rate and regular rhythm.   RUE AVF with good thrill/bruit  Respiratory: Breath sounds normal.  GI: Soft. Bowel sounds are normal. There is no tenderness.  Musculoskeletal: Normal range of motion. He exhibits edema.  Neurological: He is alert and oriented to person, place, and time.     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 He presents  today for US guided aspiration/biopsy of the left renal cystic mass. Details/risks of procedure d/w pt with his understanding and consent.   Brian Lawson,D KEVIN 04/12/2013, 11:35 AM

## 2013-04-21 ENCOUNTER — Encounter (HOSPITAL_COMMUNITY): Payer: Self-pay | Admitting: Pharmacy Technician

## 2013-04-26 ENCOUNTER — Encounter (HOSPITAL_COMMUNITY): Payer: Self-pay | Admitting: *Deleted

## 2013-04-26 ENCOUNTER — Other Ambulatory Visit: Payer: Self-pay | Admitting: Urology

## 2013-04-26 MED ORDER — CEFUROXIME SODIUM 1.5 G IJ SOLR
1.5000 g | INTRAMUSCULAR | Status: AC
  Administered 2013-04-27: 1.5 g via INTRAVENOUS
  Filled 2013-04-26: qty 1.5

## 2013-04-27 ENCOUNTER — Encounter (HOSPITAL_COMMUNITY): Payer: Medicaid Other | Admitting: Anesthesiology

## 2013-04-27 ENCOUNTER — Ambulatory Visit (HOSPITAL_COMMUNITY)
Admission: RE | Admit: 2013-04-27 | Discharge: 2013-04-27 | Disposition: A | Discharge status: Home / Self Care | Payer: Medicaid Other | Source: Ambulatory Visit | Attending: Vascular Surgery | Admitting: Vascular Surgery

## 2013-04-27 ENCOUNTER — Ambulatory Visit (HOSPITAL_COMMUNITY): Payer: Medicaid Other | Admitting: Anesthesiology

## 2013-04-27 ENCOUNTER — Encounter (HOSPITAL_COMMUNITY)
Admission: RE | Disposition: A | Discharge status: Home / Self Care | Payer: Self-pay | Source: Ambulatory Visit | Attending: Vascular Surgery

## 2013-04-27 DIAGNOSIS — N186 End stage renal disease: Secondary | ICD-10-CM | POA: Insufficient documentation

## 2013-04-27 DIAGNOSIS — Z87891 Personal history of nicotine dependence: Secondary | ICD-10-CM | POA: Insufficient documentation

## 2013-04-27 DIAGNOSIS — I120 Hypertensive chronic kidney disease with stage 5 chronic kidney disease or end stage renal disease: Secondary | ICD-10-CM | POA: Insufficient documentation

## 2013-04-27 DIAGNOSIS — T82898A Other specified complication of vascular prosthetic devices, implants and grafts, initial encounter: Secondary | ICD-10-CM | POA: Diagnosis not present

## 2013-04-27 HISTORY — DX: Pneumonia, unspecified organism: J18.9

## 2013-04-27 HISTORY — DX: Inflammatory liver disease, unspecified: K75.9

## 2013-04-27 HISTORY — PX: BASCILIC VEIN TRANSPOSITION: SHX5742

## 2013-04-27 LAB — POCT I-STAT 4, (NA,K, GLUC, HGB,HCT)
GLUCOSE: 130 mg/dL — AB (ref 70–99)
HCT: 40 % (ref 39.0–52.0)
Hemoglobin: 13.6 g/dL (ref 13.0–17.0)
POTASSIUM: 4.9 meq/L (ref 3.7–5.3)
SODIUM: 140 meq/L (ref 137–147)

## 2013-04-27 SURGERY — TRANSPOSITION, VEIN, BASILIC
Anesthesia: General | Site: Arm Upper | Laterality: Right

## 2013-04-27 MED ORDER — LIDOCAINE HCL (CARDIAC) 20 MG/ML IV SOLN
INTRAVENOUS | Status: AC
  Filled 2013-04-27: qty 5

## 2013-04-27 MED ORDER — PROPOFOL 10 MG/ML IV BOLUS
INTRAVENOUS | Status: AC
  Filled 2013-04-27: qty 20

## 2013-04-27 MED ORDER — LIDOCAINE HCL (PF) 1 % IJ SOLN
INTRAMUSCULAR | Status: AC
  Filled 2013-04-27: qty 30

## 2013-04-27 MED ORDER — FENTANYL CITRATE 0.05 MG/ML IJ SOLN
INTRAMUSCULAR | Status: AC
  Filled 2013-04-27: qty 5

## 2013-04-27 MED ORDER — PROTAMINE SULFATE 10 MG/ML IV SOLN
INTRAVENOUS | Status: DC | PRN
  Administered 2013-04-27: 30 mg via INTRAVENOUS
  Administered 2013-04-27: 20 mg via INTRAVENOUS
  Administered 2013-04-27: 10 mg via INTRAVENOUS
  Administered 2013-04-27: 40 mg via INTRAVENOUS

## 2013-04-27 MED ORDER — MIDAZOLAM HCL 5 MG/5ML IJ SOLN
INTRAMUSCULAR | Status: DC | PRN
  Administered 2013-04-27: 2 mg via INTRAVENOUS

## 2013-04-27 MED ORDER — HYDROMORPHONE HCL PF 1 MG/ML IJ SOLN
INTRAMUSCULAR | Status: AC
  Filled 2013-04-27: qty 1

## 2013-04-27 MED ORDER — PHENYLEPHRINE 40 MCG/ML (10ML) SYRINGE FOR IV PUSH (FOR BLOOD PRESSURE SUPPORT)
PREFILLED_SYRINGE | INTRAVENOUS | Status: AC
  Filled 2013-04-27: qty 10

## 2013-04-27 MED ORDER — OXYCODONE HCL 5 MG PO TABS: 5.0000 mg | ORAL_TABLET | Freq: Four times a day (QID) | ORAL | Status: DC | PRN

## 2013-04-27 MED ORDER — LIDOCAINE HCL (CARDIAC) 20 MG/ML IV SOLN
INTRAVENOUS | Status: DC | PRN
  Administered 2013-04-27: 100 mg via INTRAVENOUS

## 2013-04-27 MED ORDER — HEPARIN SODIUM (PORCINE) 1000 UNIT/ML IJ SOLN
INTRAMUSCULAR | Status: DC | PRN
  Administered 2013-04-27: 10000 [IU] via INTRAVENOUS

## 2013-04-27 MED ORDER — EPHEDRINE SULFATE 50 MG/ML IJ SOLN
INTRAMUSCULAR | Status: AC
  Filled 2013-04-27: qty 1

## 2013-04-27 MED ORDER — SODIUM CHLORIDE 0.9 % IR SOLN
Status: DC | PRN
  Administered 2013-04-27: 08:00:00

## 2013-04-27 MED ORDER — HYDROMORPHONE HCL PF 1 MG/ML IJ SOLN
0.2500 mg | INTRAMUSCULAR | Status: DC | PRN
  Administered 2013-04-27: 0.5 mg via INTRAVENOUS

## 2013-04-27 MED ORDER — THROMBIN 20000 UNITS EX SOLR
CUTANEOUS | Status: AC
  Filled 2013-04-27: qty 20000

## 2013-04-27 MED ORDER — OXYCODONE HCL 5 MG PO TABS
5.0000 mg | ORAL_TABLET | Freq: Once | ORAL | Status: AC | PRN
  Administered 2013-04-27: 5 mg via ORAL

## 2013-04-27 MED ORDER — MIDAZOLAM HCL 2 MG/2ML IJ SOLN
INTRAMUSCULAR | Status: AC
  Filled 2013-04-27: qty 2

## 2013-04-27 MED ORDER — OXYCODONE HCL 5 MG PO TABS
ORAL_TABLET | ORAL | Status: AC
  Filled 2013-04-27: qty 1

## 2013-04-27 MED ORDER — ONDANSETRON HCL 4 MG/2ML IJ SOLN: 4.0000 mg | Freq: Once | INTRAMUSCULAR | Status: DC | PRN

## 2013-04-27 MED ORDER — FENTANYL CITRATE 0.05 MG/ML IJ SOLN
INTRAMUSCULAR | Status: DC | PRN
  Administered 2013-04-27 (×2): 50 ug via INTRAVENOUS
  Administered 2013-04-27: 100 ug via INTRAVENOUS

## 2013-04-27 MED ORDER — HEPARIN SODIUM (PORCINE) 1000 UNIT/ML IJ SOLN
INTRAMUSCULAR | Status: AC
  Filled 2013-04-27: qty 1

## 2013-04-27 MED ORDER — SODIUM CHLORIDE 0.9 % IJ SOLN
INTRAMUSCULAR | Status: AC
  Filled 2013-04-27: qty 10

## 2013-04-27 MED ORDER — CEFAZOLIN SODIUM 1-5 GM-% IV SOLN: 1.0000 g | INTRAVENOUS | Status: DC

## 2013-04-27 MED ORDER — OXYCODONE HCL 5 MG/5ML PO SOLN: 5.0000 mg | Freq: Once | ORAL | Status: AC | PRN

## 2013-04-27 MED ORDER — SODIUM CHLORIDE 0.9 % IV SOLN
INTRAVENOUS | Status: DC
  Administered 2013-04-27 (×2): via INTRAVENOUS

## 2013-04-27 MED ORDER — 0.9 % SODIUM CHLORIDE (POUR BTL) OPTIME
TOPICAL | Status: DC | PRN
  Administered 2013-04-27: 1000 mL

## 2013-04-27 MED ORDER — PROPOFOL 10 MG/ML IV BOLUS
INTRAVENOUS | Status: DC | PRN
  Administered 2013-04-27: 200 mg via INTRAVENOUS

## 2013-04-27 SURGICAL SUPPLY — 39 items
ADH SKN CLS APL DERMABOND .7 (GAUZE/BANDAGES/DRESSINGS) ×3
CANISTER SUCTION 2500CC (MISCELLANEOUS) ×2 IMPLANT
CLIP TI MEDIUM 24 (CLIP) ×2 IMPLANT
CLIP TI WIDE RED SMALL 24 (CLIP) ×2 IMPLANT
COVER PROBE W GEL 5X96 (DRAPES) ×2 IMPLANT
COVER SURGICAL LIGHT HANDLE (MISCELLANEOUS) ×2 IMPLANT
DECANTER SPIKE VIAL GLASS SM (MISCELLANEOUS) ×2 IMPLANT
DERMABOND ADVANCED (GAUZE/BANDAGES/DRESSINGS) ×3
DERMABOND ADVANCED .7 DNX12 (GAUZE/BANDAGES/DRESSINGS) ×3 IMPLANT
ELECT REM PT RETURN 9FT ADLT (ELECTROSURGICAL) ×2
ELECTRODE REM PT RTRN 9FT ADLT (ELECTROSURGICAL) ×1 IMPLANT
GEL ULTRASOUND 20GR AQUASONIC (MISCELLANEOUS) IMPLANT
GLOVE BIO SURGEON STRL SZ 6.5 (GLOVE) ×4 IMPLANT
GLOVE BIO SURGEON STRL SZ7.5 (GLOVE) ×2 IMPLANT
GLOVE BIOGEL PI IND STRL 6.5 (GLOVE) IMPLANT
GLOVE BIOGEL PI IND STRL 7.0 (GLOVE) ×1 IMPLANT
GLOVE BIOGEL PI INDICATOR 6.5 (GLOVE) ×1
GLOVE BIOGEL PI INDICATOR 7.0 (GLOVE) ×1
GLOVE SURG SS PI 7.0 STRL IVOR (GLOVE) ×4 IMPLANT
GOWN STRL REUS W/ TWL LRG LVL3 (GOWN DISPOSABLE) ×3 IMPLANT
GOWN STRL REUS W/ TWL XL LVL3 (GOWN DISPOSABLE) IMPLANT
GOWN STRL REUS W/TWL LRG LVL3 (GOWN DISPOSABLE) ×6
GOWN STRL REUS W/TWL XL LVL3 (GOWN DISPOSABLE) ×6
KIT BASIN OR (CUSTOM PROCEDURE TRAY) ×2 IMPLANT
KIT ROOM TURNOVER OR (KITS) ×2 IMPLANT
LOOP VESSEL MINI RED (MISCELLANEOUS) IMPLANT
NS IRRIG 1000ML POUR BTL (IV SOLUTION) ×2 IMPLANT
PACK CV ACCESS (CUSTOM PROCEDURE TRAY) ×2 IMPLANT
PAD ARMBOARD 7.5X6 YLW CONV (MISCELLANEOUS) ×4 IMPLANT
SPONGE SURGIFOAM ABS GEL 100 (HEMOSTASIS) IMPLANT
SUT PROLENE 7 0 BV 1 (SUTURE) ×3 IMPLANT
SUT SILK 2 0 SH (SUTURE) ×2 IMPLANT
SUT VIC AB 3-0 SH 27 (SUTURE) ×10
SUT VIC AB 3-0 SH 27X BRD (SUTURE) ×1 IMPLANT
SUT VICRYL 4-0 PS2 18IN ABS (SUTURE) ×7 IMPLANT
TOWEL OR 17X24 6PK STRL BLUE (TOWEL DISPOSABLE) ×2 IMPLANT
TOWEL OR 17X26 10 PK STRL BLUE (TOWEL DISPOSABLE) ×2 IMPLANT
UNDERPAD 30X30 INCONTINENT (UNDERPADS AND DIAPERS) ×2 IMPLANT
WATER STERILE IRR 1000ML POUR (IV SOLUTION) ×2 IMPLANT

## 2013-04-27 NOTE — Anesthesia Postprocedure Evaluation (Signed)
  Anesthesia Post-op Note  Patient: Brian Lawson  Procedure(s) Performed: Procedure(s): BASCILIC VEIN TRANSPOSITION AND REVISION (Right)  Patient Location: PACU  Anesthesia Type:General  Level of Consciousness: awake, alert  and oriented  Airway and Oxygen Therapy: Patient Spontanous Breathing  Post-op Pain: mild  Post-op Assessment: Post-op Vital signs reviewed, Patient's Cardiovascular Status Stable, Respiratory Function Stable, Patent Airway and Pain level controlled  Post-op Vital Signs: stable  Complications: No apparent anesthesia complications

## 2013-04-27 NOTE — Interval H&P Note (Signed)
History and Physical Interval Note:  04/27/2013 7:27 AM  Brian Lawson  has presented today for surgery, with the diagnosis of End Stage Renal Disease  The various methods of treatment have been discussed with the patient and family. After consideration of risks, benefits and other options for treatment, the patient has consented to  Procedure(s): [place] (Right) as a surgical intervention .  The patient's history has been reviewed, patient examined, no change in status, stable for surgery.  I have reviewed the patient's chart and labs.  Questions were answered to the patient's satisfaction.     FIELDS,CHARLES E

## 2013-04-27 NOTE — Transfer of Care (Signed)
Immediate Anesthesia Transfer of Care Note  Patient: Brian Lawson  Procedure(s) Performed: Procedure(s): [place] (Right)  Patient Location: PACU  Anesthesia Type:General  Level of Consciousness: awake, alert  and oriented  Airway & Oxygen Therapy: Patient Spontanous Breathing and Patient connected to nasal cannula oxygen  Post-op Assessment: Report given to PACU RN and Post -op Vital signs reviewed and stable  Post vital signs: Reviewed and stable  Complications: No apparent anesthesia complications

## 2013-04-27 NOTE — Anesthesia Procedure Notes (Signed)
Procedure Name: LMA Insertion Date/Time: 04/27/2013 7:38 AM Performed by: Mariea Clonts Pre-anesthesia Checklist: Patient identified, Timeout performed, Emergency Drugs available, Suction available and Patient being monitored Patient Re-evaluated:Patient Re-evaluated prior to inductionOxygen Delivery Method: Circle system utilized Preoxygenation: Pre-oxygenation with 100% oxygen Intubation Type: IV induction LMA: LMA inserted LMA Size: 5.0 Number of attempts: 1 Placement Confirmation: positive ETCO2 and breath sounds checked- equal and bilateral Tube secured with: Tape Dental Injury: Teeth and Oropharynx as per pre-operative assessment

## 2013-04-27 NOTE — Discharge Instructions (Signed)
° ° °  04/27/2013 Brian Lawson [id number] May 17, 1955  Surgeon(s): Elam Dutch, MD  Procedure(s): BASCILIC VEIN TRANSPOSITION AND REVISION-right  x Do not stick graft for 12 weeks     What to eat:  For your first meals, you should eat lightly; only small meals initially.  If you do not have nausea, you may eat larger meals.  Avoid spicy, greasy and heavy food.    General Anesthesia, Adult, Care After  Refer to this sheet in the next few weeks. These instructions provide you with information on caring for yourself after your procedure. Your health care provider may also give you more specific instructions. Your treatment has been planned according to current medical practices, but problems sometimes occur. Call your health care provider if you have any problems or questions after your procedure.  WHAT TO EXPECT AFTER THE PROCEDURE  After the procedure, it is typical to experience:  Sleepiness.  Nausea and vomiting. HOME CARE INSTRUCTIONS  For the first 24 hours after general anesthesia:  Have a responsible person with you.  Do not drive a car. If you are alone, do not take public transportation.  Do not drink alcohol.  Do not take medicine that has not been prescribed by your health care provider.  Do not sign important papers or make important decisions.  You may resume a normal diet and activities as directed by your health care provider.  Change bandages (dressings) as directed.  If you have questions or problems that seem related to general anesthesia, call the hospital and ask for the anesthetist or anesthesiologist on call. SEEK MEDICAL CARE IF:  You have nausea and vomiting that continue the day after anesthesia.  You develop a rash. SEEK IMMEDIATE MEDICAL CARE IF:  You have difficulty breathing.  You have chest pain.  You have any allergic problems. Document Released: 06/03/2000 Document Revised: 10/28/2012 Document Reviewed: 09/10/2012  [facility] Patient  Information 2014 [place], [place].

## 2013-04-27 NOTE — Op Note (Signed)
Procedure: Revision of right basilic vein transposition fistula  Preoperative diagnosis: End-stage renal disease  Postoperative diagnosis: Same  Anesthesia: Gen.  Asst.: Leontine Locket, PA-c  Operative findings: 3-6 mm basilic vein  Operative details: After obtaining informed consent, the patient was taken to the operating room. The patient was placed in supine position on the operating room table. After induction of general anesthesia the patient's entire right upper extremity was prepped and draped in usual sterile fashion. A longitudinal incision was made through a pre-existing scar near the right antecubital area. The incision was carried down through the subcutaneous tissues down to the level of the fistula adjacent to the anastomosis. The fistula at this level was approximately 3 mm in diameter. There is an easily palpable thrill within it. I then proceeded to completely mobilize the basilic vein all the way up to the level of the axilla through several skip incisions. The vein was approximate 6-8 mm in diameter at the level of the axilla. The initial intent was to only superficialize the vein. However, there were several sensory nerves tethering the vein in order to bring this vein closer to the surface I found that it was going to be impossible to do this without transecting the fistula and then re-anastomosing it. The patient was given 10000 units of intravenous heparin. Vessel loops were used to control the proximal fistula after appropriate circulation time of the heparin. The fistula was then disconnected in the axilla. The vein was gently distended with heparinized saline and marked for orientation. It was then brought to a more superficial position by tunneling it subcutaneously over the biceps.  The vein was also removed from its tethered segment below the sensory nerves. An end-to-end anastomosis was then created using a running 7-0 Prolene suture vein to vein in the axilla. Just prior  completion of the anastomosis the fistula was fore bled and backbled and thoroughly flushed.   The anastomosis was secured, vessel loops released, and there was good flow into the fistula immediately with a palpable thrill. Hemostasis was obtained. Several 3-0 Vicryl sutures were placed in the subcutaneous tissues in each incision. All incisions were closed with a 4-0 Vicryl subcuticular stitch with the skin. Dermabond was applied all incisions. Patient tolerated the procedure well and there were no complications. Instrument sponge and needle counts were correct at the end of the case. Patient had a palpable radial pulse at the end of the case.   Ruta Hinds, MD [facility] Office: [phone] Pager: [phone]

## 2013-04-27 NOTE — Preoperative (Signed)
Beta Blockers   Reason not to administer Beta Blockers:Not Applicable 

## 2013-04-27 NOTE — Anesthesia Preprocedure Evaluation (Signed)
Anesthesia Evaluation  Patient identified by MRN, date of birth, ID band Patient awake    Reviewed: Allergy & Precautions, H&P , NPO status , Patient's Chart, lab work & pertinent test results  Airway Mallampati: II TM Distance: >3 FB Neck ROM: Full    Dental  (+) Teeth Intact, Dental Advisory Given   Pulmonary former smoker,  breath sounds clear to auscultation        Cardiovascular hypertension, Rhythm:Regular Rate:Normal     Neuro/Psych    GI/Hepatic   Endo/Other    Renal/GU      Musculoskeletal   Abdominal   Peds  Hematology   Anesthesia Other Findings   Reproductive/Obstetrics                           Anesthesia Physical Anesthesia Plan  ASA: II  Anesthesia Plan: General   Post-op Pain Management:    Induction: Intravenous  Airway Management Planned: LMA  Additional Equipment:   Intra-op Plan:   Post-operative Plan:   Informed Consent: I have reviewed the patients History and Physical, chart, labs and discussed the procedure including the risks, benefits and alternatives for the proposed anesthesia with the patient or authorized representative who has indicated his/her understanding and acceptance.   Dental advisory given  Plan Discussed with: CRNA and Anesthesiologist  Anesthesia Plan Comments: (Plan GA with LMA  Roberts Gaudy, MD)        Anesthesia Quick Evaluation

## 2013-04-27 NOTE — H&P (View-Only) (Signed)
This is a 58 year old male who underwent first stage basilic vein transposition in his right arm November 11. He developed an infection in one of his vein harvest sites. This is almost healed. He returns today for further followup. He is also here to consider second stage of his basilic vein transposition.   Physical exam:    Filed Vitals:   04/01/13 1028  BP: 160/98  Pulse: 82  Height: 6\' 3"  (1.905 m)  Weight: 211 lb 1.6 oz (95.754 kg)  SpO2: 100%    Right upper extremity: Easily palpable thrill in fistula, longitudinal incision just below the antecubital area is almost healed, he has a skin tear just adjacent to this, hand is warm and well-perfused  Data: Duplex ultrasound of the fistula was performed today. The midsegment has a stenosis diameter is 5-6 mm throughout most of the course of the fistula.  Assessment: Healing infection from vein harvest site, patent fistula.  The patient should be ready for second stage transposition of the fistula in February 17. At that time all also inspect the midsegment of the fistula and if this needs a patch for the narrowed segment we will perform this simultaneously.  Plan: Second stage basilic vein transposition possible revision in February 17. Risks benefits possible palpitations and procedure details were trying to the patient today including but not limited to bleeding infection non-maturation of fistula ischemic steal. He understands and agrees to proceed.    Ruta Hinds, MD [facility] Office: [phone] Pager: [phone]

## 2013-04-28 ENCOUNTER — Telehealth: Payer: Self-pay | Admitting: Vascular Surgery

## 2013-04-28 NOTE — Telephone Encounter (Addendum)
Message copied by Gena Fray on Wed Apr 28, 2013  4:05 PM ------      Message from: Mena Goes      Created: Tue Apr 27, 2013 10:01 AM      Regarding: Schedule                   ----- Message -----         From: Gabriel Earing, PA-C         Sent: 04/27/2013   9:56 AM           To: [facility] Charge Pool            S/p 2nd stage BVT 04/27/13.  F/u with Dr. Oneida Alar in 2 weeks.            Thanks,      Samantha ------ 04/28/13: spoke with pt,dpm

## 2013-04-30 ENCOUNTER — Encounter (HOSPITAL_COMMUNITY): Payer: Self-pay | Admitting: Vascular Surgery

## 2013-05-12 ENCOUNTER — Encounter: Payer: Self-pay | Admitting: Vascular Surgery

## 2013-05-13 ENCOUNTER — Encounter: Payer: Self-pay | Admitting: Vascular Surgery

## 2013-05-13 ENCOUNTER — Ambulatory Visit (INDEPENDENT_AMBULATORY_CARE_PROVIDER_SITE_OTHER): Payer: Medicaid Other | Admitting: Vascular Surgery

## 2013-05-13 VITALS — BP 135/92 | HR 88 | Temp 98.0°F | Ht 75.0 in | Wt 220.8 lb

## 2013-05-13 DIAGNOSIS — N186 End stage renal disease: Secondary | ICD-10-CM

## 2013-05-13 NOTE — Progress Notes (Signed)
This is a 58 year old male who underwent first stage basilic vein transposition in his right arm November 11. He developed an infection in one of his vein harvest sites. This eventually healed. He returns today for followup after second stage procedure of his basilic vein transposition in February 17. He returns today for further followup.   Physical exam:  Filed Vitals:   05/13/13 0836  BP: 135/92  Pulse: 88  Temp: 98 F (36.7 C)  TempSrc: Oral  Height: 6\' 3"  (1.905 m)  Weight: 220 lb 12.8 oz (100.154 kg)  SpO2: 97%   Right upper extremity: Easily palpable thrill in fistula, longitudinal incision just below the antecubital area is almost healed, he has a small amount of drainage from the antecubital incision, hand is warm and well-perfused    Assessment:  patent fistula.  slowly healing incision  Plan: The patient will followup in 6 weeks to make sure that the fistula has matured and all wounds are healed   Ruta Hinds, MD [facility]: [phone] Pager: [phone]

## 2013-05-13 NOTE — Addendum Note (Signed)
Addended by: Mena Goes on: 05/13/2013 04:09 PM   Modules accepted: Orders

## 2013-05-14 NOTE — Progress Notes (Signed)
Surgery on 05/28/13.  Preop on 3/13 at 230pm.  Need orders in EPIC.  Thank You.

## 2013-05-17 NOTE — Progress Notes (Signed)
Surgery on 05/28/13.  Preop on 05/21/13 at 230pm.  Need orders in EPIC.  Thank You.

## 2013-05-18 ENCOUNTER — Encounter (HOSPITAL_COMMUNITY): Payer: Self-pay | Admitting: Pharmacy Technician

## 2013-05-21 ENCOUNTER — Encounter (HOSPITAL_COMMUNITY): Payer: Self-pay

## 2013-05-21 ENCOUNTER — Encounter (HOSPITAL_COMMUNITY)
Admission: RE | Admit: 2013-05-21 | Discharge: 2013-05-21 | Disposition: A | Discharge status: Home / Self Care | Payer: Medicaid Other | Source: Ambulatory Visit | Attending: Urology | Admitting: Urology

## 2013-05-21 DIAGNOSIS — Z01812 Encounter for preprocedural laboratory examination: Secondary | ICD-10-CM | POA: Insufficient documentation

## 2013-05-21 HISTORY — DX: Unspecified osteoarthritis, unspecified site: M19.90

## 2013-05-21 LAB — COMPREHENSIVE METABOLIC PANEL
ALBUMIN: 3.2 g/dL — AB (ref 3.5–5.2)
ALT: 28 U/L (ref 0–53)
AST: 20 U/L (ref 0–37)
Alkaline Phosphatase: 52 U/L (ref 39–117)
BUN: 28 mg/dL — ABNORMAL HIGH (ref 6–23)
CO2: 30 mEq/L (ref 19–32)
CREATININE: 2.08 mg/dL — AB (ref 0.50–1.35)
Calcium: 9.7 mg/dL (ref 8.4–10.5)
Chloride: 98 mEq/L (ref 96–112)
GFR calc Af Amer: 38 mL/min — ABNORMAL LOW (ref >90)
GFR calc non Af Amer: 33 mL/min — ABNORMAL LOW (ref >90)
Glucose, Bld: 100 mg/dL — ABNORMAL HIGH (ref 70–99)
Potassium: 5.8 mEq/L — ABNORMAL HIGH (ref 3.7–5.3)
SODIUM: 137 meq/L (ref 137–147)
Total Protein: 7.2 g/dL (ref 6.0–8.3)

## 2013-05-21 LAB — CBC
HCT: 40.7 % (ref 39.0–52.0)
Hemoglobin: 12.7 g/dL — ABNORMAL LOW (ref 13.0–17.0)
MCH: 29 pg (ref 26.0–34.0)
MCHC: 31.2 g/dL (ref 30.0–36.0)
MCV: 92.9 fL (ref 78.0–100.0)
PLATELETS: 164 10*3/uL (ref 150–400)
RBC: 4.38 MIL/uL (ref 4.22–5.81)
RDW: 15.9 % — AB (ref 11.5–15.5)
WBC: 9.9 10*3/uL (ref 4.0–10.5)

## 2013-05-21 LAB — ABO/RH: ABO/RH(D): A POS

## 2013-05-21 NOTE — Pre-Procedure Instructions (Signed)
CT CHEST REPORT1/29/15 AND EKG REPORT 04/27/13 IN EPIC.

## 2013-05-21 NOTE — Patient Instructions (Addendum)
FOLLOW YOUR BOWEL PREP INSTRUCTIONS FROM DR. HERRICK'S OFFICE DAY BEFORE YOUR SURGERY.  YOUR SURGERY IS SCHEDULED AT [facility]  ON:   Friday 3/20  REPORT TO  SHORT STAY CENTER AT:  5:30 AM      PHONE # FOR SHORT STAY IS [phone]  DO NOT EAT OR DRINK ANYTHING AFTER MIDNIGHT THE NIGHT BEFORE YOUR SURGERY.  YOU MAY BRUSH YOUR TEETH, RINSE OUT YOUR MOUTH--BUT NO WATER, NO FOOD, NO CHEWING GUM, NO MINTS, NO CANDIES, NO CHEWING TOBACCO.  PLEASE TAKE THE FOLLOWING MEDICATIONS THE AM OF YOUR SURGERY WITH A FEW SIPS OF WATER:  CLONIDINE, HYDRALAZINE, ISOSORBIDE, OMEPRAZOLE, PREDNISONE, OXYCODONE IF NEEDED FOR PAIN.   DO NOT BRING VALUABLES, MONEY, CREDIT CARDS.  DO NOT WEAR JEWELRY, MAKE-UP, NAIL POLISH AND NO METAL PINS OR CLIPS IN YOUR HAIR. CONTACT LENS, DENTURES / PARTIALS, GLASSES SHOULD NOT BE WORN TO SURGERY AND IN MOST CASES-HEARING AIDS WILL NEED TO BE REMOVED.  BRING YOUR GLASSES CASE, ANY EQUIPMENT NEEDED FOR YOUR CONTACT LENS. FOR PATIENTS ADMITTED TO THE HOSPITAL--CHECK OUT TIME THE DAY OF DISCHARGE IS 11:00 AM.  ALL INPATIENT ROOMS ARE PRIVATE - WITH BATHROOM, TELEPHONE, TELEVISION AND WIFI INTERNET.                                                    PLEASE READ OVER ANY  FACT SHEETS THAT YOU WERE GIVEN:   BLOOD TRANSFUSION FACT SHEET  FAILURE TO FOLLOW THESE INSTRUCTIONS MAY RESULT IN THE CANCELLATION OF YOUR SURGERY. PLEASE BE AWARE THAT YOU MAY NEED ADDITIONAL BLOOD DRAWN DAY OF YOUR SURGERY  PATIENT SIGNATURE_________________________________

## 2013-05-23 LAB — URINE CULTURE
COLONY COUNT: NO GROWTH
Culture: NO GROWTH

## 2013-05-24 NOTE — Pre-Procedure Instructions (Signed)
PT'S PREOP CMET REPORT FAXED TO DR. HERRICK'S OFFICE FOR REVIEW OF ABNORMALS.

## 2013-05-28 ENCOUNTER — Inpatient Hospital Stay (HOSPITAL_COMMUNITY)
Admission: RE | Admit: 2013-05-28 | Discharge: 2013-06-01 | DRG: 657 | Disposition: A | Discharge status: Home / Self Care | Payer: Medicaid Other | Source: Ambulatory Visit | Attending: Urology | Admitting: Urology

## 2013-05-28 ENCOUNTER — Encounter (HOSPITAL_COMMUNITY)
Admission: RE | Disposition: A | Discharge status: Home / Self Care | Payer: Self-pay | Source: Ambulatory Visit | Attending: Urology

## 2013-05-28 ENCOUNTER — Encounter (HOSPITAL_COMMUNITY): Payer: Medicaid Other | Admitting: Registered Nurse

## 2013-05-28 ENCOUNTER — Inpatient Hospital Stay (HOSPITAL_COMMUNITY): Payer: Medicaid Other | Admitting: Registered Nurse

## 2013-05-28 ENCOUNTER — Encounter (HOSPITAL_COMMUNITY): Payer: Self-pay | Admitting: *Deleted

## 2013-05-28 DIAGNOSIS — N183 Chronic kidney disease, stage 3 unspecified: Secondary | ICD-10-CM

## 2013-05-28 DIAGNOSIS — E875 Hyperkalemia: Secondary | ICD-10-CM | POA: Diagnosis not present

## 2013-05-28 DIAGNOSIS — N289 Disorder of kidney and ureter, unspecified: Secondary | ICD-10-CM

## 2013-05-28 DIAGNOSIS — Z87891 Personal history of nicotine dependence: Secondary | ICD-10-CM

## 2013-05-28 DIAGNOSIS — D62 Acute posthemorrhagic anemia: Secondary | ICD-10-CM | POA: Diagnosis not present

## 2013-05-28 DIAGNOSIS — M1990 Unspecified osteoarthritis, unspecified site: Secondary | ICD-10-CM | POA: Diagnosis present

## 2013-05-28 DIAGNOSIS — E872 Acidosis, unspecified: Secondary | ICD-10-CM

## 2013-05-28 DIAGNOSIS — M109 Gout, unspecified: Secondary | ICD-10-CM | POA: Diagnosis present

## 2013-05-28 DIAGNOSIS — I10 Essential (primary) hypertension: Secondary | ICD-10-CM

## 2013-05-28 DIAGNOSIS — C649 Malignant neoplasm of unspecified kidney, except renal pelvis: Principal | ICD-10-CM | POA: Diagnosis present

## 2013-05-28 DIAGNOSIS — N039 Chronic nephritic syndrome with unspecified morphologic changes: Secondary | ICD-10-CM

## 2013-05-28 DIAGNOSIS — N186 End stage renal disease: Secondary | ICD-10-CM

## 2013-05-28 DIAGNOSIS — Z79899 Other long term (current) drug therapy: Secondary | ICD-10-CM

## 2013-05-28 DIAGNOSIS — I129 Hypertensive chronic kidney disease with stage 1 through stage 4 chronic kidney disease, or unspecified chronic kidney disease: Secondary | ICD-10-CM | POA: Diagnosis present

## 2013-05-28 DIAGNOSIS — R000 Tachycardia, unspecified: Secondary | ICD-10-CM | POA: Diagnosis present

## 2013-05-28 DIAGNOSIS — N179 Acute kidney failure, unspecified: Secondary | ICD-10-CM | POA: Diagnosis not present

## 2013-05-28 DIAGNOSIS — IMO0002 Reserved for concepts with insufficient information to code with codable children: Secondary | ICD-10-CM

## 2013-05-28 DIAGNOSIS — K759 Inflammatory liver disease, unspecified: Secondary | ICD-10-CM | POA: Diagnosis present

## 2013-05-28 DIAGNOSIS — Z8249 Family history of ischemic heart disease and other diseases of the circulatory system: Secondary | ICD-10-CM

## 2013-05-28 DIAGNOSIS — D631 Anemia in chronic kidney disease: Secondary | ICD-10-CM | POA: Diagnosis present

## 2013-05-28 DIAGNOSIS — N2889 Other specified disorders of kidney and ureter: Secondary | ICD-10-CM

## 2013-05-28 DIAGNOSIS — Z85528 Personal history of other malignant neoplasm of kidney: Secondary | ICD-10-CM | POA: Diagnosis present

## 2013-05-28 HISTORY — PX: LAPAROSCOPIC NEPHRECTOMY: SHX1930

## 2013-05-28 LAB — BASIC METABOLIC PANEL
BUN: 43 mg/dL — ABNORMAL HIGH (ref 6–23)
CHLORIDE: 99 meq/L (ref 96–112)
CO2: 21 mEq/L (ref 19–32)
Calcium: 8.4 mg/dL (ref 8.4–10.5)
Creatinine, Ser: 2.1 mg/dL — ABNORMAL HIGH (ref 0.50–1.35)
GFR calc Af Amer: 38 mL/min — ABNORMAL LOW (ref >90)
GFR, EST NON AFRICAN AMERICAN: 33 mL/min — AB (ref >90)
Glucose, Bld: 173 mg/dL — ABNORMAL HIGH (ref 70–99)
POTASSIUM: 4.9 meq/L (ref 3.7–5.3)
SODIUM: 137 meq/L (ref 137–147)

## 2013-05-28 LAB — CBC
HCT: 40.2 % (ref 39.0–52.0)
Hemoglobin: 12.9 g/dL — ABNORMAL LOW (ref 13.0–17.0)
MCH: 29.5 pg (ref 26.0–34.0)
MCHC: 32.1 g/dL (ref 30.0–36.0)
MCV: 92 fL (ref 78.0–100.0)
PLATELETS: 134 10*3/uL — AB (ref 150–400)
RBC: 4.37 MIL/uL (ref 4.22–5.81)
RDW: 16.5 % — AB (ref 11.5–15.5)
WBC: 11.1 10*3/uL — AB (ref 4.0–10.5)

## 2013-05-28 LAB — POCT I-STAT 4, (NA,K, GLUC, HGB,HCT)
Glucose, Bld: 143 mg/dL — ABNORMAL HIGH (ref 70–99)
HEMATOCRIT: 40 % (ref 39.0–52.0)
Hemoglobin: 13.6 g/dL (ref 13.0–17.0)
Potassium: 4.7 mEq/L (ref 3.7–5.3)
SODIUM: 137 meq/L (ref 137–147)

## 2013-05-28 LAB — TYPE AND SCREEN
ABO/RH(D): A POS
Antibody Screen: NEGATIVE

## 2013-05-28 SURGERY — NEPHRECTOMY, RADICAL, LAPAROSCOPIC, ADULT
Anesthesia: General | Laterality: Left

## 2013-05-28 MED ORDER — PROPOFOL 10 MG/ML IV BOLUS
INTRAVENOUS | Status: AC
Start: 2013-05-28 — End: 2013-05-28
  Filled 2013-05-28: qty 20

## 2013-05-28 MED ORDER — FENTANYL CITRATE 0.05 MG/ML IJ SOLN
INTRAMUSCULAR | Status: AC
  Filled 2013-05-28: qty 5

## 2013-05-28 MED ORDER — SODIUM CHLORIDE 0.9 % IJ SOLN
INTRAMUSCULAR | Status: AC
  Filled 2013-05-28: qty 20

## 2013-05-28 MED ORDER — HEPARIN SODIUM (PORCINE) 5000 UNIT/ML IJ SOLN
5000.0000 [IU] | Freq: Two times a day (BID) | INTRAMUSCULAR | Status: DC
  Administered 2013-05-28 – 2013-05-31 (×7): 5000 [IU] via SUBCUTANEOUS
  Filled 2013-05-28 (×8): qty 1

## 2013-05-28 MED ORDER — PREDNISONE 10 MG PO TABS
10.0000 mg | ORAL_TABLET | Freq: Every day | ORAL | Status: DC
  Administered 2013-05-29 – 2013-05-30 (×2): 10 mg via ORAL
  Filled 2013-05-28 (×3): qty 1

## 2013-05-28 MED ORDER — HYDROMORPHONE HCL PF 2 MG/ML IJ SOLN
INTRAMUSCULAR | Status: AC
  Filled 2013-05-28: qty 1

## 2013-05-28 MED ORDER — ONDANSETRON HCL 4 MG PO TABS
4.0000 mg | ORAL_TABLET | Freq: Three times a day (TID) | ORAL | Status: DC | PRN
  Administered 2013-05-29 – 2013-05-30 (×4): 4 mg via ORAL
  Filled 2013-05-28 (×4): qty 1

## 2013-05-28 MED ORDER — LABETALOL HCL 5 MG/ML IV SOLN
20.0000 mg | Freq: Once | INTRAVENOUS | Status: AC
  Administered 2013-05-28: 20 mg via INTRAVENOUS

## 2013-05-28 MED ORDER — GLYCOPYRROLATE 0.2 MG/ML IJ SOLN
INTRAMUSCULAR | Status: AC
  Filled 2013-05-28: qty 3

## 2013-05-28 MED ORDER — EPHEDRINE SULFATE 50 MG/ML IJ SOLN
INTRAMUSCULAR | Status: DC | PRN
  Administered 2013-05-28 (×2): 10 mg via INTRAVENOUS
  Administered 2013-05-28: 15 mg via INTRAVENOUS

## 2013-05-28 MED ORDER — LACTATED RINGERS IV SOLN
INTRAVENOUS | Status: DC
  Administered 2013-05-28: 1000 mL via INTRAVENOUS

## 2013-05-28 MED ORDER — SODIUM BICARBONATE 650 MG PO TABS
650.0000 mg | ORAL_TABLET | Freq: Two times a day (BID) | ORAL | Status: DC
  Administered 2013-05-28 – 2013-06-01 (×8): 650 mg via ORAL
  Filled 2013-05-28 (×9): qty 1

## 2013-05-28 MED ORDER — LABETALOL HCL 5 MG/ML IV SOLN
INTRAVENOUS | Status: AC
  Filled 2013-05-28: qty 4

## 2013-05-28 MED ORDER — CEFAZOLIN SODIUM-DEXTROSE 2-3 GM-% IV SOLR
INTRAVENOUS | Status: AC
  Filled 2013-05-28: qty 50

## 2013-05-28 MED ORDER — PHENYLEPHRINE HCL 10 MG/ML IJ SOLN
INTRAMUSCULAR | Status: DC | PRN
  Administered 2013-05-28 (×2): 80 ug via INTRAVENOUS

## 2013-05-28 MED ORDER — PANTOPRAZOLE SODIUM 40 MG PO TBEC
40.0000 mg | DELAYED_RELEASE_TABLET | Freq: Every day | ORAL | Status: DC
  Administered 2013-05-29 – 2013-06-01 (×4): 40 mg via ORAL
  Filled 2013-05-28 (×4): qty 1

## 2013-05-28 MED ORDER — HYDRALAZINE HCL 20 MG/ML IJ SOLN
5.0000 mg | INTRAMUSCULAR | Status: DC | PRN
  Filled 2013-05-28: qty 0.25

## 2013-05-28 MED ORDER — PHENYLEPHRINE 40 MCG/ML (10ML) SYRINGE FOR IV PUSH (FOR BLOOD PRESSURE SUPPORT)
PREFILLED_SYRINGE | INTRAVENOUS | Status: AC
  Filled 2013-05-28: qty 10

## 2013-05-28 MED ORDER — PROMETHAZINE HCL 25 MG/ML IJ SOLN: 6.2500 mg | INTRAMUSCULAR | Status: DC | PRN

## 2013-05-28 MED ORDER — BUPIVACAINE-EPINEPHRINE 0.25% -1:200000 IJ SOLN
INTRAMUSCULAR | Status: DC | PRN
  Administered 2013-05-28: 17 mL

## 2013-05-28 MED ORDER — CEFAZOLIN SODIUM-DEXTROSE 2-3 GM-% IV SOLR
2.0000 g | INTRAVENOUS | Status: AC
  Administered 2013-05-28: 2 g via INTRAVENOUS

## 2013-05-28 MED ORDER — MEPERIDINE HCL 50 MG/ML IJ SOLN: 6.2500 mg | INTRAMUSCULAR | Status: DC | PRN

## 2013-05-28 MED ORDER — KETAMINE HCL 10 MG/ML IJ SOLN
INTRAMUSCULAR | Status: AC
  Filled 2013-05-28: qty 1

## 2013-05-28 MED ORDER — PROPOFOL 10 MG/ML IV BOLUS
INTRAVENOUS | Status: DC | PRN
  Administered 2013-05-28: 150 mg via INTRAVENOUS

## 2013-05-28 MED ORDER — ONDANSETRON HCL 4 MG/2ML IJ SOLN
INTRAMUSCULAR | Status: AC
  Filled 2013-05-28: qty 2

## 2013-05-28 MED ORDER — DEXAMETHASONE SODIUM PHOSPHATE 4 MG/ML IJ SOLN
INTRAMUSCULAR | Status: DC | PRN
  Administered 2013-05-28: 10 mg via INTRAVENOUS

## 2013-05-28 MED ORDER — LACTATED RINGERS IR SOLN
Status: DC | PRN
  Administered 2013-05-28: 200 mL

## 2013-05-28 MED ORDER — HEMOSTATIC AGENTS (NO CHARGE) OPTIME
TOPICAL | Status: DC | PRN
  Administered 2013-05-28: 1 via TOPICAL

## 2013-05-28 MED ORDER — SODIUM CHLORIDE 0.9 % IV SOLN
INTRAVENOUS | Status: DC | PRN
  Administered 2013-05-28 (×3): via INTRAVENOUS

## 2013-05-28 MED ORDER — CISATRACURIUM BESYLATE (PF) 10 MG/5ML IV SOLN
INTRAVENOUS | Status: DC | PRN
  Administered 2013-05-28: 4 mg via INTRAVENOUS
  Administered 2013-05-28: 2 mg via INTRAVENOUS
  Administered 2013-05-28: 10 mg via INTRAVENOUS
  Administered 2013-05-28: 4 mg via INTRAVENOUS
  Administered 2013-05-28 (×3): 2 mg via INTRAVENOUS
  Administered 2013-05-28: 10 mg via INTRAVENOUS

## 2013-05-28 MED ORDER — SODIUM CHLORIDE 0.9 % IV SOLN
INTRAVENOUS | Status: DC
  Administered 2013-05-28: 1000 mL via INTRAVENOUS
  Administered 2013-05-29: 11:00:00 via INTRAVENOUS
  Administered 2013-05-29: 100 mL/h via INTRAVENOUS
  Administered 2013-05-30 – 2013-05-31 (×3): via INTRAVENOUS

## 2013-05-28 MED ORDER — HYDRALAZINE HCL 20 MG/ML IJ SOLN: INTRAMUSCULAR | Status: DC | PRN

## 2013-05-28 MED ORDER — ONDANSETRON HCL 4 MG/2ML IJ SOLN
INTRAMUSCULAR | Status: DC | PRN
Start: 2013-05-28 — End: 2013-05-28
  Administered 2013-05-28: 4 mg via INTRAVENOUS

## 2013-05-28 MED ORDER — MIDAZOLAM HCL 2 MG/2ML IJ SOLN
INTRAMUSCULAR | Status: AC
  Filled 2013-05-28: qty 2

## 2013-05-28 MED ORDER — KETAMINE HCL 10 MG/ML IJ SOLN
INTRAMUSCULAR | Status: DC | PRN
  Administered 2013-05-28: 10 mg via INTRAVENOUS
  Administered 2013-05-28: 30 mg via INTRAVENOUS
  Administered 2013-05-28 (×3): 10 mg via INTRAVENOUS

## 2013-05-28 MED ORDER — NEOSTIGMINE METHYLSULFATE 1 MG/ML IJ SOLN
INTRAMUSCULAR | Status: AC
  Filled 2013-05-28: qty 10

## 2013-05-28 MED ORDER — CISATRACURIUM BESYLATE 20 MG/10ML IV SOLN
INTRAVENOUS | Status: AC
  Filled 2013-05-28: qty 20

## 2013-05-28 MED ORDER — HYDROMORPHONE HCL PF 1 MG/ML IJ SOLN: 0.2500 mg | INTRAMUSCULAR | Status: DC | PRN

## 2013-05-28 MED ORDER — BISACODYL 10 MG RE SUPP: 10.0000 mg | Freq: Every day | RECTAL | Status: DC | PRN

## 2013-05-28 MED ORDER — GLYCOPYRROLATE 0.2 MG/ML IJ SOLN
INTRAMUSCULAR | Status: DC | PRN
  Administered 2013-05-28: 0.6 mg via INTRAVENOUS

## 2013-05-28 MED ORDER — BUPIVACAINE-EPINEPHRINE PF 0.25-1:200000 % IJ SOLN
INTRAMUSCULAR | Status: AC
  Filled 2013-05-28: qty 30

## 2013-05-28 MED ORDER — DEXAMETHASONE SODIUM PHOSPHATE 10 MG/ML IJ SOLN
INTRAMUSCULAR | Status: AC
  Filled 2013-05-28: qty 1

## 2013-05-28 MED ORDER — NEOSTIGMINE METHYLSULFATE 1 MG/ML IJ SOLN
INTRAMUSCULAR | Status: DC | PRN
  Administered 2013-05-28: 5 mg via INTRAVENOUS

## 2013-05-28 MED ORDER — CLONIDINE HCL 0.2 MG PO TABS
0.2000 mg | ORAL_TABLET | Freq: Three times a day (TID) | ORAL | Status: DC
  Administered 2013-05-28 – 2013-06-01 (×13): 0.2 mg via ORAL
  Filled 2013-05-28 (×14): qty 1

## 2013-05-28 MED ORDER — HYDRALAZINE HCL 50 MG PO TABS
50.0000 mg | ORAL_TABLET | Freq: Three times a day (TID) | ORAL | Status: DC
  Administered 2013-05-28 – 2013-06-01 (×12): 50 mg via ORAL
  Filled 2013-05-28 (×14): qty 1

## 2013-05-28 MED ORDER — LABETALOL HCL 5 MG/ML IV SOLN
INTRAVENOUS | Status: DC | PRN
  Administered 2013-05-28: 2 mg via INTRAVENOUS
  Administered 2013-05-28: 4 mg via INTRAVENOUS
  Administered 2013-05-28: 2 mg via INTRAVENOUS
  Administered 2013-05-28 (×2): 4 mg via INTRAVENOUS

## 2013-05-28 MED ORDER — EPHEDRINE SULFATE 50 MG/ML IJ SOLN
INTRAMUSCULAR | Status: AC
  Filled 2013-05-28: qty 1

## 2013-05-28 MED ORDER — FENTANYL CITRATE 0.05 MG/ML IJ SOLN
INTRAMUSCULAR | Status: DC | PRN
  Administered 2013-05-28 (×2): 50 ug via INTRAVENOUS
  Administered 2013-05-28: 25 ug via INTRAVENOUS
  Administered 2013-05-28: 50 ug via INTRAVENOUS
  Administered 2013-05-28: 25 ug via INTRAVENOUS
  Administered 2013-05-28 (×3): 50 ug via INTRAVENOUS

## 2013-05-28 MED ORDER — ACETAMINOPHEN 10 MG/ML IV SOLN
1000.0000 mg | Freq: Four times a day (QID) | INTRAVENOUS | Status: AC
  Administered 2013-05-28 – 2013-05-29 (×4): 1000 mg via INTRAVENOUS
  Filled 2013-05-28 (×5): qty 100

## 2013-05-28 MED ORDER — MIDAZOLAM HCL 5 MG/5ML IJ SOLN
INTRAMUSCULAR | Status: DC | PRN
  Administered 2013-05-28: 2 mg via INTRAVENOUS

## 2013-05-28 MED ORDER — METOCLOPRAMIDE HCL 5 MG/ML IJ SOLN
INTRAMUSCULAR | Status: DC | PRN
  Administered 2013-05-28: 10 mg via INTRAVENOUS

## 2013-05-28 MED ORDER — LABETALOL HCL 5 MG/ML IV SOLN
INTRAVENOUS | Status: AC
Start: 2013-05-28 — End: 2013-05-29
  Filled 2013-05-28: qty 4

## 2013-05-28 MED ORDER — ISOSORBIDE DINITRATE 5 MG PO TABS
5.0000 mg | ORAL_TABLET | Freq: Two times a day (BID) | ORAL | Status: DC
  Administered 2013-05-28 – 2013-06-01 (×8): 5 mg via ORAL
  Filled 2013-05-28 (×9): qty 1

## 2013-05-28 MED ORDER — OXYCODONE HCL 5 MG PO TABS
5.0000 mg | ORAL_TABLET | ORAL | Status: DC | PRN
  Administered 2013-05-29: 10 mg via ORAL
  Administered 2013-05-29: 5 mg via ORAL
  Administered 2013-05-29: 10 mg via ORAL
  Administered 2013-05-29 – 2013-05-30 (×2): 5 mg via ORAL
  Administered 2013-05-30: 10 mg via ORAL
  Filled 2013-05-28 (×3): qty 1
  Filled 2013-05-28: qty 2
  Filled 2013-05-28: qty 1
  Filled 2013-05-28 (×2): qty 2

## 2013-05-28 MED ORDER — BUPIVACAINE LIPOSOME 1.3 % IJ SUSP
20.0000 mL | Freq: Once | INTRAMUSCULAR | Status: AC
  Administered 2013-05-28: 20 mL
  Filled 2013-05-28: qty 20

## 2013-05-28 MED ORDER — METOCLOPRAMIDE HCL 5 MG/ML IJ SOLN
INTRAMUSCULAR | Status: AC
  Filled 2013-05-28: qty 2

## 2013-05-28 MED ORDER — HYDROMORPHONE HCL PF 1 MG/ML IJ SOLN
INTRAMUSCULAR | Status: DC | PRN
  Administered 2013-05-28 (×2): 1 mg via INTRAVENOUS

## 2013-05-28 MED ORDER — ZOLPIDEM TARTRATE 5 MG PO TABS
5.0000 mg | ORAL_TABLET | Freq: Every evening | ORAL | Status: DC | PRN
  Administered 2013-05-30 – 2013-05-31 (×2): 5 mg via ORAL
  Filled 2013-05-28 (×2): qty 1

## 2013-05-28 SURGICAL SUPPLY — 70 items
ADH SKN CLS APL DERMABOND .7 (GAUZE/BANDAGES/DRESSINGS) ×1
APL SKNCLS STERI-STRIP NONHPOA (GAUZE/BANDAGES/DRESSINGS) ×1
APL SRG 32X5 SNPLK LF DISP (MISCELLANEOUS) ×1
APPLIER CLIP ROT 10 11.4 M/L (STAPLE)
APR CLP MED LRG 11.4X10 (STAPLE)
BAG SPEC THK2 15X12 ZIP CLS (MISCELLANEOUS) ×1
BAG ZIPLOCK 12X15 (MISCELLANEOUS) ×2 IMPLANT
BENZOIN TINCTURE PRP APPL 2/3 (GAUZE/BANDAGES/DRESSINGS) ×2 IMPLANT
BLADE EXTENDED COATED 6.5IN (ELECTRODE) IMPLANT
BLADE SURG SZ10 CARB STEEL (BLADE) IMPLANT
CANISTER SUCTION 2500CC (MISCELLANEOUS) IMPLANT
CHLORAPREP W/TINT 26ML (MISCELLANEOUS) ×2 IMPLANT
CLIP APPLIE ROT 10 11.4 M/L (STAPLE) IMPLANT
CLIP LIGATING HEM O LOK PURPLE (MISCELLANEOUS) ×4 IMPLANT
CLIP LIGATING HEMO LOK XL GOLD (MISCELLANEOUS) ×3 IMPLANT
CLIP LIGATING HEMO O LOK GREEN (MISCELLANEOUS) ×4 IMPLANT
COVER SURGICAL LIGHT HANDLE (MISCELLANEOUS) ×2 IMPLANT
CUTTER FLEX LINEAR 45M (STAPLE) ×1 IMPLANT
DERMABOND ADVANCED (GAUZE/BANDAGES/DRESSINGS) ×1
DERMABOND ADVANCED .7 DNX12 (GAUZE/BANDAGES/DRESSINGS) ×1 IMPLANT
DRAIN CHANNEL 10F 3/8 F FF (DRAIN) IMPLANT
DRAPE LAPAROSCOPIC ABDOMINAL (DRAPES) ×2 IMPLANT
ELECT REM PT RETURN 9FT ADLT (ELECTROSURGICAL) ×2
ELECTRODE REM PT RTRN 9FT ADLT (ELECTROSURGICAL) ×1 IMPLANT
EVACUATOR SILICONE 100CC (DRAIN) IMPLANT
FLOSEAL 10ML (HEMOSTASIS) IMPLANT
GLOVE BIOGEL M STRL SZ7.5 (GLOVE) ×2 IMPLANT
GOWN STRL REUS W/TWL LRG LVL3 (GOWN DISPOSABLE) ×8 IMPLANT
HEMOSTAT SURGICEL 4X8 (HEMOSTASIS) ×2 IMPLANT
KIT BASIN OR (CUSTOM PROCEDURE TRAY) ×2 IMPLANT
MANIFOLD NEPTUNE II (INSTRUMENTS) ×2 IMPLANT
PENCIL BUTTON HOLSTER BLD 10FT (ELECTRODE) ×2 IMPLANT
POSITIONER SURGICAL ARM (MISCELLANEOUS) ×2 IMPLANT
POUCH ENDO CATCH II 15MM (MISCELLANEOUS) ×2 IMPLANT
RELOAD 45 VASCULAR/THIN (ENDOMECHANICALS) ×8 IMPLANT
RELOAD STAPLE 45 2.5 WHT GRN (ENDOMECHANICALS) IMPLANT
RETRACTOR LAPSCP 12X46 CVD (ENDOMECHANICALS) IMPLANT
RTRCTR LAPSCP 12X46 CVD (ENDOMECHANICALS)
SCISSORS LAP 5X35 DISP (ENDOMECHANICALS) ×2 IMPLANT
SEALANT SURGICAL APPL DUAL CAN (MISCELLANEOUS) ×2 IMPLANT
SET IRRIG TUBING LAPAROSCOPIC (IRRIGATION / IRRIGATOR) ×2 IMPLANT
SLEEVE XCEL OPT CAN 5 100 (ENDOMECHANICALS) ×3 IMPLANT
SOLUTION ANTI FOG 6CC (MISCELLANEOUS) ×2 IMPLANT
SPONGE GAUZE 4X4 12PLY (GAUZE/BANDAGES/DRESSINGS) ×2 IMPLANT
SPONGE LAP 18X18 X RAY DECT (DISPOSABLE) ×4 IMPLANT
SPONGE LAP 4X18 X RAY DECT (DISPOSABLE) ×2 IMPLANT
SPONGE SURGIFOAM ABS GEL 100 (HEMOSTASIS) IMPLANT
STAPLER VISISTAT 35W (STAPLE) ×1 IMPLANT
STRIP CLOSURE SKIN 1/4X4 (GAUZE/BANDAGES/DRESSINGS) ×2 IMPLANT
SUT ETHILON 3 0 PS 1 (SUTURE) IMPLANT
SUT MNCRL AB 4-0 PS2 18 (SUTURE) ×2 IMPLANT
SUT PDS AB 1 CTX 36 (SUTURE) IMPLANT
SUT VIC AB 2-0 CT1 27 (SUTURE) ×2
SUT VIC AB 2-0 CT1 27XBRD (SUTURE) ×1 IMPLANT
SUT VICRYL 0 UR6 27IN ABS (SUTURE) ×4 IMPLANT
SUT VICRYL RAPIDE 4/0 PS 2 (SUTURE) ×2 IMPLANT
SYS LAPSCP GELPORT 120MM (MISCELLANEOUS)
SYSTEM LAPSCP GELPORT 120MM (MISCELLANEOUS) IMPLANT
TAPE STRIPS DRAPE STRL (GAUZE/BANDAGES/DRESSINGS) ×2 IMPLANT
TOWEL OR 17X26 10 PK STRL BLUE (TOWEL DISPOSABLE) ×2 IMPLANT
TOWEL OR NON WOVEN STRL DISP B (DISPOSABLE) ×2 IMPLANT
TRAY FOLEY CATH 14FRSI W/METER (CATHETERS) IMPLANT
TRAY FOLEY CATH 16FRSI W/METER (SET/KITS/TRAYS/PACK) ×2 IMPLANT
TRAY LAP CHOLE (CUSTOM PROCEDURE TRAY) ×2 IMPLANT
TROCAR BLADELESS OPT 5 100 (ENDOMECHANICALS) ×2 IMPLANT
TROCAR ENDOPATH XCEL 12X100 BL (ENDOMECHANICALS) ×1 IMPLANT
TROCAR XCEL 12X100 BLDLESS (ENDOMECHANICALS) ×2 IMPLANT
TROCAR XCEL BLUNT TIP 100MML (ENDOMECHANICALS) IMPLANT
TUBING INSUFFLATION 10FT LAP (TUBING) ×2 IMPLANT
YANKAUER SUCT BULB TIP 10FT TU (MISCELLANEOUS) ×2 IMPLANT

## 2013-05-28 NOTE — Consult Note (Signed)
[facility] Medical Consultation  Brian Lawson [id number] DOB: Aug 13, 1955 DOA: 05/28/2013 PCP: Criselda Peaches, MD   Requesting physician: Dr. Louis Meckel Date of consultation: 05/28/13 Reason for consultation: Renal Failure  Impression/Recommendations Active Problems:   HYPERTENSION   OSTEOARTHRITIS   Renal mass, left   CKD (chronic kidney disease) stage 3, GFR 30-59 ml/min  1. CKD, stage 3 1. GFR appears to be near baseline with Cr at around 2 2. Repeat bmet has already been ordered and is currently pending 3. Pt is s/p L radical nephrectomy secondary to renal mass 4. Would follow renal function closely 5. Avoid nephrotoxic agents 2. HTN 1. Poorly controlled post-operatively 2. Agree with resuming home bp meds 3. Agree with PRN IV hydralazine as already ordered 3. L sided renal mass 1. Pt is s/p nephrectomy 4. DVT prophylaxis 1. SCD's  I will followup again tomorrow. Please contact me if I can be of assistance in the meanwhile. Thank you for this consultation.  Chief Complaint: Renal mass  HPI:  Pt is a 58yo who was admitted under the care of the Urology service for L sided nephrectomy. Prior to this admit, the patient was admitted to the inpt service in 10/14 after being found unresponsive. Work up at that time revealed sepsis with pna and evidence of acute renal failure. Work up of the patient's renal failure incidentally demonstrated a 9cm L sided renal mass and Urology was consulted. Recommendations at that time were for nephrectomy ultimately. The patient underwent AV fistula placement empirically , should he require dialysis in the future.  The patient has since been admitted to the Urology service and is now s/p radical L nephrectomy. Most recent Cr was noted to be just over 2. Given concerns of potential worsening renal fx, the hospitalist service was consulted.  Review of Systems:  Per above, the remainder of the 10pt ros reviewed and are neg  Past Medical  History  Diagnosis Date  . Hypertension   . Gout   . Hepatitis     30 years ago  . Arthritis     OA  . Chronic kidney disease     ACUTE RENAL FAILURE 12/2012 STATUS POST RT ARM AV FISTULA - HAS NOT STARTED DIALYSIS; LEFT RENAL MASS  . Pneumonia     HOSPITALIZED 12/2012 LEGIONLLA PNEUMONIA   Past Surgical History  Procedure Laterality Date  . Hernia repair    . Bascilic vein transposition Right 01/18/2013    Procedure: BASCILIC VEIN TRANSPOSITION;  Surgeon: Elam Dutch, MD;  Location: [place];  Service: Vascular;  Laterality: Right;  . Bascilic vein transposition Right 04/27/2013    Procedure: BASCILIC VEIN TRANSPOSITION AND REVISION;  Surgeon: Elam Dutch, MD;  Location: [place];  Service: Vascular;  Laterality: Right;   Social History:  reports that he has quit smoking. He has never used smokeless tobacco. He reports that he drinks alcohol. He reports that he does not use illicit drugs.  No Known Allergies Family History  Problem Relation Age of Onset  . Hypertension Mother     Prior to Admission medications   Medication Sig Start Date End Date Taking? Authorizing Provider  calcitRIOL (ROCALTROL) 0.25 MCG capsule Take 0.25 mcg by mouth every morning.  01/25/13  Yes Adeline Saralyn Pilar, MD  calcium acetate (PHOSLO) 667 MG capsule Take 1,334 mg by mouth 3 (three) times daily with meals. 01/25/13  Yes Adeline Saralyn Pilar, MD  cloNIDine (CATAPRES) 0.2 MG tablet Take 0.2 mg by mouth 3 (three)  times daily. 01/25/13  Yes Adeline Saralyn Pilar, MD  hydrALAZINE (APRESOLINE) 50 MG tablet Take 50 mg by mouth 3 (three) times daily. 01/25/13  Yes Adeline Saralyn Pilar, MD  isosorbide dinitrate (ISORDIL) 5 MG tablet Take 5 mg by mouth 2 (two) times daily. 01/25/13  Yes Adeline Saralyn Pilar, MD  omeprazole (PRILOSEC) 40 MG capsule Take 40 mg by mouth daily. 01/25/13  Yes Adeline C Viyuoh, MD  oxyCODONE (ROXICODONE) 5 MG immediate release tablet Take 1 tablet (5 mg total) by mouth every 6 (six) hours as  needed for severe pain. 04/27/13  Yes Samantha J Rhyne, PA-C  predniSONE (DELTASONE) 10 MG tablet Take 10 mg by mouth daily with breakfast.   Yes Historical Provider, MD  sodium bicarbonate 650 MG tablet Take 650 mg by mouth 2 (two) times daily. 01/25/13  Yes Adeline Saralyn Pilar, MD  diclofenac sodium (VOLTAREN) 1 % GEL Apply 2 g topically 4 (four) times daily as needed (pain - FROM GOUT).     Historical Provider, MD   Physical Exam: Blood pressure 176/96, pulse 73, temperature 98.6 F (37 C), temperature source Oral, resp. rate 16, SpO2 100.00%. Filed Vitals:   05/28/13 1345 05/28/13 1350 05/28/13 1355 05/28/13 1400  BP: 174/101 169/97 176/96   Pulse: 74  73   Temp:    98.6 F (37 C)  TempSrc:      Resp: 18  16   SpO2: 100%  100%      General:  Awake, in nad  Eyes: PERRL B  ENT: membranes moist, dentition fair  Neck: trachea midline, neck supple  Cardiovascular: regular, s1, s2  Respiratory: normal resp effort, soft, nondistended  Abdomen: soft,nondistend, pos BS  Skin: normal skin turgor, no abnormal skin lesions seen  Musculoskeletal: perfused, no clubbing  Psychiatric: mood/affect normal // no auditory/visual hallucinations  Neurologic: cn2-12 grossly intact, strength/sensation intact  Labs on Admission:  Basic Metabolic Panel:  Recent Labs Lab 05/21/13 1500 05/28/13 0733  NA 137 137  K 5.8* 4.7  CL 98  --   CO2 30  --   GLUCOSE 100* 143*  BUN 28*  --   CREATININE 2.08*  --   CALCIUM 9.7  --    Liver Function Tests:  Recent Labs Lab 05/21/13 1500  AST 20  ALT 28  ALKPHOS 52  BILITOT <0.2*  PROT 7.2  ALBUMIN 3.2*   No results found for this basename: LIPASE, AMYLASE,  in the last 168 hours No results found for this basename: AMMONIA,  in the last 168 hours CBC:  Recent Labs Lab 05/21/13 1500 05/28/13 0733 05/28/13 1336  WBC 9.9  --  11.1*  HGB 12.7* 13.6 12.9*  HCT 40.7 40.0 40.2  MCV 92.9  --  92.0  PLT 164  --  134*   Cardiac  Enzymes: No results found for this basename: CKTOTAL, CKMB, CKMBINDEX, TROPONINI,  in the last 168 hours BNP: No components found with this basename: POCBNP,  CBG: No results found for this basename: GLUCAP,  in the last 168 hours  Radiological Exams on Admission: No results found.  Time spent: 82min  Rorey Bisson K [facility] Pager [phone]  If 7PM-7AM, please contact night-coverage [email or web address] Password [email or web address] 05/28/2013, 2:09 PM

## 2013-05-28 NOTE — Op Note (Signed)
Preoperative diagnosis:  1. Left renal mass   Postoperative diagnosis:  1. Same 2. Chronic renal failure   Procedure: 1. Left laparoscopic radical nephrectomy  Surgeon: Ardis Hughs, MD Assistant: Festus Aloe, MD  Anesthesia: General  Complications: None  Intraoperative findings: large renal mass, one artery/one vein, partial adrenal sparing  EBL: Minimal  Specimens: None  Indication: Brian Lawson is a 58 y.o. patient with a history of a left renal mass. This was discovered incidentally when the patient was found down and then diagnosed with legionella pneumonia.  The patient also has chronic renal insufficiency and has received a AV fistula in his right arm in preparation for dialysis. After reviewing the management options for treatment, he elected to proceed with the above surgical procedure(s). We have discussed the potential benefits and risks of the procedure, side effects of the proposed treatment, the likelihood of the patient achieving the goals of the procedure, and any potential problems that might occur during the procedure or recuperation. Informed consent has been obtained.  Description of procedure:  A site was selected lateral to the umbilicus for placement of the camera port. This was placed using a standard open Hassan technique which allowed entry into the peritoneal cavity under direct vision and without difficulty. A 12 mm Hassan cannula was placed and a pneumoperitoneum established. The camera was then used to inspect the abdomen and there was no evidence of any intra-abdominal injuries or other abnormalities. The remaining abdominal ports were then placed. One 5 mm trocar was placed subcostal margin in the left upper quadrant, the second 5 mm trocar was placed laterally to the camera port so as to triangulate the kidney. An assistant port was then placed in between the camera and the left lateral port. The assistant port was a 12 mm port.  The white  line of Toldt was incised allowing the colon to be mobilized medially and the plane between the mesocolon and the anterior layer of Gerota's fascia to be developed and the kidney exposed. The ureter and gonadal vein were identified inferiorly and the ureter was lifted anteriorly off the psoas muscle. The gonadal vein was then dissected out inferior to the lower pole and 2 clips were placed both superiorly and inferiorly and then ligated. A second 5 mm port was placed in the left lower quadrant to help facilitate lifting up of the kidney. Dissection proceeded superiorly along the gonadal vein until the renal vein was identified. The renal hilum was then carefully isolated with a combination of blunt and sharp dissectiong allowing the renal arterial and venous structures to be separated and isolated.  The renal artery was isolated and ligated with a 45 mm Flex ETS stapler. The renal vein was then isolated and also ligated and divided with a 45 mm Flex ETS stapler.   Gerota's fascia was intentionally entered superiorly and the space between the adrenal gland and the kidney was developed allowing the adrenal gland to be spared. A third staple load was then used which divided the adrenal gland.   once the hilum had been ligated dissection ensued from the inferior pole of the kidney. The ureter was transected placing 2 clips on the stay side and one on the specimen side. The lateral attachments of the kidney were then freed. Our attention was then turned to the upper pole which was dissected off of the spleen and the splenic attachments. The pancreas and colon were noted to be well away from the structures. The remaining of the  posterior aspect of the attachments was then ligated using a Harmonic Scalpel. Once the kidney was freed from its attachments it was shown to medially and the vascular hilum was inspected and noted to be sufficiently hemostatic. There was slight bit of oozing from the adrenal gland which was  cauterized and then Surgicel placed over top. Insufflation was then turned down to 8 mm mercury and the kidney bed was noted to be hemostatic.   40cc of Exparel was then injected into the  left  anterior axillary line b/w the iliac crest and the twelfth rib under laparoscopic guidance. The layer between the tranversus abdominus and the internal oblique was targeted.   The kidney/ureter specimen was then placed into a 15 mm Endocatch II retrieval bag, this was passed through the port site of the assistant port and left lower quadrant. The trochars were then removed under visual guidance to ensure no ongoing port site bleeding was occurring. The extraction incision was extended from the 12 mm left lower quadrant port. The external oblique and internal oblique muscles were spread as best as possible with as little muscle fibers ligated as possible in order to safely extracted the specimen. The internal oblique flash of was then closed with 2-0 Vicryl in an interrupted figure-of-eight fashion. The external oblique fascia was closed with a 0 looped PDS. The camera port was then closed with 2-0 Vicryl the level of the fascia.  All incisions were injected with Exparel  and reapproximated at the skin with 4-0 monocryl sutures. Dermabond was applied to the skin. The patient tolerated the procedure well and without complications and was transferred to the recovery unit in satisfactory condition.   Ardis Hughs, M.D.

## 2013-05-28 NOTE — Preoperative (Signed)
Beta Blockers   Reason not to administer Beta Blockers:Not Applicable, not on home BB 

## 2013-05-28 NOTE — Progress Notes (Signed)
Pharmacy consult:  Renally adjust all medications  32 yoM with hx of chronic renal insufficiency s/p AVF in right arm, also found to have large renal mass.  Pt today underwent left lap radical nephrectomy.  Pharmacy asked to monitor medications for renal adjustments.   3/20: SCr 2.10, CrCl 47 ml/min  No medications ordered currently need renal adjustment.  Pharmacy will continue to monitor daily. Thank you for the consult.    Ralene Bathe, PharmD, BCPS 05/28/2013, 2:48 PM  Pager: [phone]

## 2013-05-28 NOTE — H&P (Signed)
Reason For Visit Left renal mass   History of Present Illness This is a 58 year old male who I am seeing in follow-up for a left renal mass which was incidentally noted the patient's most recent hospitalization. The patient was hospitalized for legionella pneumonia and acute renal failure. His renal function has improved, but his GFR still remains around 25. He has recently undergone basilic vein transposition for the creation of a AV fistula and initiation of dialysis. This is in preparation for his left nephrectomy.    The patient denies any symptoms including flank pain or gross hematuria. No changes in his voiding symptoms. He is been voiding normally. His energy level has been improving. The patient has had an inguinal hernia repair as well as his AV fistula creation but no intra-abdominal procedures.   Past Medical History Problems  1. History of arthritis (V13.4) 2. History of gout (V12.29) 3. History of hepatitis (V12.09) 4. History of hypertension (V12.59)  Surgical History Problems  1. History of Hernia Repair  Allergies Medication  1. No Known Drug Allergies  Family History Problems  1. Family history of malignant neoplasm (V16.9) : Father  Social History Problems    Denied: History of Alcohol use   Caffeine use (V49.89)   Former smoker [place])   quit 2013   Occupation   student   Single  Review of Systems Genitourinary, constitutional, skin, eye, otolaryngeal, hematologic/lymphatic, cardiovascular, pulmonary, endocrine, musculoskeletal, gastrointestinal, neurological and psychiatric system(s) were reviewed and pertinent findings if present are noted.  Gastrointestinal: constipation.  Constitutional: feeling tired (fatigue) and recent weight loss.    Vitals Vital Signs [Data Includes: Last 1 Day]  Recorded: 26Jan2015 01:37PM  Height: 6 ft 3 in Weight: 211 lb  BMI Calculated: 26.37 BSA Calculated: 2.24 Blood Pressure: 150 / 88 Temperature: 97.9  F Heart Rate: 89  Physical Exam Constitutional: Well nourished and well developed . No acute distress.  ENT:. The ears and nose are normal in appearance.  Neck: The appearance of the neck is normal and no neck mass is present.  Pulmonary: No respiratory distress, normal respiratory rhythm and effort and clear bilateral breath sounds.  Cardiovascular: Heart rate and rhythm are normal . No peripheral edema.  Abdomen: The abdomen is soft and nontender. No masses are palpated. No CVA tenderness. No hernias are palpable. No hepatosplenomegaly noted.  Lymphatics: The femoral and inguinal nodes are not enlarged or tender.  Skin: Normal skin turgor, no visible rash and no visible skin lesions.  Neuro/Psych:. Mood and affect are appropriate.    Results/Data  The following images/tracing/specimen were independently visualized: .   IMPRESSION:  1. 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    2. No evidence of metastasis or involvement of the renal vein this  non contrast exam. If the patient is a chronic dialysis patient, a  CT with IV contrast could be performed.    Assessment Assessed  1. Left renal mass (593.9) 2. Caffeine use (V49.89)  Plan Left renal mass  1. CBC W/DIFF; Status:In Progress - Specimen/Data Collected;   Done: [id number] 2. COMPREHENSIVE METABOLIC PANEL; Status:In Progress - Specimen/Data Collected;    Done: [id number] 3. CT-ABD/PELVIS W/O CONTRAST; Status:Hold For - Appointment,PreCert,Date of  Service,Print; Requested [id number];  4. CT-CHEST W/O CONTRAST; Status:Hold For - Appointment,PreCert,Date of Service,Print;  Requested [id number];  5. INTERVENTIONAL RADIOLOGY; Status:Hold For - Appointment,PreCert,Print,Records;  Requested [id number];  6. VENIPUNCTURE; Status:Complete;   [id number]  Discussion/Summary I discussed  with the patient the chance that this is a malignancy. However, given  his current situation and his likely need for hemodialysis, I think it is worth a biopsy. If this is an oncocytoma, I think we can follow this at least initially and refrain from removing the patient's kidney. However, given the central location of his mass as well as the size, I do not think that this is a mass that we can perform a partial nephrectomy and thus a real chance for requiring permanent dialysis.     Further, his last MRI was in October, I would like to get a noncontrast chest abdomen and pelvis CT scan to evaluate for metastatic disease. In addition, I would like to recheck the patient's renal function today, the last results I have are from beginning of December, BUN was 58 and creatinine 2.83, hemoglobin was 13.0.      I went over laparoscopic radical nephrectomy with the patient. I discussed with him the surgery in detail including the port placement, as well as the need to make an larger incision for removing the kidney. We discussed the risks and benefits of the procedure including infection, bleeding, damage to the surrounding structures, injury to surrounding vasculature and nerves, and the risk of spread of his cancer. I also went over the hospital course the patient, I told him that he should expect to be in the hospital 1-3 nights.

## 2013-05-28 NOTE — Transfer of Care (Signed)
Immediate Anesthesia Transfer of Care Note  Patient: Brian Lawson  Procedure(s) Performed: Procedure(s): LEFT LAPAROSCOPIC  RADICAL NEPHRECTOMY (Left)  Patient Location: PACU  Anesthesia Type:General  Level of Consciousness: Patient easily awoken, sedated, comfortable, cooperative, following commands, responds to stimulation.   Airway & Oxygen Therapy: Patient spontaneously breathing, ventilating well, oxygen via simple oxygen mask.  Post-op Assessment: Report given to PACU RN, vital signs reviewed and stable, moving all extremities.   Post vital signs: Reviewed and stable.  Complications: No apparent anesthesia complications

## 2013-05-28 NOTE — Anesthesia Postprocedure Evaluation (Signed)
  Anesthesia Post-op Note  Patient: Brian Lawson  Procedure(s) Performed: Procedure(s) (LRB): LEFT LAPAROSCOPIC  RADICAL NEPHRECTOMY (Left)  Patient Location: PACU  Anesthesia Type: General  Level of Consciousness: awake and alert   Airway and Oxygen Therapy: Patient Spontanous Breathing  Post-op Pain: mild  Post-op Assessment: Post-op Vital signs reviewed, Patient's Cardiovascular Status Stable, Respiratory Function Stable, Patent Airway and No signs of Nausea or vomiting  Last Vitals:  Filed Vitals:   05/28/13 1414  BP: 165/96  Pulse: 75  Temp: 36.7 C  Resp:     Post-op Vital Signs: stable   Complications: No apparent anesthesia complications

## 2013-05-28 NOTE — Anesthesia Preprocedure Evaluation (Addendum)
Anesthesia Evaluation  Patient identified by MRN, date of birth, ID band Patient awake    Reviewed: Allergy & Precautions, H&P , NPO status , Patient's Chart, lab work & pertinent test results  Airway Mallampati: II TM Distance: >3 FB Neck ROM: Full    Dental  (+) Teeth Intact, Dental Advisory Given   Pulmonary pneumonia -, resolved, former smoker,  breath sounds clear to auscultation        Cardiovascular hypertension, Pt. on medications Rhythm:Regular Rate:Normal     Neuro/Psych negative neurological ROS  negative psych ROS   GI/Hepatic negative GI ROS, Neg liver ROS,   Endo/Other  negative endocrine ROS  Renal/GU CRFRenal disease  negative genitourinary   Musculoskeletal negative musculoskeletal ROS (+)   Abdominal   Peds negative pediatric ROS (+)  Hematology negative hematology ROS (+)   Anesthesia Other Findings   Reproductive/Obstetrics negative OB ROS                          Anesthesia Physical  Anesthesia Plan  ASA: II  Anesthesia Plan: General   Post-op Pain Management:    Induction: Intravenous  Airway Management Planned: Oral ETT  Additional Equipment:   Intra-op Plan:   Post-operative Plan: Extubation in OR  Informed Consent: I have reviewed the patients History and Physical, chart, labs and discussed the procedure including the risks, benefits and alternatives for the proposed anesthesia with the patient or authorized representative who has indicated his/her understanding and acceptance.   Dental advisory given  Plan Discussed with: CRNA and Anesthesiologist  Anesthesia Plan Comments:         Anesthesia Quick Evaluation

## 2013-05-29 DIAGNOSIS — N179 Acute kidney failure, unspecified: Secondary | ICD-10-CM

## 2013-05-29 DIAGNOSIS — N186 End stage renal disease: Secondary | ICD-10-CM

## 2013-05-29 LAB — CBC
HEMATOCRIT: 33.9 % — AB (ref 39.0–52.0)
Hemoglobin: 10.6 g/dL — ABNORMAL LOW (ref 13.0–17.0)
MCH: 29 pg (ref 26.0–34.0)
MCHC: 31.3 g/dL (ref 30.0–36.0)
MCV: 92.6 fL (ref 78.0–100.0)
PLATELETS: 129 10*3/uL — AB (ref 150–400)
RBC: 3.66 MIL/uL — AB (ref 4.22–5.81)
RDW: 16.7 % — AB (ref 11.5–15.5)
WBC: 7.3 10*3/uL (ref 4.0–10.5)

## 2013-05-29 LAB — BASIC METABOLIC PANEL
BUN: 44 mg/dL — ABNORMAL HIGH (ref 6–23)
CHLORIDE: 98 meq/L (ref 96–112)
CO2: 24 meq/L (ref 19–32)
CREATININE: 2.8 mg/dL — AB (ref 0.50–1.35)
Calcium: 8.4 mg/dL (ref 8.4–10.5)
GFR calc Af Amer: 27 mL/min — ABNORMAL LOW (ref >90)
GFR calc non Af Amer: 23 mL/min — ABNORMAL LOW (ref >90)
Glucose, Bld: 137 mg/dL — ABNORMAL HIGH (ref 70–99)
Potassium: 4.9 mEq/L (ref 3.7–5.3)
SODIUM: 135 meq/L — AB (ref 137–147)

## 2013-05-29 MED ORDER — SIMETHICONE 80 MG PO CHEW
80.0000 mg | CHEWABLE_TABLET | Freq: Four times a day (QID) | ORAL | Status: DC | PRN
  Administered 2013-05-29 – 2013-06-01 (×3): 80 mg via ORAL
  Filled 2013-05-29 (×6): qty 1

## 2013-05-29 NOTE — Progress Notes (Signed)
Pt stated he was feeling full and a little bloated. Pt asked for something to prevent him from getting sick. Pt given zofran 4 mg PO

## 2013-05-29 NOTE — Progress Notes (Signed)
Vitals normal Hb 10.6 Cr 2.8 Minimal pain Incision and belly good Urine output great Advance diet as tolerated Monitor labs mobilize

## 2013-05-29 NOTE — Progress Notes (Signed)
Pt assessment unchanged. Denies discomfort at present time, resting quietly without distress. SRP,RN

## 2013-05-29 NOTE — Progress Notes (Signed)
UR completed 

## 2013-05-29 NOTE — Progress Notes (Signed)
[facility] PROGRESS NOTE  Brian Lawson [id number] DOB: [birth year] DOA: 05/28/2013 PCP: Criselda Peaches, MD  Assessment/Plan: 1. CKD, stage 3  1. Baseline Cr at around 2. Today Cr up to 2.8. 2. Pt is s/p L radical nephrectomy secondary to renal mass 3. Avoid nephrotoxic agents 4. Will increase IVF to 100cc/hr 5. Follow renal fx closely 2. HTN  1. Initially poorly controlled post-operatively 2. Better after resuming home bp meds 3. Agree with cont PRN IV hydralazine as already ordered by the primary service 3. L sided renal mass  1. Pt is s/p nephrectomy 4. DVT prophylaxis  1. SCD's  I will followup again tomorrow. Please contact me if I can be of assistance in the meanwhile. Thank you for this consultation.  Code Status: Full Family Communication: Pt in room (indicate person spoken with, relationship, and if by phone, the number) Disposition Plan: Pending   HPI/Subjective: No acute events noted overnight.  Objective: Filed Vitals:   05/28/13 1414 05/28/13 1558 05/28/13 2140 05/29/13 0533  BP: 165/96 164/94 164/94 135/88  Pulse: 75 68 78 81  Temp: 98 F (36.7 C)  97.6 F (36.4 C) 97.6 F (36.4 C)  TempSrc:   Oral Oral  Resp:   16 16  Height:      Weight:      SpO2: 100%  100% 100%    Intake/Output Summary (Last 24 hours) at 05/29/13 1028 Last data filed at 05/29/13 0739  Gross per 24 hour  Intake 3192.5 ml  Output   1355 ml  Net 1837.5 ml   Filed Weights   05/28/13 1400  Weight: 101.52 kg (223 lb 13 oz)    Exam:   General:  Awake, in nad  Cardiovascular: regular, s1, s2  Respiratory: normal resp effort, no wheezing  Abdomen: soft, nondistended  Musculoskeletal: perfused, no clubbing   Data Reviewed: Basic Metabolic Panel:  Recent Labs Lab 05/28/13 0733 05/28/13 1336 05/29/13 0540  NA 137 137 135*  K 4.7 4.9 4.9  CL  --  99 98  CO2  --  21 24  GLUCOSE 143* 173* 137*  BUN  --  43* 44*  CREATININE  --  2.10* 2.80*  CALCIUM   --  8.4 8.4   Liver Function Tests: No results found for this basename: AST, ALT, ALKPHOS, BILITOT, PROT, ALBUMIN,  in the last 168 hours No results found for this basename: LIPASE, AMYLASE,  in the last 168 hours No results found for this basename: AMMONIA,  in the last 168 hours CBC:  Recent Labs Lab 05/28/13 0733 05/28/13 1336 05/29/13 0540  WBC  --  11.1* 7.3  HGB 13.6 12.9* 10.6*  HCT 40.0 40.2 33.9*  MCV  --  92.0 92.6  PLT  --  134* 129*   Cardiac Enzymes: No results found for this basename: CKTOTAL, CKMB, CKMBINDEX, TROPONINI,  in the last 168 hours BNP (last 3 results) No results found for this basename: PROBNP,  in the last 8760 hours CBG: No results found for this basename: GLUCAP,  in the last 168 hours  Recent Results (from the past 240 hour(s))  URINE CULTURE     Status: None   Collection Time    05/21/13  3:20 PM      Result Value Ref Range Status   Specimen Description URINE, RANDOM   Final   Special Requests NONE   Final   Culture  Setup Time     Final   Value: 05/22/2013 02:40  Performed at [place]     Final   Value: NO GROWTH     Performed at [facility]   Culture     Final   Value: NO GROWTH     Performed at [facility]   Report Status 05/23/2013 FINAL   Final     Studies: No results found.  Scheduled Meds: . cloNIDine  0.2 mg Oral TID  . heparin subcutaneous  5,000 Units Subcutaneous Q12H  . hydrALAZINE  50 mg Oral TID  . isosorbide dinitrate  5 mg Oral BID  . pantoprazole  40 mg Oral Daily  . predniSONE  10 mg Oral Q breakfast  . sodium bicarbonate  650 mg Oral BID   Continuous Infusions: . sodium chloride 100 mL/hr at 05/29/13 [id number]    Active Problems:   HYPERTENSION   OSTEOARTHRITIS   Renal mass, left   CKD (chronic kidney disease) stage 3, GFR 30-59 ml/min   Renal mass  Time spent: 31min  Bobbijo Holst, [place] [facility] Pager [phone]. If 7PM-7AM, please contact  night-coverage at [email or web address], password [facility] 05/29/2013, 10:28 AM  LOS: 1 day

## 2013-05-30 DIAGNOSIS — N183 Chronic kidney disease, stage 3 unspecified: Secondary | ICD-10-CM

## 2013-05-30 DIAGNOSIS — N186 End stage renal disease: Secondary | ICD-10-CM

## 2013-05-30 DIAGNOSIS — N289 Disorder of kidney and ureter, unspecified: Secondary | ICD-10-CM

## 2013-05-30 DIAGNOSIS — N179 Acute kidney failure, unspecified: Secondary | ICD-10-CM

## 2013-05-30 LAB — BASIC METABOLIC PANEL
BUN: 41 mg/dL — AB (ref 6–23)
CO2: 23 mEq/L (ref 19–32)
CREATININE: 3.01 mg/dL — AB (ref 0.50–1.35)
Calcium: 8.6 mg/dL (ref 8.4–10.5)
Chloride: 102 mEq/L (ref 96–112)
GFR calc non Af Amer: 21 mL/min — ABNORMAL LOW (ref >90)
GFR, EST AFRICAN AMERICAN: 25 mL/min — AB (ref >90)
Glucose, Bld: 132 mg/dL — ABNORMAL HIGH (ref 70–99)
POTASSIUM: 5.2 meq/L (ref 3.7–5.3)
Sodium: 137 mEq/L (ref 137–147)

## 2013-05-30 LAB — CBC
HCT: 32.9 % — ABNORMAL LOW (ref 39.0–52.0)
HEMOGLOBIN: 10.6 g/dL — AB (ref 13.0–17.0)
MCH: 29.6 pg (ref 26.0–34.0)
MCHC: 32.2 g/dL (ref 30.0–36.0)
MCV: 91.9 fL (ref 78.0–100.0)
Platelets: 154 10*3/uL (ref 150–400)
RBC: 3.58 MIL/uL — ABNORMAL LOW (ref 4.22–5.81)
RDW: 16.9 % — ABNORMAL HIGH (ref 11.5–15.5)
WBC: 7.3 10*3/uL (ref 4.0–10.5)

## 2013-05-30 MED ORDER — CALCIUM ACETATE 667 MG PO CAPS
1334.0000 mg | ORAL_CAPSULE | Freq: Three times a day (TID) | ORAL | Status: DC
  Administered 2013-05-30 – 2013-06-01 (×7): 1334 mg via ORAL
  Filled 2013-05-30 (×8): qty 2

## 2013-05-30 MED ORDER — PREDNISONE 50 MG PO TABS
60.0000 mg | ORAL_TABLET | Freq: Every day | ORAL | Status: DC
  Administered 2013-05-30 – 2013-06-01 (×3): 60 mg via ORAL
  Filled 2013-05-30 (×4): qty 1

## 2013-05-30 MED ORDER — CALCIUM ACETATE 667 MG PO CAPS
1334.0000 mg | ORAL_CAPSULE | Freq: Once | ORAL | Status: AC
  Administered 2013-05-30: 1334 mg via ORAL
  Filled 2013-05-30: qty 2

## 2013-05-30 NOTE — Progress Notes (Addendum)
[facility] PROGRESS NOTE  Maximas Reisdorf [id number] DOB: [birth year] DOA: 05/28/2013 PCP: Criselda Peaches, MD  Assessment/Plan: 1. CKD, stage 3  1. Baseline Cr at around 2. Today Cr up to 3. 2. Good urine output 3. Pt is s/p L radical nephrectomy secondary to renal mass 4. Avoid nephrotoxic agents 5. Will cont NS as tolerated 6. Follow renal fx closely 2. HTN  1. Initially poorly controlled post-operatively 2. Better after resuming home bp meds 3. Agree with cont PRN IV hydralazine as already ordered by the primary service 3. L sided renal mass  1. Pt is s/p nephrectomy 4. Gout Flare 1. Pt with known hx of gout of shoulder 2. Will give steroid taper 5. Tachycardia 1. Question acute pain in the setting of suspected acute gout flare 2. Steroid per above 3. monitor 6. DVT prophylaxis  1. SCD's   I will followup again tomorrow. Please contact me if I can be of assistance in the meanwhile. Thank you for this consultation.  Code Status: Full Family Communication: Pt in room (indicate person spoken with, relationship, and if by phone, the number) Disposition Plan: Pending   HPI/Subjective: Reports acute L shoulder pain suggestive of known hx of gout flare.  Objective: Filed Vitals:   05/29/13 1414 05/29/13 2000 05/30/13 0614 05/30/13 1443  BP: 134/76 157/98 145/89 142/76  Pulse: 78 105 114 121  Temp: 97.7 F (36.5 C) 98.2 F (36.8 C) 98.6 F (37 C) 98.9 F (37.2 C)  TempSrc: Oral Oral Oral Oral  Resp: 18 20 20 22   Height:      Weight:      SpO2: 100% 100% 94% 94%    Intake/Output Summary (Last 24 hours) at 05/30/13 1651 Last data filed at 05/30/13 1425  Gross per 24 hour  Intake    480 ml  Output   2325 ml  Net  -1845 ml   Filed Weights   05/28/13 1400  Weight: 101.52 kg (223 lb 13 oz)    Exam:   General:  Awake, in nad  Cardiovascular: regular, s1, s2  Respiratory: normal resp effort, no wheezing  Abdomen: soft,  nondistended  Musculoskeletal: perfused, no clubbing, L shoulder tenderness  Data Reviewed: Basic Metabolic Panel:  Recent Labs Lab 05/28/13 0733 05/28/13 1336 05/29/13 0540 05/30/13 0604  NA 137 137 135* 137  K 4.7 4.9 4.9 5.2  CL  --  99 98 102  CO2  --  21 24 23   GLUCOSE 143* 173* 137* 132*  BUN  --  43* 44* 41*  CREATININE  --  2.10* 2.80* 3.01*  CALCIUM  --  8.4 8.4 8.6   Liver Function Tests: No results found for this basename: AST, ALT, ALKPHOS, BILITOT, PROT, ALBUMIN,  in the last 168 hours No results found for this basename: LIPASE, AMYLASE,  in the last 168 hours No results found for this basename: AMMONIA,  in the last 168 hours CBC:  Recent Labs Lab 05/28/13 0733 05/28/13 1336 05/29/13 0540 05/30/13 0604  WBC  --  11.1* 7.3 7.3  HGB 13.6 12.9* 10.6* 10.6*  HCT 40.0 40.2 33.9* 32.9*  MCV  --  92.0 92.6 91.9  PLT  --  134* 129* 154   Cardiac Enzymes: No results found for this basename: CKTOTAL, CKMB, CKMBINDEX, TROPONINI,  in the last 168 hours BNP (last 3 results) No results found for this basename: PROBNP,  in the last 8760 hours CBG: No results found for this basename: GLUCAP,  in the last  168 hours  Recent Results (from the past 240 hour(s))  URINE CULTURE     Status: None   Collection Time    05/21/13  3:20 PM      Result Value Ref Range Status   Specimen Description URINE, RANDOM   Final   Special Requests NONE   Final   Culture  Setup Time     Final   Value: 05/22/2013 02:40     Performed at [place]     Final   Value: NO GROWTH     Performed at [facility]   Culture     Final   Value: NO GROWTH     Performed at [facility]   Report Status 05/23/2013 FINAL   Final     Studies: No results found.  Scheduled Meds: . calcium acetate  1,334 mg Oral TID WC  . cloNIDine  0.2 mg Oral TID  . heparin subcutaneous  5,000 Units Subcutaneous Q12H  . hydrALAZINE  50 mg Oral TID  . isosorbide  dinitrate  5 mg Oral BID  . pantoprazole  40 mg Oral Daily  . predniSONE  60 mg Oral Q breakfast  . sodium bicarbonate  650 mg Oral BID   Continuous Infusions: . sodium chloride 100 mL/hr at 05/30/13 [id number]    Active Problems:   HYPERTENSION   OSTEOARTHRITIS   Renal mass, left   CKD (chronic kidney disease) stage 3, GFR 30-59 ml/min   Renal mass  Time spent: 67min  CHIU, [place] [facility] Pager [phone]. If 7PM-7AM, please contact night-coverage at [email or web address], password [facility] 05/30/2013, 4:51 PM  LOS: 2 days

## 2013-05-30 NOTE — Progress Notes (Signed)
Patient ID: Brian Lawson, male   DOB: 02/21/1956, 58 y.o.   MRN: [id number]  Patient complaining of some sharp crampy abdominal pain and pain in the left shoulder.  No chest pain or shortness of breath.  Minimal flatus.  Foley not out and has not been out of bed today.  Filed Vitals:   05/30/13 0614  BP: 145/89  Pulse: 114  Temp: 98.6 F (37 C)  Resp: 20    PE: NAD Lying in bed eating lunch Urine is clear Cardiovascular-regular rate and rhythm Lungs-clear to auscultation bilaterally Abdomen is soft and nontender, nondistended, good bowel sounds, incisions are clean dry and intact without any erythema or exudate or induration. Extremities-no Pain or swelling.  Impression/Plan- Postop day #2 left laparoscopic radical nephrectomy-Likely resolving ileus and left shoulder pain likely from left sided laparoscopic surgery. -Tachycardia patient without specific complaints other than some pain.  H&H is stable. -Chronic renal failure-good urine output, urine clear.  Creatinine has gone from 2-3.  DC Foley. -Ambulate

## 2013-05-30 NOTE — Progress Notes (Signed)
Pt has had an excuse everytime we've offered to ambulate him today.  I just told him that he had to get up at this time and at least sit in chair.  It took him 5 min to sit up on side of bed, once I had him up I stated he might as well walk now while he was up.  Amb approx 150 ft, stated that was all he could handle, up in recliner now. I have explained several times about risk of ileus and decline in strength the longer he stays in bed.

## 2013-05-30 NOTE — Progress Notes (Signed)
   CARE MANAGEMENT NOTE 05/30/2013  Patient:  Brian Lawson, Brian Lawson   Account Number:  [id number]  Date Initiated:  05/30/2013  Documentation initiated by:  [facility]  Subjective/Objective Assessment:   s/p nephrectomy     Action/Plan:   lives at home alone   Anticipated DC Date:  06/01/2013   Anticipated DC Plan:  [place]  CM consult  Medication Assistance      Choice offered to / List presented to:             Status of service:  In process, will continue to follow Medicare Important Message given?   (If response is "NO", the following Medicare IM given date fields will be blank) Date Medicare IM given:   Date Additional Medicare IM given:    Discharge Disposition:    Per UR Regulation:    If discussed at Long Length of Stay Meetings, dates discussed:    Comments:  05/30/2013 1226 NCM spoke to pt in regards to his medication. His meds run $3.00 at pharmacy with his Medicaid. States he has no income at this time. His mother and sister try to assist with paying his bills. He has applied for disability but waiting decision. Explained to pt that most pharmacy will do a one time waiver of copay cost for Medicaid. He will have to discuss with his pharmacy. He goes to [facility]. NCM will continue to follow up until dc. Jonnie Finner RN CCM Case Mgmt phone [phone]

## 2013-05-30 NOTE — Progress Notes (Signed)
Pt already asking to go back to bed.  I told pt he needs to push himself to stay up in chair as long as he can tolerate it, to at least set a goal of 2-3 hrs, pt agreeable.

## 2013-05-31 ENCOUNTER — Encounter (HOSPITAL_COMMUNITY): Payer: Self-pay | Admitting: Urology

## 2013-05-31 DIAGNOSIS — M109 Gout, unspecified: Secondary | ICD-10-CM

## 2013-05-31 LAB — CBC
HCT: 28.5 % — ABNORMAL LOW (ref 39.0–52.0)
Hemoglobin: 8.9 g/dL — ABNORMAL LOW (ref 13.0–17.0)
MCH: 28.8 pg (ref 26.0–34.0)
MCHC: 31.2 g/dL (ref 30.0–36.0)
MCV: 92.2 fL (ref 78.0–100.0)
Platelets: 153 10*3/uL (ref 150–400)
RBC: 3.09 MIL/uL — ABNORMAL LOW (ref 4.22–5.81)
RDW: 16.6 % — AB (ref 11.5–15.5)
WBC: 5.1 10*3/uL (ref 4.0–10.5)

## 2013-05-31 LAB — BASIC METABOLIC PANEL
BUN: 43 mg/dL — AB (ref 6–23)
CALCIUM: 8.5 mg/dL (ref 8.4–10.5)
CO2: 23 mEq/L (ref 19–32)
Chloride: 104 mEq/L (ref 96–112)
Creatinine, Ser: 3.11 mg/dL — ABNORMAL HIGH (ref 0.50–1.35)
GFR calc Af Amer: 24 mL/min — ABNORMAL LOW (ref >90)
GFR, EST NON AFRICAN AMERICAN: 20 mL/min — AB (ref >90)
Glucose, Bld: 156 mg/dL — ABNORMAL HIGH (ref 70–99)
POTASSIUM: 5.8 meq/L — AB (ref 3.7–5.3)
SODIUM: 138 meq/L (ref 137–147)

## 2013-05-31 MED ORDER — SODIUM POLYSTYRENE SULFONATE 15 GM/60ML PO SUSP
30.0000 g | Freq: Once | ORAL | Status: AC
  Administered 2013-05-31: 30 g via ORAL
  Filled 2013-05-31 (×2): qty 120

## 2013-05-31 NOTE — Progress Notes (Signed)
POD#3 s/p left radical nephrectomy  S/Intv: No complaints, pain controlled +flatus, foley out and voiding without issue  PE: Filed Vitals:   05/30/13 0614 05/30/13 1443 05/30/13 2128 05/31/13 0631  BP: 145/89 142/76 131/74 136/90  Pulse: 114 121 98 94  Temp: 98.6 F (37 C) 98.9 F (37.2 C) 98.7 F (37.1 C) 97.9 F (36.6 C)  TempSrc: Oral Oral Oral Oral  Resp: 20 22 18 18   Height:      Weight:      SpO2: 94% 94% 100% 98%    Intake/Output Summary (Last 24 hours) at 05/31/13 0825 Last data filed at 05/31/13 [id number]  Gross per 24 hour  Intake   2080 ml  Output   2225 ml  Net   -145 ml  NAD Abdomen is soft, incision with ecchymosis, tender to palpation Ext Symmetric   Recent Labs  05/29/13 0540 05/30/13 0604 05/31/13 0340  WBC 7.3 7.3 5.1  HGB 10.6* 10.6* 8.9*  HCT 33.9* 32.9* 28.5*    Recent Labs  05/29/13 0540 05/30/13 0604 05/31/13 0340  NA 135* 137 138  K 4.9 5.2 5.8*  CL 98 102 104  CO2 24 23 23   GLUCOSE 137* 132* 156*  BUN 44* 41* 43*  CREATININE 2.80* 3.01* 3.11*  CALCIUM 8.4 8.6 8.5   No results found for this basename: LABPT, INR,  in the last 72 hours No results found for this basename: LABURIN,  in the last 72 hours Results for orders placed during the hospital encounter of 05/21/13  URINE CULTURE     Status: None   Collection Time    05/21/13  3:20 PM      Result Value Ref Range Status   Specimen Description URINE, RANDOM   Final   Special Requests NONE   Final   Culture  Setup Time     Final   Value: 05/22/2013 02:40     Performed at [facility]     Final   Value: NO GROWTH     Performed at [facility]   Culture     Final   Value: NO GROWTH     Performed at [facility]   Report Status 05/23/2013 FINAL   Final    Imp: S/p left nephrectomy, POD3 healing well Rising creatinine, elevated K Anemia from chronic Kidney disease, acute blood loss from surgery, and dilutional from IVF  Plan: Will  touch base with nephrology today regarding f/u Change diet to low K D/c IVF Repeat labs tomorrow Expect d/c tomorrow.

## 2013-05-31 NOTE — Progress Notes (Signed)
Pt is very upset that he has to take Kayexelate due to high Potassium. He said that because he was not on Phoslo  for 2 days after surgery while in the hospital that's why his K level was high.

## 2013-05-31 NOTE — Progress Notes (Signed)
[facility] PROGRESS NOTE  Brian Lawson [id number] DOB: November 13, 1955 DOA: 05/28/2013 PCP: Criselda Peaches, MD  Assessment/Plan: 1. CKD, stage 3  1. Baseline Cr at around 2. Today Cr up to 3.11 2. Still with good urine output 3. Pt is s/p L radical nephrectomy secondary to renal mass 4. Cont to avoid nephrotoxic agents 5. Follow renal fx closely 6. Agree with holding IVF for now 7. Agree with need for very close follow up with Nephrology 2. HTN  1. Initially poorly controlled post-operatively 2. Better after resuming home bp meds 3. Cont PRN IV hydralazine as already ordered by the primary service 3. L sided renal mass  1. Pt is s/p nephrectomy 4. Recent Gout Flare 1. Pt with known hx of gout of shoulder 2. Prednisone taper ordered 3/22 for gout 5. Tachycardia 1. Question acute pain in the setting of suspected acute gout flare 2. Steroid per above 3. Monitor - improved 6. DVT prophylaxis  1. SCD's   I will followup again tomorrow. Please contact me if I can be of assistance in the meanwhile. Thank you for this consultation.  Code Status: Full Family Communication: Pt in room Disposition Plan: Pending   HPI/Subjective: L shoulder pain improved.  Objective: Filed Vitals:   05/30/13 0614 05/30/13 1443 05/30/13 2128 05/31/13 0631  BP: 145/89 142/76 131/74 136/90  Pulse: 114 121 98 94  Temp: 98.6 F (37 C) 98.9 F (37.2 C) 98.7 F (37.1 C) 97.9 F (36.6 C)  TempSrc: Oral Oral Oral Oral  Resp: 20 22 18 18   Height:      Weight:      SpO2: 94% 94% 100% 98%    Intake/Output Summary (Last 24 hours) at 05/31/13 1255 Last data filed at 05/31/13 1100  Gross per 24 hour  Intake   2200 ml  Output   2225 ml  Net    -25 ml   Filed Weights   05/28/13 1400  Weight: 101.52 kg (223 lb 13 oz)    Exam:   General:  Awake, in nad  Cardiovascular: regular, s1, s2  Respiratory: normal resp effort, no wheezing  Abdomen: soft, nondistended  Musculoskeletal:  perfused, no clubbing, L shoulder tenderness  Data Reviewed: Basic Metabolic Panel:  Recent Labs Lab 05/28/13 0733 05/28/13 1336 05/29/13 0540 05/30/13 0604 05/31/13 0340  NA 137 137 135* 137 138  K 4.7 4.9 4.9 5.2 5.8*  CL  --  99 98 102 104  CO2  --  21 24 23 23   GLUCOSE 143* 173* 137* 132* 156*  BUN  --  43* 44* 41* 43*  CREATININE  --  2.10* 2.80* 3.01* 3.11*  CALCIUM  --  8.4 8.4 8.6 8.5   Liver Function Tests: No results found for this basename: AST, ALT, ALKPHOS, BILITOT, PROT, ALBUMIN,  in the last 168 hours No results found for this basename: LIPASE, AMYLASE,  in the last 168 hours No results found for this basename: AMMONIA,  in the last 168 hours CBC:  Recent Labs Lab 05/28/13 0733 05/28/13 1336 05/29/13 0540 05/30/13 0604 05/31/13 0340  WBC  --  11.1* 7.3 7.3 5.1  HGB 13.6 12.9* 10.6* 10.6* 8.9*  HCT 40.0 40.2 33.9* 32.9* 28.5*  MCV  --  92.0 92.6 91.9 92.2  PLT  --  134* 129* 154 153   Cardiac Enzymes: No results found for this basename: CKTOTAL, CKMB, CKMBINDEX, TROPONINI,  in the last 168 hours BNP (last 3 results) No results found for this basename: PROBNP,  in the last 8760 hours CBG: No results found for this basename: GLUCAP,  in the last 168 hours  Recent Results (from the past 240 hour(s))  URINE CULTURE     Status: None   Collection Time    05/21/13  3:20 PM      Result Value Ref Range Status   Specimen Description URINE, RANDOM   Final   Special Requests NONE   Final   Culture  Setup Time     Final   Value: 05/22/2013 02:40     Performed at [place]     Final   Value: NO GROWTH     Performed at [facility]   Culture     Final   Value: NO GROWTH     Performed at [facility]   Report Status 05/23/2013 FINAL   Final     Studies: No results found.  Scheduled Meds: . calcium acetate  1,334 mg Oral TID WC  . cloNIDine  0.2 mg Oral TID  . heparin subcutaneous  5,000 Units Subcutaneous  Q12H  . hydrALAZINE  50 mg Oral TID  . isosorbide dinitrate  5 mg Oral BID  . pantoprazole  40 mg Oral Daily  . predniSONE  60 mg Oral Q breakfast  . sodium bicarbonate  650 mg Oral BID   Continuous Infusions:    Active Problems:   HYPERTENSION   OSTEOARTHRITIS   Renal mass, left   CKD (chronic kidney disease) stage 3, GFR 30-59 ml/min   Renal mass  Time spent: 44min  CHIU, [place] [facility] Pager [phone]. If 7PM-7AM, please contact night-coverage at [email or web address], password [facility] 05/31/2013, 12:55 PM  LOS: 3 days

## 2013-05-31 NOTE — Consult Note (Signed)
Renal Service Consult Note [facility]  Brian Lawson 05/31/2013 Brian Lawson D Requesting Physician:  Brian Lawson  Reason for Consult:  Acute on CRF after nephrectomy HPI: The patient is a 58 y.o. year-old with hx of HTN, gout, and CKD admitted for left nephrectomy. Patient was admitted here last year with AKI, septic shock with legionella PNA and had a left renal mass identified by CT , 9cm in size.  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  Outpatient Cr 10 days ago was 2.08.  Admit creat was 2.80 preop and today is 3.11 postop.  It was 3.01 yesterday.  UOP was 2L the last two days.    Chart Review 2001 > inguinal hernia repair 10/24-11/17/2014  Found down, recent diarrheal illness, temp 105F, acute resp failure/ legionella PNA, left renal mass 9cm, AKI, malig HTN, gout flare, ^K+. Treated with abx for PNA. Creat admit was 3.8, peaked at 8.6 then improved to 2.4 at discharge. An AVF was placed by [facility], did not require dialysis.  Urol saw pt and said he would need nephrectomy when more stable medically.   ROS  no cp, no cough or SOB  no abd pain, n/v/d  walking in room and halls  no constipation or diarrhea  no jt pain or skin rash  Past Medical History  Past Medical History  Diagnosis Date  . Hypertension   . Gout   . Hepatitis     30 years ago  . Arthritis     OA  . Chronic kidney disease     ACUTE RENAL FAILURE 12/2012 STATUS POST RT ARM AV FISTULA - HAS NOT STARTED DIALYSIS; LEFT RENAL MASS  . Pneumonia     HOSPITALIZED 12/2012 LEGIONLLA PNEUMONIA   Past Surgical History  Past Surgical History  Procedure Laterality Date  . Hernia repair    . Bascilic vein transposition Right 01/18/2013    Procedure: BASCILIC VEIN TRANSPOSITION;  Surgeon: Brian Dutch, MD;  Location: [place];  Service: Vascular;  Laterality: Right;  . Bascilic vein transposition Right 04/27/2013    Procedure: BASCILIC VEIN  TRANSPOSITION AND REVISION;  Surgeon: Brian Dutch, MD;  Location: [place];  Service: Vascular;  Laterality: Right;  . Laparoscopic nephrectomy Left 05/28/2013    Procedure: LEFT LAPAROSCOPIC  RADICAL NEPHRECTOMY;  Surgeon: Brian Hughs, MD;  Location: [facility];  Service: Urology;  Laterality: Left;   Family History  Family History  Problem Relation Age of Onset  . Hypertension Mother    Social History  reports that he has quit smoking. He has never used smokeless tobacco. He reports that he drinks alcohol. He reports that he does not use illicit drugs. Allergies No Known Allergies Home medications Prior to Admission medications   Medication Sig Start Date End Date Taking? Authorizing Lawson  calcitRIOL (ROCALTROL) 0.25 MCG capsule Take 0.25 mcg by mouth every morning.  01/25/13  Yes Brian Saralyn Pilar, MD  calcium acetate (PHOSLO) 667 MG capsule Take 1,334 mg by mouth 3 (three) times daily with meals. 01/25/13  Yes Brian Saralyn Pilar, MD  cloNIDine (CATAPRES) 0.2 MG tablet Take 0.2 mg by mouth 3 (three) times daily. 01/25/13  Yes Brian Saralyn Pilar, MD  hydrALAZINE (APRESOLINE) 50 MG tablet Take 50 mg by mouth 3 (three) times daily. 01/25/13  Yes Brian Saralyn Pilar, MD  isosorbide dinitrate (ISORDIL) 5 MG tablet Take 5 mg by mouth 2 (two) times daily. 01/25/13  Yes  Brian Oats, MD  omeprazole (PRILOSEC) 40 MG capsule Take 40 mg by mouth daily. 01/25/13  Yes Brian C Viyuoh, MD  oxyCODONE (ROXICODONE) 5 MG immediate release tablet Take 1 tablet (5 mg total) by mouth every 6 (six) hours as needed for severe pain. 04/27/13  Yes Brian J Rhyne, Brian Lawson  predniSONE (DELTASONE) 10 MG tablet Take 10 mg by mouth daily with breakfast.   Yes Brian Provider, MD  sodium bicarbonate 650 MG tablet Take 650 mg by mouth 2 (two) times daily. 01/25/13  Yes Brian Saralyn Pilar, MD  diclofenac sodium (VOLTAREN) 1 % GEL Apply 2 g topically 4 (four) times daily as needed (pain - FROM GOUT).     Brian  Provider, MD   Liver Function Tests No results found for this basename: AST, ALT, ALKPHOS, BILITOT, PROT, ALBUMIN,  in the last 168 hours No results found for this basename: LIPASE, AMYLASE,  in the last 168 hours CBC  Recent Labs Lab 05/29/13 0540 05/30/13 0604 05/31/13 0340  WBC 7.3 7.3 5.1  HGB 10.6* 10.6* 8.9*  HCT 33.9* 32.9* 28.5*  MCV 92.6 91.9 92.2  PLT 129* 154 211   Basic Metabolic Panel  Recent Labs Lab 05/28/13 0733 05/28/13 1336 05/29/13 0540 05/30/13 0604 05/31/13 0340  NA 137 137 135* 137 138  K 4.7 4.9 4.9 5.2 5.8*  CL  --  99 98 102 104  CO2  --  21 24 23 23   GLUCOSE 143* 173* 137* 132* 156*  BUN  --  43* 44* 41* 43*  CREATININE  --  2.10* 2.80* 3.01* 3.11*  CALCIUM  --  8.4 8.4 8.6 8.5    Filed Vitals:   05/30/13 0614 05/30/13 1443 05/30/13 2128 05/31/13 0631  BP: 145/89 142/76 131/74 136/90  Pulse: 114 121 98 94  Temp: 98.6 F (37 C) 98.9 F (37.2 C) 98.7 F (37.1 C) 97.9 F (36.6 C)  TempSrc: Oral Oral Oral Oral  Resp: 20 22 18 18   Height:      Weight:      SpO2: 94% 94% 100% 98%   Exam Alert, calm, cheerful No rash, cyanosis or gangrene Sclera anicteric, throat clear No jvd Chest clear bilat RRR no MRG Abd obese, soft, nt, nd No LE edema Neuro is nf, ox3 RUA AVF is patent and maturing nicely     Assessment: 1 Acute on CKD due to nephrectomy 2 CKD stage 3/4 3 Hyperkalemia, mild- due to renal failure, diet and/or heparin (blocks K+ secretion in kidney) 4 HTN on clonidine / hydralazine 5 Met acidosis on po NaHCO3 6 S/P AV fistula 01/18/13, revised 04/27/13   Plan- patient is not uremic and does not need dialysis.  Will give Kayexalate x 1, stop SQ hep, and diet changed to renal.  When creat and K level off patient can go home.  He has OP f/u at [facility] with Brian Lawson.    Brian Splinter MD (pgr) [phone]    ([id number] 05/31/2013, 1:46 PM

## 2013-06-01 LAB — BASIC METABOLIC PANEL
BUN: 53 mg/dL — AB (ref 6–23)
CO2: 24 mEq/L (ref 19–32)
CREATININE: 3.02 mg/dL — AB (ref 0.50–1.35)
Calcium: 9 mg/dL (ref 8.4–10.5)
Chloride: 104 mEq/L (ref 96–112)
GFR, EST AFRICAN AMERICAN: 24 mL/min — AB (ref >90)
GFR, EST NON AFRICAN AMERICAN: 21 mL/min — AB (ref >90)
Glucose, Bld: 153 mg/dL — ABNORMAL HIGH (ref 70–99)
POTASSIUM: 5.3 meq/L (ref 3.7–5.3)
Sodium: 139 mEq/L (ref 137–147)

## 2013-06-01 LAB — CBC
HCT: 27.2 % — ABNORMAL LOW (ref 39.0–52.0)
Hemoglobin: 8.9 g/dL — ABNORMAL LOW (ref 13.0–17.0)
MCH: 29.7 pg (ref 26.0–34.0)
MCHC: 32.7 g/dL (ref 30.0–36.0)
MCV: 90.7 fL (ref 78.0–100.0)
Platelets: 170 10*3/uL (ref 150–400)
RBC: 3 MIL/uL — ABNORMAL LOW (ref 4.22–5.81)
RDW: 16 % — AB (ref 11.5–15.5)
WBC: 6.2 10*3/uL (ref 4.0–10.5)

## 2013-06-01 MED ORDER — DOCUSATE SODIUM 100 MG PO CAPS: 100.0000 mg | ORAL_CAPSULE | Freq: Two times a day (BID) | ORAL | Status: DC | PRN

## 2013-06-01 MED ORDER — PREDNISONE 20 MG PO TABS: 60.0000 mg | ORAL_TABLET | Freq: Every day | ORAL | Status: DC

## 2013-06-01 MED ORDER — HYDRALAZINE HCL 50 MG PO TABS
75.0000 mg | ORAL_TABLET | Freq: Three times a day (TID) | ORAL | Status: DC
  Administered 2013-06-01: 75 mg via ORAL
  Filled 2013-06-01 (×2): qty 1

## 2013-06-01 MED ORDER — HYDRALAZINE HCL 25 MG PO TABS: 75.0000 mg | ORAL_TABLET | Freq: Three times a day (TID) | ORAL | Status: DC

## 2013-06-01 MED ORDER — OXYCODONE HCL 5 MG PO TABS: 5.0000 mg | ORAL_TABLET | ORAL | Status: DC | PRN

## 2013-06-01 NOTE — Progress Notes (Addendum)
[facility] PROGRESS NOTE  Brian Lawson [id number] DOB: Sep 28, 1955 DOA: 05/28/2013 PCP: Criselda Peaches, MD  Assessment/Plan: 1. CKD, stage 3  1. Baseline Cr at around 2. Today Cr stable at around 3 2. Still with good urine output 3. Pt is s/p L radical nephrectomy secondary to renal mass 4. Cont to avoid nephrotoxic agents 5. Follow renal fx closely 6. Agree with holding IVF for now 7. Nephrology recs noted 2. HTN  1. Initially poorly controlled post-operatively 2. Elevated overnight 3. Cont PRN IV hydralazine as already ordered by the primary service 4. Increased scheduled hydralazine to 75mg  from 50mg  - I prescribed this in the discharge orders 3. L sided renal mass  1. Pt is s/p nephrectomy 4. Recent Gout Flare 1. Pt with known hx of gout of shoulder 2. Prednisone taper ordered 3/22 for gout 3. Rx for outpatient steroid taper prescribed 5. Tachycardia 1. Question acute pain in the setting of suspected acute gout flare 2. Steroid per above 3. Monitor - improved 6. DVT prophylaxis  1. SCD's   OK to d/c with close PCP and Nephrology follow ups. Thank you again for this consultation.    HPI/Subjective: L shoulder pain continuing to improve  Objective: Filed Vitals:   05/31/13 2125 06/01/13 0500 06/01/13 0615 06/01/13 0915  BP: 162/92 192/101 144/96 158/95  Pulse: 95 95 76 92  Temp:  97.5 F (36.4 C)    TempSrc:  Oral    Resp:  20    Height:      Weight:      SpO2:  100%      Intake/Output Summary (Last 24 hours) at 06/01/13 1232 Last data filed at 06/01/13 1036  Gross per 24 hour  Intake    360 ml  Output   1475 ml  Net  -1115 ml   Filed Weights   05/28/13 1400  Weight: 101.52 kg (223 lb 13 oz)    Exam:   General:  Awake, in nad  Cardiovascular: regular, s1, s2  Respiratory: normal resp effort, no wheezing  Abdomen: soft, nondistended  Musculoskeletal: perfused, no clubbing, L shoulder tenderness  Data Reviewed: Basic  Metabolic Panel:  Recent Labs Lab 05/28/13 1336 05/29/13 0540 05/30/13 0604 05/31/13 0340 06/01/13 0355  NA 137 135* 137 138 139  K 4.9 4.9 5.2 5.8* 5.3  CL 99 98 102 104 104  CO2 21 24 23 23 24   GLUCOSE 173* 137* 132* 156* 153*  BUN 43* 44* 41* 43* 53*  CREATININE 2.10* 2.80* 3.01* 3.11* 3.02*  CALCIUM 8.4 8.4 8.6 8.5 9.0   Liver Function Tests: No results found for this basename: AST, ALT, ALKPHOS, BILITOT, PROT, ALBUMIN,  in the last 168 hours No results found for this basename: LIPASE, AMYLASE,  in the last 168 hours No results found for this basename: AMMONIA,  in the last 168 hours CBC:  Recent Labs Lab 05/28/13 1336 05/29/13 0540 05/30/13 0604 05/31/13 0340 06/01/13 0355  WBC 11.1* 7.3 7.3 5.1 6.2  HGB 12.9* 10.6* 10.6* 8.9* 8.9*  HCT 40.2 33.9* 32.9* 28.5* 27.2*  MCV 92.0 92.6 91.9 92.2 90.7  PLT 134* 129* 154 153 170   Cardiac Enzymes: No results found for this basename: CKTOTAL, CKMB, CKMBINDEX, TROPONINI,  in the last 168 hours BNP (last 3 results) No results found for this basename: PROBNP,  in the last 8760 hours CBG: No results found for this basename: GLUCAP,  in the last 168 hours  No results found for this or any  previous visit (from the past 240 hour(s)).   Studies: No results found.  Scheduled Meds: . calcium acetate  1,334 mg Oral TID WC  . cloNIDine  0.2 mg Oral TID  . hydrALAZINE  75 mg Oral TID  . isosorbide dinitrate  5 mg Oral BID  . pantoprazole  40 mg Oral Daily  . predniSONE  60 mg Oral Q breakfast  . sodium bicarbonate  650 mg Oral BID   Continuous Infusions:    Active Problems:   HYPERTENSION   OSTEOARTHRITIS   CKD (chronic kidney disease) stage 3, GFR 30-59 ml/min   Renal mass  Time spent: 74min  Dody Smartt, [facility] Pager [phone]. If 7PM-7AM, please contact night-coverage at [email or web address], password [facility] 06/01/2013, 12:32 PM  LOS: 4 days

## 2013-06-01 NOTE — Progress Notes (Signed)
  [facility] Progress Note   Subjective: no distress  Filed Vitals:   05/31/13 2039 05/31/13 2125 06/01/13 0500 06/01/13 0615  BP: 157/101 162/92 192/101 144/96  Pulse: 98 95 95 76  Temp: 97.6 F (36.4 C)  97.5 F (36.4 C)   TempSrc: Oral  Oral   Resp: 20  20   Height:      Weight:      SpO2: 99%  100%    Exam: Alert, calm, cheerful  No jvd  Chest clear bilat  RRR no MRG  Abd obese, soft, nt, nd  No LE edema  Neuro is nf, ox3  RUA AVF is patent and maturing nicely  Assessment:  1 Acute on CKD due to nephrectomy- Cr stable now, will have new baseline 2 CKD stage 3/4  3 Hyperkalemia- improved 4 HTN on clonidine / hydralazine  5 Met acidosis on po NaHCO3  6 S/P AV fistula 01/18/13, revised 04/27/13    Plan- ok for discharge from renal standpoint.  Pt has OP f/u at [place] with Dr Justin Mend.        Kelly Splinter MD  pager [phone]    cell [phone]  06/01/2013, 8:04 AM     Recent Labs Lab 05/30/13 0604 05/31/13 0340 06/01/13 0355  NA 137 138 139  K 5.2 5.8* 5.3  CL 102 104 104  CO2 _0 GLUCOSE 132* 156* 153*  BUN 41* 43* 53*  CREATININE 3.01* 3.11* 3.02*  CALCIUM 8.6 8.5 9.0   No results found for this basename: AST, ALT, ALKPHOS, BILITOT, PROT, ALBUMIN,  in the last 168 hours  Recent Labs Lab 05/30/13 0604 05/31/13 0340 06/01/13 0355  WBC 7.3 5.1 6.2  HGB 10.6* 8.9* 8.9*  HCT 32.9* 28.5* 27.2*  MCV 91.9 92.2 90.7  PLT 154 153 170   . calcium acetate  1,334 mg Oral TID WC  . cloNIDine  0.2 mg Oral TID  . hydrALAZINE  50 mg Oral TID  . isosorbide dinitrate  5 mg Oral BID  . pantoprazole  40 mg Oral Daily  . predniSONE  60 mg Oral Q breakfast  . sodium bicarbonate  650 mg Oral BID     bisacodyl, hydrALAZINE, ondansetron, oxyCODONE, simethicone, zolpidem

## 2013-06-01 NOTE — Discharge Instructions (Signed)

## 2013-06-01 NOTE — Progress Notes (Signed)
POD#4 s/p left radical nephrectomy  S/Intv: No complaints, pain controlled +flatus, foley out and voiding without issue  PE: Filed Vitals:   05/31/13 2039 05/31/13 2125 06/01/13 0500 06/01/13 0615  BP: 157/101 162/92 192/101 144/96  Pulse: 98 95 95 76  Temp: 97.6 F (36.4 C)  97.5 F (36.4 C)   TempSrc: Oral  Oral   Resp: 20  20   Height:      Weight:      SpO2: 99%  100%     Intake/Output Summary (Last 24 hours) at 06/01/13 0735 Last data filed at 06/01/13 0553  Gross per 24 hour  Intake    480 ml  Output   1175 ml  Net   -695 ml  NAD Abdomen is soft, incision with ecchymosis, tender to palpation Ext Symmetric   Recent Labs  05/30/13 0604 05/31/13 0340 06/01/13 0355  WBC 7.3 5.1 6.2  HGB 10.6* 8.9* 8.9*  HCT 32.9* 28.5* 27.2*    Recent Labs  05/30/13 0604 05/31/13 0340 06/01/13 0355  NA 137 138 139  K 5.2 5.8* 5.3  CL 102 104 104  CO2 23 23 24   GLUCOSE 132* 156* 153*  BUN 41* 43* 53*  CREATININE 3.01* 3.11* 3.02*  CALCIUM 8.6 8.5 9.0   No results found for this basename: LABPT, INR,  in the last 72 hours No results found for this basename: LABURIN,  in the last 72 hours Results for orders placed during the hospital encounter of 05/21/13  URINE CULTURE     Status: None   Collection Time    05/21/13  3:20 PM      Result Value Ref Range Status   Specimen Description URINE, RANDOM   Final   Special Requests NONE   Final   Culture  Setup Time     Final   Value: 05/22/2013 02:40     Performed at [facility]     Final   Value: NO GROWTH     Performed at [facility]   Culture     Final   Value: NO GROWTH     Performed at [facility]   Report Status 05/23/2013 FINAL   Final    Imp: S/p left nephrectomy, POD4 healing well Creatinine stable today, K stablized. Anemia stable  Plan: Plan for discharge home today. F/u with Nephrology as arranged. Will defer to Dr. Wyline Copas for any changes to medications for  HTN and electrolyte balance.  Please print any Rx appropriate and place on chart.  Appreciate help with patient. I have provided Rx for pain and constipation. D/c to patient's mother's house.  Shouldn't need any services at discharge.

## 2013-06-01 NOTE — Discharge Summary (Signed)
Date of admission: 05/28/2013  Date of discharge: 06/01/2013  Admission diagnosis: left rena lmass  Discharge diagnosis: left renal mass, HTN, chronic renal insufficiency  Secondary diagnoses:  Patient Active Problem List   Diagnosis Date Noted  . CKD (chronic kidney disease) stage 3, GFR 30-59 ml/min 05/28/2013  . Renal mass 05/28/2013  . Legionella pneumonia 01/02/2013  . GOUT 12/15/2006  . HYPERTENSION 12/15/2006  . OSTEOARTHRITIS 12/15/2006  . HEPATITIS B, HX OF 12/15/2006    History and Physical: For full details, please see admission history and physical. Briefly, Brian Lawson is a 58 y.o. year old patient with incidentally discovered left renal mass.   Hospital Course: Patient tolerated the procedure well.  He was then transferred to the floor after an uneventful PACU stay.  His hospital course was uncomplicated.  On POD#4  he had met discharge criteria: was eating a regular diet, was up and ambulating independently,  pain was well controlled, was voiding without a catheter, and was ready to for discharge.   Laboratory values:   Recent Labs  05/30/13 0604 05/31/13 0340 06/01/13 0355  WBC 7.3 5.1 6.2  HGB 10.6* 8.9* 8.9*  HCT 32.9* 28.5* 27.2*    Recent Labs  05/30/13 0604 05/31/13 0340 06/01/13 0355  NA 137 138 139  K 5.2 5.8* 5.3  CL 102 104 104  CO2 _0 GLUCOSE 132* 156* 153*  BUN 41* 43* 53*  CREATININE 3.01* 3.11* 3.02*  CALCIUM 8.6 8.5 9.0   No results found for this basename: LABPT, INR,  in the last 72 hours No results found for this basename: LABURIN,  in the last 72 hours Results for orders placed during the hospital encounter of 05/21/13  URINE CULTURE     Status: None   Collection Time    05/21/13  3:20 PM      Result Value Ref Range Status   Specimen Description URINE, RANDOM   Final   Special Requests NONE   Final   Culture  Setup Time     Final   Value: 05/22/2013 02:40     Performed at [facility]      Final   Value: NO GROWTH     Performed at [facility]   Culture     Final   Value: NO GROWTH     Performed at [facility]   Report Status 05/23/2013 FINAL   Final    Disposition: Home  Discharge instruction: The patient was instructed to be ambulatory but told to refrain from heavy lifting, strenuous activity, or driving. Discharge medications:    Medication List         calcitRIOL 0.25 MCG capsule  Commonly known as:  ROCALTROL  Take 0.25 mcg by mouth every morning.     calcium acetate 667 MG capsule  Commonly known as:  PHOSLO  Take 1,334 mg by mouth 3 (three) times daily with meals.     cloNIDine 0.2 MG tablet  Commonly known as:  CATAPRES  Take 0.2 mg by mouth 3 (three) times daily.     diclofenac sodium 1 % Gel  Commonly known as:  VOLTAREN  Apply 2 g topically 4 (four) times daily as needed (pain - FROM GOUT).     docusate sodium 100 MG capsule  Commonly known as:  COLACE  Take 1 capsule (100 mg total) by mouth 2 (two) times daily as needed (take to keep stool soft.).  hydrALAZINE 25 MG tablet  Commonly known as:  APRESOLINE  Take 3 tablets (75 mg total) by mouth 3 (three) times daily.     isosorbide dinitrate 5 MG tablet  Commonly known as:  ISORDIL  Take 5 mg by mouth 2 (two) times daily.     omeprazole 40 MG capsule  Commonly known as:  PRILOSEC  Take 40 mg by mouth daily.     oxyCODONE 5 MG immediate release tablet  Commonly known as:  ROXICODONE  Take 1 tablet (5 mg total) by mouth every 4 (four) hours as needed for severe pain.     predniSONE 20 MG tablet  Commonly known as:  DELTASONE  Take 3 tablets (60 mg total) by mouth daily with breakfast.     predniSONE 10 MG tablet  Commonly known as:  DELTASONE  Take 10 mg by mouth daily with breakfast.     sodium bicarbonate 650 MG tablet  Take 650 mg by mouth 2 (two) times daily.        Followup:  Follow-up Information   Follow up with Ardis Hughs, MD On 06/10/2013.  (9:30)    Specialty:  Urology   Contact information:   [facility]  [place] [address] [phone]

## 2013-06-21 ENCOUNTER — Other Ambulatory Visit: Payer: Self-pay | Admitting: Vascular Surgery

## 2013-06-21 DIAGNOSIS — Z4931 Encounter for adequacy testing for hemodialysis: Secondary | ICD-10-CM

## 2013-06-21 DIAGNOSIS — N186 End stage renal disease: Secondary | ICD-10-CM

## 2013-06-23 ENCOUNTER — Emergency Department (HOSPITAL_COMMUNITY)
Admission: EM | Admit: 2013-06-23 | Discharge: 2013-06-23 | Disposition: A | Discharge status: Home / Self Care | Payer: Medicaid Other | Attending: Emergency Medicine | Admitting: Emergency Medicine

## 2013-06-23 ENCOUNTER — Emergency Department (HOSPITAL_COMMUNITY): Payer: Medicaid Other

## 2013-06-23 ENCOUNTER — Encounter (HOSPITAL_COMMUNITY): Payer: Self-pay | Admitting: Emergency Medicine

## 2013-06-23 ENCOUNTER — Encounter: Payer: Self-pay | Admitting: Vascular Surgery

## 2013-06-23 DIAGNOSIS — I129 Hypertensive chronic kidney disease with stage 1 through stage 4 chronic kidney disease, or unspecified chronic kidney disease: Secondary | ICD-10-CM | POA: Insufficient documentation

## 2013-06-23 DIAGNOSIS — Z8739 Personal history of other diseases of the musculoskeletal system and connective tissue: Secondary | ICD-10-CM | POA: Insufficient documentation

## 2013-06-23 DIAGNOSIS — Z859 Personal history of malignant neoplasm, unspecified: Secondary | ICD-10-CM | POA: Insufficient documentation

## 2013-06-23 DIAGNOSIS — L03319 Cellulitis of trunk, unspecified: Secondary | ICD-10-CM | POA: Insufficient documentation

## 2013-06-23 DIAGNOSIS — T8131XA Disruption of external operation (surgical) wound, not elsewhere classified, initial encounter: Secondary | ICD-10-CM | POA: Insufficient documentation

## 2013-06-23 DIAGNOSIS — Z8619 Personal history of other infectious and parasitic diseases: Secondary | ICD-10-CM | POA: Insufficient documentation

## 2013-06-23 DIAGNOSIS — Y838 Other surgical procedures as the cause of abnormal reaction of the patient, or of later complication, without mention of misadventure at the time of the procedure: Secondary | ICD-10-CM | POA: Insufficient documentation

## 2013-06-23 DIAGNOSIS — L02219 Cutaneous abscess of trunk, unspecified: Secondary | ICD-10-CM | POA: Insufficient documentation

## 2013-06-23 DIAGNOSIS — N189 Chronic kidney disease, unspecified: Secondary | ICD-10-CM | POA: Insufficient documentation

## 2013-06-23 DIAGNOSIS — Z8701 Personal history of pneumonia (recurrent): Secondary | ICD-10-CM | POA: Insufficient documentation

## 2013-06-23 DIAGNOSIS — T8149XA Infection following a procedure, other surgical site, initial encounter: Secondary | ICD-10-CM

## 2013-06-23 DIAGNOSIS — Z8639 Personal history of other endocrine, nutritional and metabolic disease: Secondary | ICD-10-CM | POA: Insufficient documentation

## 2013-06-23 DIAGNOSIS — Z87891 Personal history of nicotine dependence: Secondary | ICD-10-CM | POA: Insufficient documentation

## 2013-06-23 DIAGNOSIS — Z862 Personal history of diseases of the blood and blood-forming organs and certain disorders involving the immune mechanism: Secondary | ICD-10-CM | POA: Insufficient documentation

## 2013-06-23 DIAGNOSIS — Z79899 Other long term (current) drug therapy: Secondary | ICD-10-CM | POA: Insufficient documentation

## 2013-06-23 HISTORY — DX: Malignant (primary) neoplasm, unspecified: C80.1

## 2013-06-23 LAB — CBC WITH DIFFERENTIAL/PLATELET
Basophils Absolute: 0 10*3/uL (ref 0.0–0.1)
Basophils Relative: 0 % (ref 0–1)
Eosinophils Absolute: 0 10*3/uL (ref 0.0–0.7)
Eosinophils Relative: 0 % (ref 0–5)
HCT: 35.6 % — ABNORMAL LOW (ref 39.0–52.0)
Hemoglobin: 11.2 g/dL — ABNORMAL LOW (ref 13.0–17.0)
Lymphocytes Relative: 21 % (ref 12–46)
Lymphs Abs: 2.1 10*3/uL (ref 0.7–4.0)
MCH: 28.6 pg (ref 26.0–34.0)
MCHC: 31.5 g/dL (ref 30.0–36.0)
MCV: 91 fL (ref 78.0–100.0)
Monocytes Absolute: 0.7 10*3/uL (ref 0.1–1.0)
Monocytes Relative: 7 % (ref 3–12)
Neutro Abs: 7 10*3/uL (ref 1.7–7.7)
Neutrophils Relative %: 72 % (ref 43–77)
Platelets: 230 10*3/uL (ref 150–400)
RBC: 3.91 MIL/uL — ABNORMAL LOW (ref 4.22–5.81)
RDW: 15 % (ref 11.5–15.5)
WBC: 9.8 10*3/uL (ref 4.0–10.5)

## 2013-06-23 LAB — BASIC METABOLIC PANEL
BUN: 52 mg/dL — ABNORMAL HIGH (ref 6–23)
CO2: 26 mEq/L (ref 19–32)
Calcium: 10.1 mg/dL (ref 8.4–10.5)
Chloride: 98 mEq/L (ref 96–112)
Creatinine, Ser: 3.88 mg/dL — ABNORMAL HIGH (ref 0.50–1.35)
GFR calc Af Amer: 18 mL/min — ABNORMAL LOW (ref >90)
GFR calc non Af Amer: 16 mL/min — ABNORMAL LOW (ref >90)
Glucose, Bld: 130 mg/dL — ABNORMAL HIGH (ref 70–99)
Potassium: 4.7 mEq/L (ref 3.7–5.3)
Sodium: 140 mEq/L (ref 137–147)

## 2013-06-23 MED ORDER — VANCOMYCIN HCL IN DEXTROSE 1-5 GM/200ML-% IV SOLN: 1000.0000 mg | INTRAVENOUS | Status: DC

## 2013-06-23 MED ORDER — CEPHALEXIN 500 MG PO CAPS: 500.0000 mg | ORAL_CAPSULE | Freq: Four times a day (QID) | ORAL | Status: DC

## 2013-06-23 MED ORDER — VANCOMYCIN HCL 10 G IV SOLR
1500.0000 mg | Freq: Once | INTRAVENOUS | Status: AC
  Administered 2013-06-23: 1500 mg via INTRAVENOUS
  Filled 2013-06-23: qty 1500

## 2013-06-23 NOTE — ED Provider Notes (Signed)
CSN: [id number]     Arrival date & time 06/23/13  1815 History   First MD Initiated Contact with Patient 06/23/13 1853     Chief Complaint  Patient presents with  . Wound Dehiscence     (Consider location/radiation/quality/duration/timing/severity/associated sxs/prior Treatment) HPI  58yM with drainage from surgical incision. S/p L nephrectomy about 3w ago. Just prior to arrival had sudden onset of "a lot" of bloody appearing drainage. No significant increase in pain. Denies trauma. No fever or chills. No n/v. No other acute complaints.   Past Medical History  Diagnosis Date  . Hypertension   . Gout   . Hepatitis     30 years ago  . Arthritis     OA  . Chronic kidney disease     ACUTE RENAL FAILURE 12/2012 STATUS POST RT ARM AV FISTULA - HAS NOT STARTED DIALYSIS; LEFT RENAL MASS  . Pneumonia     HOSPITALIZED 12/2012 LEGIONLLA PNEUMONIA  . Cancer renal lt nephrectomy   Past Surgical History  Procedure Laterality Date  . Hernia repair    . Bascilic vein transposition Right 01/18/2013    Procedure: BASCILIC VEIN TRANSPOSITION;  Surgeon: Elam Dutch, MD;  Location: [facility];  Service: Vascular;  Laterality: Right;  . Bascilic vein transposition Right 04/27/2013    Procedure: BASCILIC VEIN TRANSPOSITION AND REVISION;  Surgeon: Elam Dutch, MD;  Location: [facility];  Service: Vascular;  Laterality: Right;  . Laparoscopic nephrectomy Left 05/28/2013    Procedure: LEFT LAPAROSCOPIC  RADICAL NEPHRECTOMY;  Surgeon: Ardis Hughs, MD;  Location: [facility];  Service: Urology;  Laterality: Left;   Family History  Problem Relation Age of Onset  . Hypertension Mother    History  Substance Use Topics  . Smoking status: Former Research scientist (life sciences)  . Smokeless tobacco: Never Used  . Alcohol Use: Yes     Comment: ocassionally  QUIT SMOKING IN 2014    Review of Systems  All systems reviewed and negative, other than as noted in HPI.   Allergies  Review of patient's allergies indicates no  known allergies.  Home Medications   Prior to Admission medications   Medication Sig Start Date End Date Taking? Authorizing Provider  calcitRIOL (ROCALTROL) 0.25 MCG capsule Take 0.25 mcg by mouth every morning.  01/25/13  Yes Adeline Saralyn Pilar, MD  calcium acetate (PHOSLO) 667 MG capsule Take 1,334 mg by mouth 3 (three) times daily with meals. 01/25/13  Yes Adeline Saralyn Pilar, MD  cloNIDine (CATAPRES) 0.2 MG tablet Take 0.2 mg by mouth 3 (three) times daily. 01/25/13  Yes Adeline Saralyn Pilar, MD  docusate sodium (COLACE) 100 MG capsule Take 100 mg by mouth 2 (two) times daily.   Yes Historical Provider, MD  hydrALAZINE (APRESOLINE) 25 MG tablet Take 25 mg by mouth 3 (three) times daily.   Yes Historical Provider, MD  hydrALAZINE (APRESOLINE) 50 MG tablet Take 50 mg by mouth 3 (three) times daily.   Yes Historical Provider, MD  isosorbide dinitrate (ISORDIL) 5 MG tablet Take 5 mg by mouth 2 (two) times daily. 01/25/13  Yes Adeline Saralyn Pilar, MD  omeprazole (PRILOSEC) 40 MG capsule Take 40 mg by mouth every morning.  01/25/13  Yes Adeline Saralyn Pilar, MD  predniSONE (DELTASONE) 20 MG tablet Take 20 mg by mouth daily with breakfast.   Yes Historical Provider, MD  sodium bicarbonate 650 MG tablet Take 650 mg by mouth 2 (two) times daily. 01/25/13  Yes Adeline Saralyn Pilar, MD  cephALEXin (KEFLEX) 500  MG capsule Take 1 capsule (500 mg total) by mouth 4 (four) times daily. 06/23/13   Virgel Manifold, MD  oxyCODONE (ROXICODONE) 5 MG immediate release tablet Take 1 tablet (5 mg total) by mouth every 4 (four) hours as needed for severe pain. 06/01/13   Ardis Hughs, MD   BP 142/92  Pulse 74  Temp(Src) 98.5 F (36.9 C) (Oral)  Resp 20  Ht 6\' 3"  (1.905 m)  Wt 220 lb (99.791 kg)  BMI 27.50 kg/m2  SpO2 97% Physical Exam  Nursing note and vitals reviewed. Constitutional: He appears well-developed and well-nourished. No distress.  Laying in bed. NAD.   HENT:  Head: Normocephalic and atraumatic.  Eyes:  Conjunctivae are normal. Right eye exhibits no discharge. Left eye exhibits no discharge.  Neck: Neck supple.  Cardiovascular: Normal rate, regular rhythm and normal heart sounds.  Exam reveals no gallop and no friction rub.   No murmur heard. Pulmonary/Chest: Effort normal and breath sounds normal. No respiratory distress.  Abdominal: Soft.  What appears to be a single incision inferior/medial L abdomen with small amount of dehiscence on both sides of it. Fould smelling purulent drainage from either side. Localized tenderness. No cellulitis. Other surgical incision appearing to be healing w/o complication  Musculoskeletal: He exhibits no edema and no tenderness.  Neurological: He is alert.  Skin: Skin is warm and dry. He is not diaphoretic.  Psychiatric: He has a normal mood and affect. His behavior is normal. Thought content normal.    ED Course  Procedures (including critical care time) Labs Review Labs Reviewed  CBC WITH DIFFERENTIAL - Abnormal; Notable for the following:    RBC 3.91 (*)    Hemoglobin 11.2 (*)    HCT 35.6 (*)    All other components within normal limits  BASIC METABOLIC PANEL - Abnormal; Notable for the following:    Glucose, Bld 130 (*)    BUN 52 (*)    Creatinine, Ser 3.88 (*)    GFR calc non Af Amer 16 (*)    GFR calc Af Amer 18 (*)    All other components within normal limits  WOUND CULTURE    Imaging Review Ct Abdomen Pelvis Wo Contrast  06/23/2013   CLINICAL DATA:  Purulent drainage from laparoscopic incision. Left nephrectomy 05/28/2013.  EXAM: CT ABDOMEN AND PELVIS WITHOUT CONTRAST  TECHNIQUE: Multidetector CT imaging of the abdomen and pelvis was performed following the standard protocol without intravenous contrast.  COMPARISON:  04/08/2013  FINDINGS: Lung bases demonstrate minimal bibasilar linear atelectatic change.  Abdominal images demonstrate evidence of patient's prior left nephrectomy. The spleen, liver, pancreas and right adrenal gland are  within normal. Gallbladder is contracted. Right kidney is within normal. Right ureter is normal. The appendix is normal. There is no significant free fluid or inflammatory change in the abdomen. There is mild fecal retention throughout the colon. There is minimal calcified plaque over the abdominal aorta and iliac vessels.  There is a focal somewhat increased density fluid collection deep to the incision over the left lower anterior abdominal wall measuring approximately 4 x 4.5 cm in its transverse and AP dimensions extending from the skin surface to the surface of the peritoneum. There is no intraperitoneal extension. There is a second smaller similar appearing increased density fluid collection over the slightly lower left anterior abdominal wall measuring 1.9 x 2.7 cm in its AP and transverse dimensions. These may represent infected versus noninfected postsurgical collections.  Pelvic images are otherwise unremarkable. There are  degenerative changes of the spine and hips.  IMPRESSION: Post left nephrectomy. Two somewhat high density fluid collections over the incision sites of the lower left anterior abdominal wall with the larger measuring 4 x 4.5 cm and the smaller slightly more inferiorly measuring 1.9 x 2.7 cm. No intraperitoneal extension of these fluid collections. These may represent infective versus noninfected postsurgical fluid collections.   Electronically Signed   By: Marin Olp M.D.   On: 06/23/2013 21:04     EKG Interpretation None      MDM   Final diagnoses:  Abdominal wall abscess at site of surgical wound   58yM with abdominal wall abscess. Pt reports bloody drainage, but what I'm seeing is primarily purulence.  No CT evidence of intraabdominal extension. Well appearing. Afebrile. No surrounding cellulitis. Discussed with Dr Alinda Money on call for urology who reviewed scan. Feels reasonable for discharge for follow-up in the office tomorrow. Wound was gently probed with further  drainage of a decent amount of purulent material. Culture sent. Script for abx.     Virgel Manifold, MD 06/28/13 [phone]

## 2013-06-23 NOTE — ED Notes (Signed)
Pt dc to home. sts understanding to dc instructions. Pt taken to exit by w/c.

## 2013-06-23 NOTE — Progress Notes (Signed)
ANTIBIOTIC CONSULT NOTE  Pharmacy Consult for vancomycin Indication: wound infection  No Known Allergies  Patient Measurements: Height: 6\' 3"  (190.5 cm) Weight: 220 lb (99.791 kg) IBW/kg (Calculated) : 84.5   Vital Signs: Temp: 98.9 F (37.2 C) (04/15 1818) Temp src: Oral (04/15 1818) BP: 141/91 mmHg (04/15 1818) Pulse Rate: 87 (04/15 1839)    Labs:  Recent Labs  06/23/13 1926  WBC 9.8  HGB 11.2*  PLT 230  CREATININE 3.88*    Medical History: Past Medical History  Diagnosis Date  . Hypertension   . Gout   . Hepatitis     30 years ago  . Arthritis     OA  . Chronic kidney disease     ACUTE RENAL FAILURE 12/2012 STATUS POST RT ARM AV FISTULA - HAS NOT STARTED DIALYSIS; LEFT RENAL MASS  . Pneumonia     HOSPITALIZED 12/2012 LEGIONLLA PNEUMONIA    Medications:  See EMR  Assessment: Pharmacy consulted to dose vancomycin for possible wound infection.  Pt is s/p L nephrectomy on 05/28/13.  Pt is afebrile.  Goal of Therapy:  Vancomycin trough level 10-15 mcg/ml  Plan:  Vancomycin 1500 mg IV x1 followed by vancomycin 1 g IV q12h VT as indicated F/u cultures F/u renal function   Hughes Better, PharmD, BCPS Clinical Pharmacist Pager: [phone] 06/23/2013 8:21 PM

## 2013-06-23 NOTE — Discharge Instructions (Signed)
Abscess An abscess is an infected area that contains a collection of pus and debris.It can occur in almost any part of the body. An abscess is also known as a furuncle or boil. CAUSES  An abscess occurs when tissue gets infected. This can occur from blockage of oil or sweat glands, infection of hair follicles, or a minor injury to the skin. As the body tries to fight the infection, pus collects in the area and creates pressure under the skin. This pressure causes pain. People with weakened immune systems have difficulty fighting infections and get certain abscesses more often.  SYMPTOMS Usually an abscess develops on the skin and becomes a painful mass that is red, warm, and tender. If the abscess forms under the skin, you may feel a moveable soft area under the skin. Some abscesses break open (rupture) on their own, but most will continue to get worse without care. The infection can spread deeper into the body and eventually into the bloodstream, causing you to feel ill.  DIAGNOSIS  Your caregiver will take your medical history and perform a physical exam. A sample of fluid may also be taken from the abscess to determine what is causing your infection. TREATMENT  Your caregiver may prescribe antibiotic medicines to fight the infection. However, taking antibiotics alone usually does not cure an abscess. Your caregiver may need to make a small cut (incision) in the abscess to drain the pus. In some cases, gauze is packed into the abscess to reduce pain and to continue draining the area. HOME CARE INSTRUCTIONS   Only take over-the-counter or prescription medicines for pain, discomfort, or fever as directed by your caregiver.  If you were prescribed antibiotics, take them as directed. Finish them even if you start to feel better.  If gauze is used, follow your caregiver's directions for changing the gauze.  To avoid spreading the infection:  Keep your draining abscess covered with a  bandage.  Wash your hands well.  Do not share personal care items, towels, or whirlpools with others.  Avoid skin contact with others.  Keep your skin and clothes clean around the abscess.  Keep all follow-up appointments as directed by your caregiver. SEEK MEDICAL CARE IF:   You have increased pain, swelling, redness, fluid drainage, or bleeding.  You have muscle aches, chills, or a general ill feeling.  You have a fever. MAKE SURE YOU:   Understand these instructions.  Will watch your condition.  Will get help right away if you are not doing well or get worse. Document Released: 12/05/2004 Document Revised: 08/27/2011 Document Reviewed: 05/10/2011 [employer] Patient Information 2014 [employer].  

## 2013-06-23 NOTE — ED Notes (Signed)
Pt reports L nephrectomy on 3/20.  Reports surgical site had started opening up 1 week ago and was re-evaluated and had gauze placed on surgical site.  Reports blood "gushing out" from surgical site today.

## 2013-06-24 ENCOUNTER — Encounter: Payer: Self-pay | Admitting: Vascular Surgery

## 2013-06-24 ENCOUNTER — Ambulatory Visit (HOSPITAL_COMMUNITY)
Admission: RE | Admit: 2013-06-24 | Discharge: 2013-06-24 | Disposition: A | Discharge status: Home / Self Care | Payer: Medicaid Other | Source: Ambulatory Visit | Attending: Vascular Surgery | Admitting: Vascular Surgery

## 2013-06-24 ENCOUNTER — Ambulatory Visit (INDEPENDENT_AMBULATORY_CARE_PROVIDER_SITE_OTHER): Payer: Medicaid Other | Admitting: Vascular Surgery

## 2013-06-24 VITALS — BP 131/81 | HR 83 | Ht 75.0 in | Wt 216.0 lb

## 2013-06-24 DIAGNOSIS — Z4931 Encounter for adequacy testing for hemodialysis: Secondary | ICD-10-CM

## 2013-06-24 DIAGNOSIS — N184 Chronic kidney disease, stage 4 (severe): Secondary | ICD-10-CM | POA: Insufficient documentation

## 2013-06-24 DIAGNOSIS — N186 End stage renal disease: Secondary | ICD-10-CM

## 2013-06-24 NOTE — Progress Notes (Signed)
Patient is a 58 year old male who returns for followup today after revision of his right basilic vein transposition fistula. He denies any numbness or tingling in his hand. His incisions are well-healed.  Physical exam:  Filed Vitals:   06/24/13 1427  BP: 131/81  Pulse: 83  Height: 6\' 3"  (1.905 m)  Weight: 216 lb (97.977 kg)  SpO2: 99%    Fistula has a palpable thrill in the antecubital area. The fistula is palpable throughout the upper arm.  Data: Duplex scan was performed today which shows the diameter of the fistulous 5-7 mm. It is 2-6 mm in depth from the skin.  Assessment: Patent AV fistula currently not in use  Plan: Fistula should be ready for use if required in the future. The patient will followup with me on as-needed basis.  Ruta Hinds, MD [facility] Office: [phone] Pager: [phone]

## 2013-06-27 LAB — WOUND CULTURE

## 2013-06-28 NOTE — ED Notes (Signed)
+   Wound Culture Treated per protocol MD.

## 2013-06-30 ENCOUNTER — Telehealth (HOSPITAL_BASED_OUTPATIENT_CLINIC_OR_DEPARTMENT_OTHER): Payer: Self-pay | Admitting: Emergency Medicine

## 2013-06-30 NOTE — Telephone Encounter (Signed)
Post ED Visit - Positive Culture Follow-up  Culture report reviewed by antimicrobial stewardship pharmacist: []  Wes Cheswold, Pharm.D., BCPS [x]  Heide Guile, Pharm.D., BCPS []  Alycia Rossetti, Pharm.D., BCPS []  Starr School, Pharm.D., BCPS, AAHIVP []  Legrand Como, Pharm.D., BCPS, AAHIVP []  Juliene Pina, Pharm.D.  Positive wound culture Treated with Keflex, organism sensitive to the same and no further patient follow-up is required at this time.  Brian Lawson 06/30/2013, 12:11 PM

## 2013-12-29 ENCOUNTER — Inpatient Hospital Stay (HOSPITAL_COMMUNITY)
Admission: EM | Admit: 2013-12-29 | Discharge: 2013-12-31 | DRG: 683 | Disposition: A | Discharge status: Home / Self Care | Payer: Medicaid Other | Attending: Internal Medicine | Admitting: Internal Medicine

## 2013-12-29 ENCOUNTER — Emergency Department (HOSPITAL_COMMUNITY): Payer: Medicaid Other

## 2013-12-29 ENCOUNTER — Encounter (HOSPITAL_COMMUNITY): Payer: Self-pay | Admitting: Emergency Medicine

## 2013-12-29 DIAGNOSIS — N186 End stage renal disease: Secondary | ICD-10-CM

## 2013-12-29 DIAGNOSIS — R110 Nausea: Secondary | ICD-10-CM | POA: Diagnosis present

## 2013-12-29 DIAGNOSIS — M109 Gout, unspecified: Secondary | ICD-10-CM | POA: Diagnosis present

## 2013-12-29 DIAGNOSIS — Z79899 Other long term (current) drug therapy: Secondary | ICD-10-CM

## 2013-12-29 DIAGNOSIS — N183 Chronic kidney disease, stage 3 unspecified: Secondary | ICD-10-CM

## 2013-12-29 DIAGNOSIS — R0602 Shortness of breath: Secondary | ICD-10-CM

## 2013-12-29 DIAGNOSIS — Z7952 Long term (current) use of systemic steroids: Secondary | ICD-10-CM

## 2013-12-29 DIAGNOSIS — N184 Chronic kidney disease, stage 4 (severe): Secondary | ICD-10-CM | POA: Diagnosis present

## 2013-12-29 DIAGNOSIS — I129 Hypertensive chronic kidney disease with stage 1 through stage 4 chronic kidney disease, or unspecified chronic kidney disease: Secondary | ICD-10-CM | POA: Diagnosis present

## 2013-12-29 DIAGNOSIS — Z905 Acquired absence of kidney: Secondary | ICD-10-CM | POA: Diagnosis present

## 2013-12-29 DIAGNOSIS — E871 Hypo-osmolality and hyponatremia: Secondary | ICD-10-CM | POA: Diagnosis present

## 2013-12-29 DIAGNOSIS — Z87891 Personal history of nicotine dependence: Secondary | ICD-10-CM | POA: Diagnosis not present

## 2013-12-29 DIAGNOSIS — Z8249 Family history of ischemic heart disease and other diseases of the circulatory system: Secondary | ICD-10-CM | POA: Diagnosis not present

## 2013-12-29 DIAGNOSIS — N179 Acute kidney failure, unspecified: Secondary | ICD-10-CM | POA: Diagnosis present

## 2013-12-29 DIAGNOSIS — Z85528 Personal history of other malignant neoplasm of kidney: Secondary | ICD-10-CM

## 2013-12-29 DIAGNOSIS — I10 Essential (primary) hypertension: Secondary | ICD-10-CM | POA: Diagnosis present

## 2013-12-29 DIAGNOSIS — N19 Unspecified kidney failure: Secondary | ICD-10-CM | POA: Diagnosis present

## 2013-12-29 LAB — I-STAT CHEM 8, ED
BUN: 86 mg/dL — AB (ref 6–23)
CALCIUM ION: 1.18 mmol/L (ref 1.12–1.23)
CREATININE: 5.9 mg/dL — AB (ref 0.50–1.35)
Chloride: 95 mEq/L — ABNORMAL LOW (ref 96–112)
Glucose, Bld: 129 mg/dL — ABNORMAL HIGH (ref 70–99)
HCT: 43 % (ref 39.0–52.0)
Hemoglobin: 14.6 g/dL (ref 13.0–17.0)
Potassium: 3.8 mEq/L (ref 3.7–5.3)
Sodium: 134 mEq/L — ABNORMAL LOW (ref 137–147)
TCO2: 29 mmol/L (ref 0–100)

## 2013-12-29 LAB — COMPREHENSIVE METABOLIC PANEL
ALT: 10 U/L (ref 0–53)
ANION GAP: 19 — AB (ref 5–15)
AST: 13 U/L (ref 0–37)
Albumin: 3.8 g/dL (ref 3.5–5.2)
Alkaline Phosphatase: 49 U/L (ref 39–117)
BUN: 84 mg/dL — AB (ref 6–23)
CO2: 25 meq/L (ref 19–32)
Calcium: 10.6 mg/dL — ABNORMAL HIGH (ref 8.4–10.5)
Chloride: 87 mEq/L — ABNORMAL LOW (ref 96–112)
Creatinine, Ser: 5.91 mg/dL — ABNORMAL HIGH (ref 0.50–1.35)
GFR calc non Af Amer: 9 mL/min — ABNORMAL LOW (ref >90)
GFR, EST AFRICAN AMERICAN: 11 mL/min — AB (ref >90)
GLUCOSE: 127 mg/dL — AB (ref 70–99)
POTASSIUM: 3.9 meq/L (ref 3.7–5.3)
SODIUM: 131 meq/L — AB (ref 137–147)
Total Bilirubin: 0.3 mg/dL (ref 0.3–1.2)
Total Protein: 8.4 g/dL — ABNORMAL HIGH (ref 6.0–8.3)

## 2013-12-29 LAB — I-STAT TROPONIN, ED: Troponin i, poc: 0.01 ng/mL (ref 0.00–0.08)

## 2013-12-29 LAB — CBC WITH DIFFERENTIAL/PLATELET
Basophils Absolute: 0 10*3/uL (ref 0.0–0.1)
Basophils Relative: 0 % (ref 0–1)
EOS ABS: 0 10*3/uL (ref 0.0–0.7)
Eosinophils Relative: 0 % (ref 0–5)
HCT: 38.5 % — ABNORMAL LOW (ref 39.0–52.0)
HEMOGLOBIN: 12.5 g/dL — AB (ref 13.0–17.0)
LYMPHS ABS: 2.3 10*3/uL (ref 0.7–4.0)
LYMPHS PCT: 26 % (ref 12–46)
MCH: 28 pg (ref 26.0–34.0)
MCHC: 32.5 g/dL (ref 30.0–36.0)
MCV: 86.3 fL (ref 78.0–100.0)
MONOS PCT: 6 % (ref 3–12)
Monocytes Absolute: 0.6 10*3/uL (ref 0.1–1.0)
Neutro Abs: 6 10*3/uL (ref 1.7–7.7)
Neutrophils Relative %: 68 % (ref 43–77)
PLATELETS: 277 10*3/uL (ref 150–400)
RBC: 4.46 MIL/uL (ref 4.22–5.81)
RDW: 15.7 % — ABNORMAL HIGH (ref 11.5–15.5)
WBC: 8.9 10*3/uL (ref 4.0–10.5)

## 2013-12-29 LAB — PRO B NATRIURETIC PEPTIDE: Pro B Natriuretic peptide (BNP): 90.8 pg/mL (ref 0–125)

## 2013-12-29 MED ORDER — ONDANSETRON HCL 4 MG/2ML IJ SOLN
4.0000 mg | Freq: Once | INTRAMUSCULAR | Status: AC
  Administered 2013-12-29: 4 mg via INTRAVENOUS
  Filled 2013-12-29: qty 2

## 2013-12-29 NOTE — ED Notes (Signed)
Pt states he is in stage 4 renal failure and has not started dialysis as of yet  Pt states he had his left kidney removed due to cancer  Pt states the cancer was contained to that area  Pt states he has been having nausea and some shortness of breath  Pt states he called his nephrologist and was told to come in to rule out uremia

## 2013-12-29 NOTE — ED Notes (Signed)
Patient transported to X-ray 

## 2013-12-29 NOTE — ED Provider Notes (Signed)
CSN: [id number]     Arrival date & time 12/29/13  [id number] History   First MD Initiated Contact with Patient 12/29/13 1946     Chief Complaint  Patient presents with  . Nausea  . Shortness of Breath     (Consider location/radiation/quality/duration/timing/severity/associated sxs/prior Treatment) The history is provided by the patient.  Voyle Tull is a 58 y.o. male hx of HTN, CKD, kidney cancer s/p nephrectomy here with worsening shortness of breath as well as nausea. He has been having progressively worsening nausea for the last 3 weeks. Also shortness of breath with minimal exertion. Denies any chest pain but just has some chest pressure. Has some chronic leg swelling that is not getting worse. Sent by nephrologist to evaluate for uremia.    Past Medical History  Diagnosis Date  . Hypertension   . Gout   . Hepatitis     30 years ago  . Arthritis     OA  . Chronic kidney disease     ACUTE RENAL FAILURE 12/2012 STATUS POST RT ARM AV FISTULA - HAS NOT STARTED DIALYSIS; LEFT RENAL MASS  . Pneumonia     HOSPITALIZED 12/2012 LEGIONLLA PNEUMONIA  . Cancer renal lt nephrectomy   Past Surgical History  Procedure Laterality Date  . Hernia repair    . Bascilic vein transposition Right 01/18/2013    Procedure: BASCILIC VEIN TRANSPOSITION;  Surgeon: Elam Dutch, MD;  Location: [place];  Service: Vascular;  Laterality: Right;  . Bascilic vein transposition Right 04/27/2013    Procedure: BASCILIC VEIN TRANSPOSITION AND REVISION;  Surgeon: Elam Dutch, MD;  Location: [facility];  Service: Vascular;  Laterality: Right;  . Laparoscopic nephrectomy Left 05/28/2013    Procedure: LEFT LAPAROSCOPIC  RADICAL NEPHRECTOMY;  Surgeon: Ardis Hughs, MD;  Location: [facility];  Service: Urology;  Laterality: Left;   Family History  Problem Relation Age of Onset  . Hypertension Mother    History  Substance Use Topics  . Smoking status: Former Research scientist (life sciences)  . Smokeless tobacco: Never Used  . Alcohol  Use: Yes     Comment: ocassionally  QUIT SMOKING IN 2014    Review of Systems  Respiratory: Positive for shortness of breath.   Gastrointestinal: Positive for nausea.  All other systems reviewed and are negative.     Allergies  Review of patient's allergies indicates no known allergies.  Home Medications   Prior to Admission medications   Medication Sig Start Date End Date Taking? Authorizing Provider  calcium acetate (PHOSLO) 667 MG capsule Take 1,334 mg by mouth 3 (three) times daily with meals. 01/25/13  Yes Adeline Saralyn Pilar, MD  cholecalciferol (VITAMIN D) 1000 UNITS tablet Take 1,000 Units by mouth daily.   Yes Historical Provider, MD  cloNIDine (CATAPRES) 0.2 MG tablet Take 0.2 mg by mouth 3 (three) times daily. 01/25/13  Yes Adeline Saralyn Pilar, MD  docusate sodium (COLACE) 100 MG capsule Take 100 mg by mouth 2 (two) times daily.   Yes Historical Provider, MD  furosemide (LASIX) 40 MG tablet Take 40 mg by mouth daily.   Yes Historical Provider, MD  hydrALAZINE (APRESOLINE) 25 MG tablet Take 25 mg by mouth 3 (three) times daily.   Yes Historical Provider, MD  hydrALAZINE (APRESOLINE) 50 MG tablet Take 50 mg by mouth 3 (three) times daily.   Yes Historical Provider, MD  isosorbide dinitrate (ISORDIL) 5 MG tablet Take 5 mg by mouth 2 (two) times daily. 01/25/13  Yes Sheila Oats, MD  omeprazole (PRILOSEC) 40 MG capsule Take 40 mg by mouth every morning.  01/25/13  Yes Adeline Saralyn Pilar, MD  predniSONE (DELTASONE) 10 MG tablet Take 10 mg by mouth daily with breakfast.   Yes Historical Provider, MD  sodium bicarbonate 650 MG tablet Take 650 mg by mouth 2 (two) times daily. 01/25/13  Yes Adeline C Viyuoh, MD   BP 105/73  Pulse 102  Temp(Src) 98.4 F (36.9 C) (Oral)  Resp 15  SpO2 97% Physical Exam  Nursing note and vitals reviewed. Constitutional: He is oriented to person, place, and time.  Chronically ill   HENT:  Head: Normocephalic.  Mouth/Throat: Oropharynx is clear  and moist.  Eyes: Conjunctivae and EOM are normal. Pupils are equal, round, and reactive to light.  Neck: Normal range of motion. Neck supple.  Cardiovascular: Normal rate, regular rhythm and normal heart sounds.   Pulmonary/Chest: Effort normal and breath sounds normal.  Diminished bilateral bases   Abdominal: Soft. Bowel sounds are normal. He exhibits no distension. There is no tenderness. There is no rebound and no guarding.  Musculoskeletal:  1+ edema bilaterally. R arm fistula with good thrill   Neurological: He is alert and oriented to person, place, and time. No cranial nerve deficit. Coordination normal.  Skin: Skin is warm and dry.  Psychiatric: He has a normal mood and affect. His behavior is normal. Judgment and thought content normal.    ED Course  Procedures (including critical care time) Labs Review Labs Reviewed  CBC WITH DIFFERENTIAL - Abnormal; Notable for the following:    Hemoglobin 12.5 (*)    HCT 38.5 (*)    RDW 15.7 (*)    All other components within normal limits  COMPREHENSIVE METABOLIC PANEL - Abnormal; Notable for the following:    Sodium 131 (*)    Chloride 87 (*)    Glucose, Bld 127 (*)    BUN 84 (*)    Creatinine, Ser 5.91 (*)    Calcium 10.6 (*)    Total Protein 8.4 (*)    GFR calc non Af Amer 9 (*)    GFR calc Af Amer 11 (*)    Anion gap 19 (*)    All other components within normal limits  I-STAT CHEM 8, ED - Abnormal; Notable for the following:    Sodium 134 (*)    Chloride 95 (*)    BUN 86 (*)    Creatinine, Ser 5.90 (*)    Glucose, Bld 129 (*)    All other components within normal limits  PRO B NATRIURETIC PEPTIDE  I-STAT TROPOININ, ED    Imaging Review Dg Chest 2 View  12/29/2013   CLINICAL DATA:  58 year old male with acute shortness of breath and nausea. Current history of stage IV renal failure not yet on dialysis. Initial encounter. Personal history of left renal cell carcinoma status post nephrectomy.  EXAM: CHEST  2 VIEW   COMPARISON:  Chest CT 09/13/2013 and earlier.  FINDINGS: Upright AP and lateral views of the chest. Stable lung volumes. Stable cardiac size and mediastinal contours. Visualized tracheal air column is within normal limits. No pneumothorax or pulmonary edema. No pleural effusion or confluent pulmonary opacity. No acute osseous abnormality identified.  IMPRESSION: No acute or metastatic cardiopulmonary abnormality.   Electronically Signed   By: Lars Pinks M.D.   On: 12/29/2013 20:26     EKG Interpretation   Date/Time:  Wednesday December 29 2013 19:10:46 EDT Ventricular Rate:  102 PR Interval:  150 QRS  Duration: 104 QT Interval:  352 QTC Calculation: 458 R Axis:   35 Text Interpretation:  Sinus tachycardia Nonspecific ST and T wave  abnormality Abnormal ECG Since last tracing rate faster Confirmed by YAO   MD, DAVID ([id number]) on 12/29/2013 7:47:05 PM      MDM   Final diagnoses:  None   Orvan Wilmott is a 58 y.o. male here with SOB, weakness, nausea. EKG showed no obvious peaked T waves. Will check BMP to r/o hyperkalemia or uremia. Will get trop and CXR to r/o ACS.   9:31 PM Cr now 5.9. K nl. CXR showed no pulmonary edema. Called Dr. Justin Mend, who recommend admission with transfer to [facility] for dialysis tomorrow. I updated patient.     Wandra Arthurs, MD 12/29/13 2142

## 2013-12-29 NOTE — H&P (Signed)
PCP:  Criselda Peaches, MD   [facility]   Chief Complaint:  Not feeling well  HPI: Brian Lawson is a 58 y.o. male   has a past medical history of Hypertension; Gout; Hepatitis; Arthritis; Chronic kidney disease; Pneumonia; and Cancer (renal lt nephrectomy).   Presented with  Patient states he has not been feeling well: nausea and fatigue for the past 2 weeks. Patient is sp left nephrectomy for hx of renal Mass in 05/2013  His kidney function have deteriorated since. He is sp fistula placement in February 2015.  Denies any vomiting, no chest pain, no  Fever, patient have been on Lasix and that has controlled his edema he does endorse some shortness of breath. He reports good urine out put. Patient called his nephrology office reporting symptoms of nausea and fatigue. He was sent to emerge department to check his electrolytes. He was found to have acute on chronic worsening renal function revealed creatinine up to 5.9 from a baseline of 3.8 since April.  ER spoke to Dr. [employer] nephrology who recommended admission to [facility] for possible initiation of hemodialysis  Hospitalist was called for admission for Uremia  Review of Systems:    Pertinent positives include: fatigue, nausea, shortness of breath at rest.   dyspnea on exertion, Constitutional:  No weight loss, night sweats, Fevers, chills,  weight loss  HEENT:  No headaches, Difficulty swallowing,Tooth/dental problems,Sore throat,  No sneezing, itching, ear ache, nasal congestion, post nasal drip,  Cardio-vascular:  No chest pain, Orthopnea, PND, anasarca, dizziness, palpitations.no Bilateral lower extremity swelling  GI:  No heartburn, indigestion, abdominal pain, vomiting, diarrhea, change in bowel habits, loss of appetite, melena, blood in stool, hematemesis Resp:  no  No excess mucus, no productive cough, No non-productive cough, No coughing up of blood.No change in color of mucus.No wheezing. Skin:  no  rash or lesions. No jaundice GU:  no dysuria, change in color of urine, no urgency or frequency. No straining to urinate.  No flank pain.  Musculoskeletal:  No joint pain or no joint swelling. No decreased range of motion. No back pain.  Psych:  No change in mood or affect. No depression or anxiety. No memory loss.  Neuro: no localizing neurological complaints, no tingling, no weakness, no double vision, no gait abnormality, no slurred speech, no confusion  Otherwise ROS are negative except for above, 10 systems were reviewed  Past Medical History: Past Medical History  Diagnosis Date  . Hypertension   . Gout   . Hepatitis     30 years ago  . Arthritis     OA  . Chronic kidney disease     ACUTE RENAL FAILURE 12/2012 STATUS POST RT ARM AV FISTULA - HAS NOT STARTED DIALYSIS; LEFT RENAL MASS  . Pneumonia     HOSPITALIZED 12/2012 LEGIONLLA PNEUMONIA  . Cancer renal lt nephrectomy   Past Surgical History  Procedure Laterality Date  . Hernia repair    . Bascilic vein transposition Right 01/18/2013    Procedure: BASCILIC VEIN TRANSPOSITION;  Surgeon: Elam Dutch, MD;  Location: [place];  Service: Vascular;  Laterality: Right;  . Bascilic vein transposition Right 04/27/2013    Procedure: BASCILIC VEIN TRANSPOSITION AND REVISION;  Surgeon: Elam Dutch, MD;  Location: [place];  Service: Vascular;  Laterality: Right;  . Laparoscopic nephrectomy Left 05/28/2013    Procedure: LEFT LAPAROSCOPIC  RADICAL NEPHRECTOMY;  Surgeon: Ardis Hughs, MD;  Location: [facility];  Service: Urology;  Laterality: Left;     Medications: Prior to Admission medications   Medication Sig Start Date End Date Taking? Authorizing Provider  calcium acetate (PHOSLO) 667 MG capsule Take 1,334 mg by mouth 3 (three) times daily with meals. 01/25/13  Yes Adeline Saralyn Pilar, MD  cholecalciferol (VITAMIN D) 1000 UNITS tablet Take 1,000 Units by mouth daily.   Yes Historical Provider, MD  cloNIDine (CATAPRES) 0.2  MG tablet Take 0.2 mg by mouth 3 (three) times daily. 01/25/13  Yes Adeline Saralyn Pilar, MD  docusate sodium (COLACE) 100 MG capsule Take 100 mg by mouth 2 (two) times daily.   Yes Historical Provider, MD  furosemide (LASIX) 40 MG tablet Take 40 mg by mouth daily.   Yes Historical Provider, MD  hydrALAZINE (APRESOLINE) 25 MG tablet Take 25 mg by mouth 3 (three) times daily.   Yes Historical Provider, MD  hydrALAZINE (APRESOLINE) 50 MG tablet Take 50 mg by mouth 3 (three) times daily.   Yes Historical Provider, MD  isosorbide dinitrate (ISORDIL) 5 MG tablet Take 5 mg by mouth 2 (two) times daily. 01/25/13  Yes Adeline Saralyn Pilar, MD  omeprazole (PRILOSEC) 40 MG capsule Take 40 mg by mouth every morning.  01/25/13  Yes Adeline Saralyn Pilar, MD  predniSONE (DELTASONE) 10 MG tablet Take 10 mg by mouth daily with breakfast.   Yes Historical Provider, MD  sodium bicarbonate 650 MG tablet Take 650 mg by mouth 2 (two) times daily. 01/25/13  Yes Sheila Oats, MD    Allergies:  No Known Allergies  Social History:  Ambulatory   independently   Lives at home alone     reports that he has quit smoking. He has never used smokeless tobacco. He reports that he drinks alcohol. He reports that he does not use illicit drugs.    Family History: family history includes Cancer in his father; Hypertension in his mother and sister.    Physical Exam: Patient Vitals for the past 24 hrs:  BP Temp Temp src Pulse Resp SpO2  12/29/13 2124 105/73 mmHg - - - 15 97 %  12/29/13 2014 117/79 mmHg - - - 14 96 %  12/29/13 1908 116/79 mmHg 98.4 F (36.9 C) Oral 102 20 99 %    1. General:  in No Acute distress 2. Psychological: Alert and Oriented 3. Head/ENT:   Moist  Mucous Membranes                          Head Non traumatic, neck supple                          Normal Dentition 4. SKIN: normal Skin turgor,  Skin clean Dry and intact no rash 5. Heart: Regular rate and rhythm no Murmur, Rub or gallop 6. Lungs: Clear  to auscultation bilaterally, no wheezes or crackles   7. Abdomen: Soft, non-tender, Non distended 8. Lower extremities: no clubbing, cyanosis, or edema 9. Neurologically Grossly intact, moving all 4 extremities equally 10. MSK: Normal range of motion  body mass index is unknown because there is no weight on file.   Labs on Admission:   Recent Labs  12/29/13 2033 12/29/13 2057  NA 131* 134*  K 3.9 3.8  CL 87* 95*  CO2 25  --   GLUCOSE 127* 129*  BUN 84* 86*  CREATININE 5.91* 5.90*  CALCIUM 10.6*  --     Recent Labs  12/29/13  2033  AST 13  ALT 10  ALKPHOS 49  BILITOT 0.3  PROT 8.4*  ALBUMIN 3.8   No results found for this basename: LIPASE, AMYLASE,  in the last 72 hours  Recent Labs  12/29/13 2033 12/29/13 2057  WBC 8.9  --   NEUTROABS 6.0  --   HGB 12.5* 14.6  HCT 38.5* 43.0  MCV 86.3  --   PLT 277  --    No results found for this basename: CKTOTAL, CKMB, CKMBINDEX, TROPONINI,  in the last 72 hours No results found for this basename: TSH, T4TOTAL, FREET3, T3FREE, THYROIDAB,  in the last 72 hours No results found for this basename: VITAMINB12, FOLATE, FERRITIN, TIBC, IRON, RETICCTPCT,  in the last 72 hours No results found for this basename: HGBA1C    The CrCl is unknown because both a height and weight (above a minimum accepted value) are required for this calculation. ABG    Component Value Date/Time   HCO3 30.9* 01/13/2007 1653   TCO2 29 12/29/2013 2057     No results found for this basename: DDIMER     Other results:  I have pearsonaly reviewed this: ECG REPORT  Rate: 102  Rhythm: ST ST&T Change: no ischemic changes   BNP (last 3 results)  Recent Labs  12/29/13 2033  PROBNP 90.8    There were no vitals filed for this visit.   Cultures:    Component Value Date/Time   SDES WOUND LEFT ABDOMEN 06/23/2013 2203   SPECREQUEST NONE 06/23/2013 2203   CULT  Value: ABUNDANT ESCHERICHIA COLI Performed at [facility] 06/23/2013 2203     REPTSTATUS 06/27/2013 FINAL 06/23/2013 2203        Radiological Exams on Admission: Dg Chest 2 View  12/29/2013   CLINICAL DATA:  58 year old male with acute shortness of breath and nausea. Current history of stage IV renal failure not yet on dialysis. Initial encounter. Personal history of left renal cell carcinoma status post nephrectomy.  EXAM: CHEST  2 VIEW  COMPARISON:  Chest CT 09/13/2013 and earlier.  FINDINGS: Upright AP and lateral views of the chest. Stable lung volumes. Stable cardiac size and mediastinal contours. Visualized tracheal air column is within normal limits. No pneumothorax or pulmonary edema. No pleural effusion or confluent pulmonary opacity. No acute osseous abnormality identified.  IMPRESSION: No acute or metastatic cardiopulmonary abnormality.   Electronically Signed   By: Lars Pinks M.D.   On: 12/29/2013 20:26    Chart has been reviewed  Assessment/Plan 58 year old gentleman with history of left nephrectomy secondary to renal cell carcinoma. With acute on chronic renal failure with evidence of uremia. Been admitted for acute on chronic renal failure.  Present on Admission:  . Uremia - admit to telemetry to [facility].  nephrology consult to be called on arrival. Given somewhat low blood pressure we'll check orthostatics. It is possible worsening renal function is in the setting of fluid depletion. Hold Lasix for now patient had had decreased by mouth intake no evidence of fluid overload.  . CKD (chronic kidney disease) stage 3, GFR 30-59 ml/min - as per nephrology. We'll obtain renal labs in the morning. Continue to follow creatinine. Continue to avoid renal toxic medications.  . Essential hypertension - currently somewhat soft blood pressures. We'll hold clonidine and hydralazine for now restart as needed  . Renal mass status post resection followed by urology  . Hyponatremia - combination of chronic kidney disease chronic steroid use the patient possibly being  somewhat  limited right side.   Prophylaxis:  Lovenox, Protonix  CODE STATUS:  FULL CODE   Other plan as per orders.  I have spent a total of 55 min on this admission  Abad Manard 12/29/2013, 10:08 PM  [facility]  Pager [phone]   after 2 AM please page floor coverage PA If 7AM-7PM, please contact the day team taking care of the patient  [email or web address]  Password [email or web address]

## 2013-12-29 NOTE — ED Notes (Addendum)
Patient reports nausea that "has been going on for a while now and has gotten progressively worse". Patient denies vomiting, diarrhea, hematuria. Reports lightheadedness when moving. Denies pain at this time.

## 2013-12-30 DIAGNOSIS — N183 Chronic kidney disease, stage 3 (moderate): Secondary | ICD-10-CM

## 2013-12-30 DIAGNOSIS — N19 Unspecified kidney failure: Secondary | ICD-10-CM

## 2013-12-30 DIAGNOSIS — N179 Acute kidney failure, unspecified: Principal | ICD-10-CM

## 2013-12-30 LAB — CBC
HCT: 37.1 % — ABNORMAL LOW (ref 39.0–52.0)
Hemoglobin: 11.9 g/dL — ABNORMAL LOW (ref 13.0–17.0)
MCH: 27.7 pg (ref 26.0–34.0)
MCHC: 32.1 g/dL (ref 30.0–36.0)
MCV: 86.3 fL (ref 78.0–100.0)
PLATELETS: 281 10*3/uL (ref 150–400)
RBC: 4.3 MIL/uL (ref 4.22–5.81)
RDW: 16 % — ABNORMAL HIGH (ref 11.5–15.5)
WBC: 8.4 10*3/uL (ref 4.0–10.5)

## 2013-12-30 LAB — URINALYSIS, ROUTINE W REFLEX MICROSCOPIC
Bilirubin Urine: NEGATIVE
Glucose, UA: NEGATIVE mg/dL
Hgb urine dipstick: NEGATIVE
KETONES UR: NEGATIVE mg/dL
LEUKOCYTES UA: NEGATIVE
NITRITE: NEGATIVE
Protein, ur: NEGATIVE mg/dL
SPECIFIC GRAVITY, URINE: 1.015 (ref 1.005–1.030)
Urobilinogen, UA: 0.2 mg/dL (ref 0.0–1.0)
pH: 6.5 (ref 5.0–8.0)

## 2013-12-30 LAB — BASIC METABOLIC PANEL
Anion gap: 16 — ABNORMAL HIGH (ref 5–15)
BUN: 83 mg/dL — AB (ref 6–23)
CO2: 28 meq/L (ref 19–32)
CREATININE: 5.69 mg/dL — AB (ref 0.50–1.35)
Calcium: 10.3 mg/dL (ref 8.4–10.5)
Chloride: 91 mEq/L — ABNORMAL LOW (ref 96–112)
GFR calc Af Amer: 11 mL/min — ABNORMAL LOW (ref >90)
GFR, EST NON AFRICAN AMERICAN: 10 mL/min — AB (ref >90)
Glucose, Bld: 141 mg/dL — ABNORMAL HIGH (ref 70–99)
Potassium: 4.5 mEq/L (ref 3.7–5.3)
Sodium: 135 mEq/L — ABNORMAL LOW (ref 137–147)

## 2013-12-30 LAB — COMPREHENSIVE METABOLIC PANEL
ALT: 9 U/L (ref 0–53)
AST: 14 U/L (ref 0–37)
Albumin: 3.5 g/dL (ref 3.5–5.2)
Alkaline Phosphatase: 45 U/L (ref 39–117)
Anion gap: 19 — ABNORMAL HIGH (ref 5–15)
BUN: 84 mg/dL — AB (ref 6–23)
CALCIUM: 10.1 mg/dL (ref 8.4–10.5)
CO2: 23 mEq/L (ref 19–32)
CREATININE: 5.98 mg/dL — AB (ref 0.50–1.35)
Chloride: 93 mEq/L — ABNORMAL LOW (ref 96–112)
GFR, EST AFRICAN AMERICAN: 11 mL/min — AB (ref >90)
GFR, EST NON AFRICAN AMERICAN: 9 mL/min — AB (ref >90)
GLUCOSE: 178 mg/dL — AB (ref 70–99)
Potassium: 5.2 mEq/L (ref 3.7–5.3)
Sodium: 135 mEq/L — ABNORMAL LOW (ref 137–147)
Total Bilirubin: 0.2 mg/dL — ABNORMAL LOW (ref 0.3–1.2)
Total Protein: 8.2 g/dL (ref 6.0–8.3)

## 2013-12-30 LAB — MAGNESIUM: Magnesium: 2.4 mg/dL (ref 1.5–2.5)

## 2013-12-30 LAB — SODIUM, URINE, RANDOM: SODIUM UR: 47 meq/L

## 2013-12-30 LAB — CREATININE, URINE, RANDOM: Creatinine, Urine: 152.08 mg/dL

## 2013-12-30 LAB — TSH: TSH: 0.658 u[IU]/mL (ref 0.350–4.500)

## 2013-12-30 LAB — PHOSPHORUS: Phosphorus: 3.8 mg/dL (ref 2.3–4.6)

## 2013-12-30 LAB — OSMOLALITY, URINE: OSMOLALITY UR: 429 mosm/kg (ref 390–1090)

## 2013-12-30 MED ORDER — CALCIUM ACETATE 667 MG PO CAPS
1334.0000 mg | ORAL_CAPSULE | Freq: Three times a day (TID) | ORAL | Status: DC
  Administered 2013-12-30 (×2): 1334 mg via ORAL
  Filled 2013-12-30 (×4): qty 2

## 2013-12-30 MED ORDER — HYDROCODONE-ACETAMINOPHEN 5-325 MG PO TABS: 1.0000 | ORAL_TABLET | ORAL | Status: DC | PRN

## 2013-12-30 MED ORDER — CLONIDINE HCL 0.2 MG PO TABS
0.2000 mg | ORAL_TABLET | Freq: Three times a day (TID) | ORAL | Status: DC
  Administered 2013-12-30 – 2013-12-31 (×5): 0.2 mg via ORAL
  Filled 2013-12-30 (×7): qty 1

## 2013-12-30 MED ORDER — SODIUM CHLORIDE 0.9 % IV SOLN
INTRAVENOUS | Status: DC
  Administered 2013-12-30 – 2013-12-31 (×2): via INTRAVENOUS

## 2013-12-30 MED ORDER — PANTOPRAZOLE SODIUM 40 MG PO TBEC
40.0000 mg | DELAYED_RELEASE_TABLET | Freq: Every day | ORAL | Status: DC
  Administered 2013-12-30: 40 mg via ORAL
  Filled 2013-12-30: qty 1

## 2013-12-30 MED ORDER — DOCUSATE SODIUM 100 MG PO CAPS
100.0000 mg | ORAL_CAPSULE | Freq: Two times a day (BID) | ORAL | Status: DC
  Administered 2013-12-30 – 2013-12-31 (×3): 100 mg via ORAL
  Filled 2013-12-30 (×5): qty 1

## 2013-12-30 MED ORDER — SODIUM CHLORIDE 0.9 % IJ SOLN
3.0000 mL | Freq: Two times a day (BID) | INTRAMUSCULAR | Status: DC
  Administered 2013-12-30 – 2013-12-31 (×2): 3 mL via INTRAVENOUS

## 2013-12-30 MED ORDER — SODIUM POLYSTYRENE SULFONATE 15 GM/60ML PO SUSP
15.0000 g | Freq: Once | ORAL | Status: AC
Start: 2013-12-30 — End: 2013-12-30
  Administered 2013-12-30: 15 g via ORAL
  Filled 2013-12-30: qty 60

## 2013-12-30 MED ORDER — ONDANSETRON HCL 4 MG/2ML IJ SOLN: 4.0000 mg | Freq: Four times a day (QID) | INTRAMUSCULAR | Status: DC | PRN

## 2013-12-30 MED ORDER — ENOXAPARIN SODIUM 30 MG/0.3ML ~~LOC~~ SOLN
30.0000 mg | Freq: Every day | SUBCUTANEOUS | Status: DC
  Administered 2013-12-30 – 2013-12-31 (×2): 30 mg via SUBCUTANEOUS
  Filled 2013-12-30 (×2): qty 0.3

## 2013-12-30 MED ORDER — ONDANSETRON HCL 4 MG PO TABS: 4.0000 mg | ORAL_TABLET | Freq: Four times a day (QID) | ORAL | Status: DC | PRN

## 2013-12-30 MED ORDER — PREDNISONE 10 MG PO TABS
10.0000 mg | ORAL_TABLET | Freq: Every day | ORAL | Status: DC
  Administered 2013-12-30 – 2013-12-31 (×2): 10 mg via ORAL
  Filled 2013-12-30 (×3): qty 1

## 2013-12-30 MED ORDER — CALCIUM ACETATE 667 MG PO CAPS
667.0000 mg | ORAL_CAPSULE | Freq: Three times a day (TID) | ORAL | Status: DC
  Administered 2013-12-30 – 2013-12-31 (×4): 667 mg via ORAL
  Filled 2013-12-30 (×5): qty 1

## 2013-12-30 MED ORDER — SODIUM BICARBONATE 650 MG PO TABS
650.0000 mg | ORAL_TABLET | Freq: Two times a day (BID) | ORAL | Status: DC
  Administered 2013-12-30 – 2013-12-31 (×4): 650 mg via ORAL
  Filled 2013-12-30 (×5): qty 1

## 2013-12-30 MED ORDER — ACETAMINOPHEN 325 MG PO TABS: 650.0000 mg | ORAL_TABLET | Freq: Four times a day (QID) | ORAL | Status: DC | PRN

## 2013-12-30 MED ORDER — ACETAMINOPHEN 650 MG RE SUPP: 650.0000 mg | Freq: Four times a day (QID) | RECTAL | Status: DC | PRN

## 2013-12-30 MED ORDER — SODIUM CHLORIDE 0.9 % IV SOLN
250.0000 mL | INTRAVENOUS | Status: DC | PRN
Start: 2013-12-30 — End: 2013-12-31

## 2013-12-30 MED ORDER — SODIUM CHLORIDE 0.9 % IJ SOLN: 3.0000 mL | INTRAMUSCULAR | Status: DC | PRN

## 2013-12-30 NOTE — Progress Notes (Signed)
UR complete.  Eleonor Ocon RN, MSN 

## 2013-12-30 NOTE — Plan of Care (Signed)
Dr. Jason Nest answering service informed that pt arrived to [facility] as per his wishes. Answering service to page Dr. Justin Mend.

## 2013-12-30 NOTE — Consult Note (Signed)
Brian Lawson is an 58 y.o. 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  No Vomiting.  Renal fx in 8/15 showed SCr 3.5, 12/02/13 Scr was 4.4 and now 5.9.  Admits PO intake has been poor.  No change in UO.  Was put on lasix for edema since his August appt for some peripheral edema  Past Medical History  Diagnosis Date  . Hypertension   . Gout   . Hepatitis     30 years ago  . Arthritis     OA  . Chronic kidney disease     ACUTE RENAL FAILURE 12/2012 STATUS POST RT ARM AV FISTULA - HAS NOT STARTED DIALYSIS; LEFT RENAL MASS  . Pneumonia     HOSPITALIZED 12/2012 LEGIONLLA PNEUMONIA  . Cancer renal lt nephrectomy    Past Surgical History  Procedure Laterality Date  . Hernia repair    . Bascilic vein transposition Right 01/18/2013    Procedure: BASCILIC VEIN TRANSPOSITION;  Surgeon: Elam Dutch, MD;  Location: [facility];  Service: Vascular;  Laterality: Right;  . Bascilic vein transposition Right 04/27/2013    Procedure: BASCILIC VEIN TRANSPOSITION AND REVISION;  Surgeon: Elam Dutch, MD;  Location: [facility];  Service: Vascular;  Laterality: Right;  . Laparoscopic nephrectomy Left 05/28/2013    Procedure: LEFT LAPAROSCOPIC  RADICAL NEPHRECTOMY;  Surgeon: Ardis Hughs, MD;  Location: [facility];  Service: Urology;  Laterality: Left;    Family History  Problem Relation Age of Onset  . Hypertension Mother   . Cancer Father   . Hypertension Sister   No FH renal Ds  Social History:  reports that he has quit smoking. He has never used smokeless tobacco. He reports that he drinks alcohol. He reports that he does not use illicit drugs. Currently going to school  Allergies: No Known Allergies  Medications Prior to Admission  Medication Sig Dispense Refill  . calcium acetate (PHOSLO) 667 MG capsule Take 1,334 mg by mouth 3 (three) times daily  with meals.      . cholecalciferol (VITAMIN D) 1000 UNITS tablet Take 1,000 Units by mouth daily.      . cloNIDine (CATAPRES) 0.2 MG tablet Take 0.2 mg by mouth 3 (three) times daily.      Marland Kitchen docusate sodium (COLACE) 100 MG capsule Take 100 mg by mouth 2 (two) times daily.      . furosemide (LASIX) 40 MG tablet Take 40 mg by mouth daily.      . hydrALAZINE (APRESOLINE) 25 MG tablet Take 25 mg by mouth 3 (three) times daily.      . hydrALAZINE (APRESOLINE) 50 MG tablet Take 50 mg by mouth 3 (three) times daily.      . isosorbide dinitrate (ISORDIL) 5 MG tablet Take 5 mg by mouth 2 (two) times daily.      Marland Kitchen omeprazole (PRILOSEC) 40 MG capsule Take 40 mg by mouth every morning.       . predniSONE (DELTASONE) 10 MG tablet Take 10 mg by mouth daily with breakfast.      . sodium bicarbonate 650 MG tablet Take 650 mg by mouth 2 (two) times daily.         Lab Results: UA: benign   Recent Labs  12/29/13 2033 12/29/13 2057 12/30/13 0606  WBC 8.9  --  8.4  HGB 12.5* 14.6  11.9*  HCT 38.5* 43.0 37.1*  PLT 277  --  281   BMET  Recent Labs  12/29/13 2033 12/29/13 2057 12/30/13 0606  NA 131* 134* 135*  K 3.9 3.8 5.2  CL 87* 95* 93*  CO2 25  --  23  GLUCOSE 127* 129* 178*  BUN 84* 86* 84*  CREATININE 5.91* 5.90* 5.98*  CALCIUM 10.6*  --  10.1  PHOS  --   --  3.8   LFT  Recent Labs  12/30/13 0606  PROT 8.2  ALBUMIN 3.5  AST 14  ALT 9  ALKPHOS 45  BILITOT 0.2*   Dg Chest 2 View  12/29/2013   CLINICAL DATA:  58 year old male with acute shortness of breath and nausea. Current history of stage IV renal failure not yet on dialysis. Initial encounter. Personal history of left renal cell carcinoma status post nephrectomy.  EXAM: CHEST  2 VIEW  COMPARISON:  Chest CT 09/13/2013 and earlier.  FINDINGS: Upright AP and lateral views of the chest. Stable lung volumes. Stable cardiac size and mediastinal contours. Visualized tracheal air column is within normal limits. No pneumothorax or  pulmonary edema. No pleural effusion or confluent pulmonary opacity. No acute osseous abnormality identified.  IMPRESSION: No acute or metastatic cardiopulmonary abnormality.   Electronically Signed   By: Lars Pinks M.D.   On: 12/29/2013 20:26    ROS: No change in vision No CP No SOB No Abd pain No edema No neuropathic Sxs On prednisone for gout?  PHYSICAL EXAM: Blood pressure 125/87, pulse 89, temperature 98.5 F (36.9 C), temperature source Oral, resp. rate 18, height 6\' 3"  (D34-534 m), weight 98.204 kg (216 lb 8 oz), SpO2 100.00%. HEENT: PERRLA EOMI NECK:No JVD LUNGS:clear CARDIAC:RRR wo MRG ABD:+ BS NTND No HSM EXT:no edema  RUA AVF + bruit NEURO:CNI  M&SI No asterixis  Assessment: 1. Suspect he is at ESRD but he is reluctant to start HD just yet.  "The timing isn't right" as he is trying to attend school.  ? If there could be an acute component but likelihood low. 2. HTN 3. Hx RCC  SP Lt nephrectomy 4. Mild anemia not requiring ESA 5. Mild hypercalcemia PLAN: 1. He is agreeable to a trial of IV fluids overnight and if no improvement then he might be willing to start HD 2. Check Hep B 3. Check PTH 4. Daily Scr 5. Decrease Ca Acetate due to sl increased Ca and Nl PO4   Brian Lawson T 12/30/2013, 2:16 PM

## 2013-12-30 NOTE — Progress Notes (Signed)
[facility] Progress Note   Brian Lawson [id number] DOB: 1955-08-07 DOA: 12/29/2013 PCP: Criselda Peaches, MD  Brief narrative: Brian Lawson is a 58 y.o. male with past medical history of hypertension, gout, CKD status post left renal nephrectomy for renal cell cancer. The patient was advised by his nephrologist to come to the hospital and is presenting with worsening BUN and creatinine, nausea weight loss and fatigue.   Subjective: Continues to have a poor appetite. Has lost 20 pounds in the past few weeks. He is mildly nauseated.  Assessment/Plan: Principal Problem:   AKI -Uremia with underlying CKD stage 3, GFR 30-59 ml/min -Admitted for further management-awaiting renal consult to see if the patient needs to be considered for dialysis at this point -Continue sodium bicarbonate -Added a small dose of Kayexalate to prevent further increase in potassium  Active Problems:   Essential hypertension  Continue clonidine    Renal cell cancer Status post nephrectomy  Gout - Takes prednisone chronically for this    Code Status: Full code Family Communication: None Disposition Plan: To be determined DVT prophylaxis: Lovenox  Consultants: Nephrology  Procedures: None  Antibiotics: Anti-infectives   None       Objective: Filed Weights   12/30/13 0135  Weight: 98.204 kg (216 lb 8 oz)    Intake/Output Summary (Last 24 hours) at 12/30/13 1342 Last data filed at 12/30/13 1300  Gross per 24 hour  Intake    940 ml  Output    500 ml  Net    440 ml     Vitals Filed Vitals:   12/30/13 0135 12/30/13 0603 12/30/13 0954 12/30/13 1330  BP:  131/81 115/83 125/87  Pulse:  91 90 89  Temp: 98.1 F (36.7 C) 98 F (36.7 C) 98.2 F (36.8 C) 98.5 F (36.9 C)  TempSrc:  Oral Oral Oral  Resp: 16 18 20 18   Height: 6\' 3"  (1.905 m)     Weight: 98.204 kg (216 lb 8 oz)     SpO2: 99% 99% 98% 100%    Exam: General: No acute respiratory distress Lungs: Clear to  auscultation bilaterally without wheezes or crackles Cardiovascular: Regular rate and rhythm without murmur gallop or rub normal S1 and S2 Abdomen: Nontender, nondistended, soft, bowel sounds positive, no rebound, no ascites, no appreciable mass Extremities: No significant cyanosis, clubbing, or edema bilateral lower extremities  Data Reviewed: Basic Metabolic Panel:  Recent Labs Lab 12/29/13 2033 12/29/13 2057 12/30/13 0606  NA 131* 134* 135*  K 3.9 3.8 5.2  CL 87* 95* 93*  CO2 25  --  23  GLUCOSE 127* 129* 178*  BUN 84* 86* 84*  CREATININE 5.91* 5.90* 5.98*  CALCIUM 10.6*  --  10.1  MG  --   --  2.4  PHOS  --   --  3.8   Liver Function Tests:  Recent Labs Lab 12/29/13 2033 12/30/13 0606  AST 13 14  ALT 10 9  ALKPHOS 49 45  BILITOT 0.3 0.2*  PROT 8.4* 8.2  ALBUMIN 3.8 3.5   No results found for this basename: LIPASE, AMYLASE,  in the last 168 hours No results found for this basename: AMMONIA,  in the last 168 hours CBC:  Recent Labs Lab 12/29/13 2033 12/29/13 2057 12/30/13 0606  WBC 8.9  --  8.4  NEUTROABS 6.0  --   --   HGB 12.5* 14.6 11.9*  HCT 38.5* 43.0 37.1*  MCV 86.3  --  86.3  PLT 277  --  281   Cardiac Enzymes: No results found for this basename: CKTOTAL, CKMB, CKMBINDEX, TROPONINI,  in the last 168 hours BNP (last 3 results)  Recent Labs  12/29/13 2033  PROBNP 90.8   CBG: No results found for this basename: GLUCAP,  in the last 168 hours  No results found for this or any previous visit (from the past 240 hour(s)).   Studies:  Recent x-ray studies have been reviewed in detail by the Attending Physician  Scheduled Meds:  Scheduled Meds: . calcium acetate  1,334 mg Oral TID WC  . cloNIDine  0.2 mg Oral 3 times per day  . docusate sodium  100 mg Oral BID  . enoxaparin (LOVENOX) injection  30 mg Subcutaneous Daily  . pantoprazole  40 mg Oral Daily  . predniSONE  10 mg Oral Q breakfast  . sodium bicarbonate  650 mg Oral BID  .  sodium chloride  3 mL Intravenous Q12H  . sodium chloride  3 mL Intravenous Q12H   Continuous Infusions:   Time spent on care of this patient: 35 minutes   [place], MD 12/30/2013, 1:42 PM  LOS: 1 day   [facility] Office  [phone] Pager - Text Page per [email or web address]  If 7PM-7AM, please contact night-coverage [email or web address]

## 2013-12-31 DIAGNOSIS — N186 End stage renal disease: Secondary | ICD-10-CM

## 2013-12-31 DIAGNOSIS — E871 Hypo-osmolality and hyponatremia: Secondary | ICD-10-CM

## 2013-12-31 LAB — RENAL FUNCTION PANEL
Albumin: 3.2 g/dL — ABNORMAL LOW (ref 3.5–5.2)
Anion gap: 16 — ABNORMAL HIGH (ref 5–15)
BUN: 81 mg/dL — AB (ref 6–23)
CHLORIDE: 95 meq/L — AB (ref 96–112)
CO2: 26 meq/L (ref 19–32)
CREATININE: 5.47 mg/dL — AB (ref 0.50–1.35)
Calcium: 9.4 mg/dL (ref 8.4–10.5)
GFR calc Af Amer: 12 mL/min — ABNORMAL LOW (ref >90)
GFR calc non Af Amer: 10 mL/min — ABNORMAL LOW (ref >90)
GLUCOSE: 129 mg/dL — AB (ref 70–99)
Phosphorus: 4.3 mg/dL (ref 2.3–4.6)
Potassium: 4 mEq/L (ref 3.7–5.3)
Sodium: 137 mEq/L (ref 137–147)

## 2013-12-31 LAB — HEPATITIS B SURFACE ANTIGEN: Hepatitis B Surface Ag: NEGATIVE

## 2013-12-31 NOTE — Progress Notes (Signed)
Patient ID: Brian Lawson, male    DOB: Jan 28, 1956, 58 y.o.   MRN: [id number] Nephrology Progress Note  Assessment/Plan: Brian Lawson male with PMH CKD with solitary right kidney (hx left kidney cancer 05/2013), HTN, Gout, Hepatitis; admitted 12/29/2013 for uremia (creatinine elevation from 3.8 to 5.9), admitted for possible HD.  1. CKD: Scr trending down/stable, 5.91 nadir >> 5.47. 2. Hyponatremia: resolved 3. HTN: currently normal range on clonidine 4. Gout: pt on prednisone 10mg  daily 5. Anemia: stable  Okay to discharge from renal standpoint. No urgent need for dialysis at this time. We will arrange f/u labs in 1-2 weeks at the clinic.  Renal Attending:  I told him he could finish this semester of school if he chooses to and start dialysis in December but if worse we can begin sooner.  He can be discharged. Labs at our office  Prim Morace C  _________________________________________________________________ S: Feels much better. Not interested in starting dialysis at this time. Feels ready to go home.  O:BP 128/81  Pulse 78  Temp(Src) 97.5 F (36.4 C) (Oral)  Resp 18  Ht 6\' 3"  (1.905 m)  Wt 219 lb 5.7 oz (99.5 kg)  BMI 27.42 kg/m2  SpO2 99%  Intake/Output Summary (Last 24 hours) at 12/31/13 0758 Last data filed at 12/31/13 0530  Gross per 24 hour  Intake   1900 ml  Output    900 ml  Net   1000 ml   Intake/Output: I/O last 3 completed shifts: In: 2140 [P.O.:1540; I.V.:600] Out: 1100 [Urine:1100]  Intake/Output this shift:    Weight change: 2 lb 13.7 oz (1.296 kg) Gen: NAD CVS: RRR, no mrg Resp: CTAB Abd: soft, +BS Ext: no edema   Recent Labs Lab 12/29/13 2033 12/29/13 2057 12/30/13 0606 12/30/13 1637 12/31/13 0351  NA 131* 134* 135* 135* 137  K 3.9 3.8 5.2 4.5 4.0  CL 87* 95* 93* 91* 95*  CO2 25  --  23 28 26   GLUCOSE 127* 129* 178* 141* 129*  BUN 84* 86* 84* 83* 81*  CREATININE 5.91* 5.90* 5.98* 5.69* 5.47*  ALBUMIN 3.8  --  3.5  --  3.2*  CALCIUM  10.6*  --  10.1 10.3 9.4  PHOS  --   --  3.8  --  4.3  AST 13  --  14  --   --   ALT 10  --  9  --   --    Liver Function Tests:  Recent Labs Lab 12/29/13 2033 12/30/13 0606 12/31/13 0351  AST 13 14  --   ALT 10 9  --   ALKPHOS 49 45  --   BILITOT 0.3 0.2*  --   PROT 8.4* 8.2  --   ALBUMIN 3.8 3.5 3.2*   No results found for this basename: LIPASE, AMYLASE,  in the last 168 hours No results found for this basename: AMMONIA,  in the last 168 hours CBC:  Recent Labs Lab 12/29/13 2033 12/29/13 2057 12/30/13 0606  WBC 8.9  --  8.4  NEUTROABS 6.0  --   --   HGB 12.5* 14.6 11.9*  HCT 38.5* 43.0 37.1*  MCV 86.3  --  86.3  PLT 277  --  281   Cardiac Enzymes: No results found for this basename: CKTOTAL, CKMB, CKMBINDEX, TROPONINI,  in the last 168 hours CBG: No results found for this basename: GLUCAP,  in the last 168 hours  Iron Studies: No results found for this basename: IRON, TIBC, TRANSFERRIN, FERRITIN,  in  the last 72 hours Studies/Results: Dg Chest 2 View  12/29/2013   CLINICAL DATA:  58 year old male with acute shortness of breath and nausea. Current history of stage IV renal failure not yet on dialysis. Initial encounter. Personal history of left renal cell carcinoma status post nephrectomy.  EXAM: CHEST  2 VIEW  COMPARISON:  Chest CT 09/13/2013 and earlier.  FINDINGS: Upright AP and lateral views of the chest. Stable lung volumes. Stable cardiac size and mediastinal contours. Visualized tracheal air column is within normal limits. No pneumothorax or pulmonary edema. No pleural effusion or confluent pulmonary opacity. No acute osseous abnormality identified.  IMPRESSION: No acute or metastatic cardiopulmonary abnormality.   Electronically Signed   By: Lars Pinks M.D.   On: 12/29/2013 20:26   . calcium acetate  667 mg Oral TID WC  . cloNIDine  0.2 mg Oral 3 times per day  . docusate sodium  100 mg Oral BID  . enoxaparin (LOVENOX) injection  30 mg Subcutaneous Daily  .  pantoprazole  40 mg Oral Daily  . predniSONE  10 mg Oral Q breakfast  . sodium bicarbonate  650 mg Oral BID  . sodium chloride  3 mL Intravenous Q12H  . sodium chloride  3 mL Intravenous Q12H    BMET    Component Value Date/Time   NA 137 12/31/2013 0351   K 4.0 12/31/2013 0351   CL 95* 12/31/2013 0351   CO2 26 12/31/2013 0351   GLUCOSE 129* 12/31/2013 0351   BUN 81* 12/31/2013 0351   CREATININE 5.47* 12/31/2013 0351   CALCIUM 9.4 12/31/2013 0351   GFRNONAA 10* 12/31/2013 0351   GFRAA 12* 12/31/2013 0351   CBC    Component Value Date/Time   WBC 8.4 12/30/2013 0606   RBC 4.30 12/30/2013 0606   HGB 11.9* 12/30/2013 0606   HCT 37.1* 12/30/2013 0606   PLT 281 12/30/2013 0606   MCV 86.3 12/30/2013 0606   MCH 27.7 12/30/2013 0606   MCHC 32.1 12/30/2013 0606   RDW 16.0* 12/30/2013 0606   LYMPHSABS 2.3 12/29/2013 2033   MONOABS 0.6 12/29/2013 2033   EOSABS 0.0 12/29/2013 2033   BASOSABS 0.0 12/29/2013 2033    [place] Sat 7:58 AM

## 2013-12-31 NOTE — Discharge Summary (Signed)
Physician Discharge Summary  Brian Lawson [id number] DOB: [birth year] DOA: 12/29/2013  PCP: Criselda Peaches, MD  Admit date: 12/29/2013 Discharge date: 12/31/2013  Time spent: >45 minutes  Recommendations for Outpatient Follow-up:  1. F/u with nephrology in 1-2 wks  Discharge Condition: stable Diet recommendation: renal diet  Discharge Diagnoses:  Principal Problem:   AKI (acute kidney injury) Active Problems:   CKD (chronic kidney disease) stage 3, GFR 30-59 ml/min   Essential hypertension   H/O renal cell cancer   Uremia   Hyponatremia   History of present illness:  Brian Lawson is a 58 y.o. male with past medical history of hypertension, gout, CKD status post left renal nephrectomy for renal cell cancer. The patient was advised by his nephrologist to come to the hospital and is presenting with worsening BUN and creatinine, nausea, weight loss and fatigue which is being attributed to uremia.   Hospital Course:  Principal Problem:  AKI -Uremia with underlying CKD stage 3, GFR 30-59 ml/min  -Admitted for further management-started on IVF by renal team with a marginal improvement in Cr- at this point the renal team does not feel that he needs urgent dialysis and is allowing that he go home and follow up as outpt  - per renal recommendation, will resume his home dose of Lasix -Continue sodium bicarbonate  - recommend Bmet in 1wk  Active Problems:  Essential hypertension  - Continue clonidine - BP has been stable on this alone - Hydralazine and Imdur has been on hold - have advised patient to check his BP at home BID and resume these meds gradually if SBP > 130  Renal cell cancer  Status post nephrectomy   Gout  - Takes prednisone chronically for this   Procedures:  none  Consultations:  Nephrology  Discharge Exam: Filed Weights   12/30/13 0135 12/31/13 0526  Weight: 98.204 kg (216 lb 8 oz) 99.5 kg (219 lb 5.7 oz)   Filed Vitals:   12/31/13 1415  BP:  122/84  Pulse: 78  Temp: 97.9 F (36.6 C)  Resp: 18    General: AAO x 3, no distress Cardiovascular: RRR, no murmurs  Respiratory: clear to auscultation bilaterally GI: soft, non-tender, non-distended, bowel sound positive  Discharge Instructions You were cared for by a hospitalist during your hospital stay. If you have any questions about your discharge medications or the care you received while you were in the hospital after you are discharged, you can call the unit and asked to speak with the hospitalist on call if the hospitalist that took care of you is not available. Once you are discharged, your primary care physician will handle any further medical issues. Please note that NO REFILLS for any discharge medications will be authorized once you are discharged, as it is imperative that you return to your primary care physician (or establish a relationship with a primary care physician if you do not have one) for your aftercare needs so that they can reassess your need for medications and monitor your lab values.      Discharge Instructions   Diet - low sodium heart healthy    Complete by:  As directed      Increase activity slowly    Complete by:  As directed             Medication List    STOP taking these medications       hydrALAZINE 25 MG tablet  Commonly known as:  APRESOLINE  hydrALAZINE 50 MG tablet  Commonly known as:  APRESOLINE     isosorbide dinitrate 5 MG tablet  Commonly known as:  ISORDIL      TAKE these medications       calcium acetate 667 MG capsule  Commonly known as:  PHOSLO  Take 1,334 mg by mouth 3 (three) times daily with meals.     cholecalciferol 1000 UNITS tablet  Commonly known as:  VITAMIN D  Take 1,000 Units by mouth daily.     cloNIDine 0.2 MG tablet  Commonly known as:  CATAPRES  Take 0.2 mg by mouth 3 (three) times daily.     docusate sodium 100 MG capsule  Commonly known as:  COLACE  Take 100 mg by mouth 2 (two) times  daily.     furosemide 40 MG tablet  Commonly known as:  LASIX  Take 40 mg by mouth daily.     omeprazole 40 MG capsule  Commonly known as:  PRILOSEC  Take 40 mg by mouth every morning.     predniSONE 10 MG tablet  Commonly known as:  DELTASONE  Take 10 mg by mouth daily with breakfast.     sodium bicarbonate 650 MG tablet  Take 650 mg by mouth 2 (two) times daily.       No Known Allergies    The results of significant diagnostics from this hospitalization (including imaging, microbiology, ancillary and laboratory) are listed below for reference.    Significant Diagnostic Studies: Dg Chest 2 View  12/29/2013   CLINICAL DATA:  58 year old male with acute shortness of breath and nausea. Current history of stage IV renal failure not yet on dialysis. Initial encounter. Personal history of left renal cell carcinoma status post nephrectomy.  EXAM: CHEST  2 VIEW  COMPARISON:  Chest CT 09/13/2013 and earlier.  FINDINGS: Upright AP and lateral views of the chest. Stable lung volumes. Stable cardiac size and mediastinal contours. Visualized tracheal air column is within normal limits. No pneumothorax or pulmonary edema. No pleural effusion or confluent pulmonary opacity. No acute osseous abnormality identified.  IMPRESSION: No acute or metastatic cardiopulmonary abnormality.   Electronically Signed   By: Lars Pinks M.D.   On: 12/29/2013 20:26    Microbiology: No results found for this or any previous visit (from the past 240 hour(s)).   Labs: Basic Metabolic Panel:  Recent Labs Lab 12/29/13 2033 12/29/13 2057 12/30/13 0606 12/30/13 1637 12/31/13 0351  NA 131* 134* 135* 135* 137  K 3.9 3.8 5.2 4.5 4.0  CL 87* 95* 93* 91* 95*  CO2 25  --  23 28 26   GLUCOSE 127* 129* 178* 141* 129*  BUN 84* 86* 84* 83* 81*  CREATININE 5.91* 5.90* 5.98* 5.69* 5.47*  CALCIUM 10.6*  --  10.1 10.3 9.4  MG  --   --  2.4  --   --   PHOS  --   --  3.8  --  4.3   Liver Function Tests:  Recent  Labs Lab 12/29/13 2033 12/30/13 0606 12/31/13 0351  AST 13 14  --   ALT 10 9  --   ALKPHOS 49 45  --   BILITOT 0.3 0.2*  --   PROT 8.4* 8.2  --   ALBUMIN 3.8 3.5 3.2*   No results found for this basename: LIPASE, AMYLASE,  in the last 168 hours No results found for this basename: AMMONIA,  in the last 168 hours CBC:  Recent Labs Lab 12/29/13 2033 12/29/13  2057 12/30/13 0606  WBC 8.9  --  8.4  NEUTROABS 6.0  --   --   HGB 12.5* 14.6 11.9*  HCT 38.5* 43.0 37.1*  MCV 86.3  --  86.3  PLT 277  --  281   Cardiac Enzymes: No results found for this basename: CKTOTAL, CKMB, CKMBINDEX, TROPONINI,  in the last 168 hours BNP: BNP (last 3 results)  Recent Labs  12/29/13 2033  PROBNP 90.8   CBG: No results found for this basename: GLUCAP,  in the last 168 hours     Signed:  Debbe Odea, MD [facility] 12/31/2013, 2:30 PM

## 2014-01-03 LAB — PARATHYROID HORMONE, INTACT (NO CA): PTH: 118 pg/mL — AB (ref 14–64)

## 2014-01-03 NOTE — Progress Notes (Signed)
UR completed (Retro)   [place] Hy K. Suhailah Kwan, RN, BSN, [place], CCM  01/03/2014 12:15 PM

## 2014-03-08 ENCOUNTER — Other Ambulatory Visit (HOSPITAL_COMMUNITY): Payer: Self-pay | Admitting: Urology

## 2014-03-08 DIAGNOSIS — C649 Malignant neoplasm of unspecified kidney, except renal pelvis: Secondary | ICD-10-CM

## 2014-03-11 ENCOUNTER — Encounter (HOSPITAL_COMMUNITY): Payer: Self-pay | Admitting: Emergency Medicine

## 2014-03-11 ENCOUNTER — Emergency Department (HOSPITAL_COMMUNITY)
Admission: EM | Admit: 2014-03-11 | Discharge: 2014-03-11 | Disposition: A | Discharge status: Home / Self Care | Payer: Medicaid Other | Attending: Emergency Medicine | Admitting: Emergency Medicine

## 2014-03-11 DIAGNOSIS — R61 Generalized hyperhidrosis: Secondary | ICD-10-CM | POA: Insufficient documentation

## 2014-03-11 DIAGNOSIS — Z8701 Personal history of pneumonia (recurrent): Secondary | ICD-10-CM | POA: Insufficient documentation

## 2014-03-11 DIAGNOSIS — M109 Gout, unspecified: Secondary | ICD-10-CM | POA: Insufficient documentation

## 2014-03-11 DIAGNOSIS — Z87891 Personal history of nicotine dependence: Secondary | ICD-10-CM | POA: Insufficient documentation

## 2014-03-11 DIAGNOSIS — Z7952 Long term (current) use of systemic steroids: Secondary | ICD-10-CM | POA: Insufficient documentation

## 2014-03-11 DIAGNOSIS — M1990 Unspecified osteoarthritis, unspecified site: Secondary | ICD-10-CM | POA: Insufficient documentation

## 2014-03-11 DIAGNOSIS — R609 Edema, unspecified: Secondary | ICD-10-CM | POA: Insufficient documentation

## 2014-03-11 DIAGNOSIS — N189 Chronic kidney disease, unspecified: Secondary | ICD-10-CM | POA: Insufficient documentation

## 2014-03-11 DIAGNOSIS — Z8719 Personal history of other diseases of the digestive system: Secondary | ICD-10-CM | POA: Insufficient documentation

## 2014-03-11 DIAGNOSIS — R55 Syncope and collapse: Secondary | ICD-10-CM | POA: Insufficient documentation

## 2014-03-11 DIAGNOSIS — Z85528 Personal history of other malignant neoplasm of kidney: Secondary | ICD-10-CM | POA: Insufficient documentation

## 2014-03-11 DIAGNOSIS — Z79899 Other long term (current) drug therapy: Secondary | ICD-10-CM | POA: Insufficient documentation

## 2014-03-11 DIAGNOSIS — I129 Hypertensive chronic kidney disease with stage 1 through stage 4 chronic kidney disease, or unspecified chronic kidney disease: Secondary | ICD-10-CM | POA: Insufficient documentation

## 2014-03-11 LAB — BASIC METABOLIC PANEL
Anion gap: 14 (ref 5–15)
BUN: 39 mg/dL — ABNORMAL HIGH (ref 6–23)
CO2: 23 mmol/L (ref 19–32)
Calcium: 9.5 mg/dL (ref 8.4–10.5)
Chloride: 101 mEq/L (ref 96–112)
Creatinine, Ser: 4.54 mg/dL — ABNORMAL HIGH (ref 0.50–1.35)
GFR calc Af Amer: 15 mL/min — ABNORMAL LOW (ref >90)
GFR calc non Af Amer: 13 mL/min — ABNORMAL LOW (ref >90)
Glucose, Bld: 121 mg/dL — ABNORMAL HIGH (ref 70–99)
Potassium: 3.9 mmol/L (ref 3.5–5.1)
Sodium: 138 mmol/L (ref 135–145)

## 2014-03-11 LAB — CBC WITH DIFFERENTIAL/PLATELET
Basophils Absolute: 0 10*3/uL (ref 0.0–0.1)
Basophils Relative: 0 % (ref 0–1)
Eosinophils Absolute: 0.1 10*3/uL (ref 0.0–0.7)
Eosinophils Relative: 1 % (ref 0–5)
HCT: 39.2 % (ref 39.0–52.0)
Hemoglobin: 12.3 g/dL — ABNORMAL LOW (ref 13.0–17.0)
Lymphocytes Relative: 29 % (ref 12–46)
Lymphs Abs: 2 10*3/uL (ref 0.7–4.0)
MCH: 28.1 pg (ref 26.0–34.0)
MCHC: 31.4 g/dL (ref 30.0–36.0)
MCV: 89.5 fL (ref 78.0–100.0)
Monocytes Absolute: 0.6 10*3/uL (ref 0.1–1.0)
Monocytes Relative: 9 % (ref 3–12)
Neutro Abs: 4.4 10*3/uL (ref 1.7–7.7)
Neutrophils Relative %: 61 % (ref 43–77)
Platelets: 211 10*3/uL (ref 150–400)
RBC: 4.38 MIL/uL (ref 4.22–5.81)
RDW: 17.3 % — ABNORMAL HIGH (ref 11.5–15.5)
WBC: 7.1 10*3/uL (ref 4.0–10.5)

## 2014-03-11 LAB — TROPONIN I: Troponin I: <0.03 ng/mL (ref <0.031)

## 2014-03-11 MED ORDER — SODIUM CHLORIDE 0.9 % IV BOLUS (SEPSIS)
500.0000 mL | Freq: Once | INTRAVENOUS | Status: AC
  Administered 2014-03-11: 500 mL via INTRAVENOUS

## 2014-03-11 NOTE — ED Notes (Signed)
Per EMS- pt was at his mothers house, was walking and felt weak in the legs. Sat down. Denies LOC. First responders noticed pt to be pale and diaphoretic. Pt denies pain. Pt has right arm restriction. Had a mass on kidney removed in March, has and appointment to have a follow up CT and chest X ray and blood work coming up. CBG 200. Negative for orthostatics when tested on scene.

## 2014-03-11 NOTE — Discharge Instructions (Signed)
Syncope °Syncope is a medical term for fainting or passing out. This means you lose consciousness and drop to the ground. People are generally unconscious for less than 5 minutes. You may have some muscle twitches for up to 15 seconds before waking up and returning to normal. Syncope occurs more often in older adults, but it can happen to anyone. While most causes of syncope are not dangerous, syncope can be a sign of a serious medical problem. It is important to seek medical care.  °CAUSES  °Syncope is caused by a sudden drop in blood flow to the brain. The specific cause is often not determined. Factors that can bring on syncope include: °· Taking medicines that lower blood pressure. °· Sudden changes in posture, such as standing up quickly. °· Taking more medicine than prescribed. °· Standing in one place for too long. °· Seizure disorders. °· Dehydration and excessive exposure to heat. °· Low blood sugar (hypoglycemia). °· Straining to have a bowel movement. °· Heart disease, irregular heartbeat, or other circulatory problems. °· Fear, emotional distress, seeing blood, or severe pain. °SYMPTOMS  °Right before fainting, you may: °· Feel dizzy or light-headed. °· Feel nauseous. °· See all white or all black in your field of vision. °· Have cold, clammy skin. °DIAGNOSIS  °Your health care provider will ask about your symptoms, perform a physical exam, and perform an electrocardiogram (ECG) to record the electrical activity of your heart. Your health care provider may also perform other heart or blood tests to determine the cause of your syncope which may include: °· Transthoracic echocardiogram (TTE). During echocardiography, sound waves are used to evaluate how blood flows through your heart. °· Transesophageal echocardiogram (TEE). °· Cardiac monitoring. This allows your health care provider to monitor your heart rate and rhythm in real time. °· Holter monitor. This is a portable device that records your  heartbeat and can help diagnose heart arrhythmias. It allows your health care provider to track your heart activity for several days, if needed. °· Stress tests by exercise or by giving medicine that makes the heart beat faster. °TREATMENT  °In most cases, no treatment is needed. Depending on the cause of your syncope, your health care provider may recommend changing or stopping some of your medicines. °HOME CARE INSTRUCTIONS °· Have someone stay with you until you feel stable. °· Do not drive, use machinery, or play sports until your health care provider says it is okay. °· Keep all follow-up appointments as directed by your health care provider. °· Lie down right away if you start feeling like you might faint. Breathe deeply and steadily. Wait until all the symptoms have passed. °· Drink enough fluids to keep your urine clear or pale yellow. °· If you are taking blood pressure or heart medicine, get up slowly and take several minutes to sit and then stand. This can reduce dizziness. °SEEK IMMEDIATE MEDICAL CARE IF:  °· You have a severe headache. °· You have unusual pain in the chest, abdomen, or back. °· You are bleeding from your mouth or rectum, or you have black or tarry stool. °· You have an irregular or very fast heartbeat. °· You have pain with breathing. °· You have repeated fainting or seizure-like jerking during an episode. °· You faint when sitting or lying down. °· You have confusion. °· You have trouble walking. °· You have severe weakness. °· You have vision problems. °If you fainted, call your local emergency services (911 in [place] Do not drive   yourself to the hospital.  °MAKE SURE YOU: °· Understand these instructions. °· Will watch your condition. °· Will get help right away if you are not doing well or get worse. °Document Released: 02/25/2005 Document Revised: 03/02/2013 Document Reviewed: 04/26/2011 °[employer]® Patient Information ©2015 [employer]. This information is not intended to replace  advice given to you by your health care provider. Make sure you discuss any questions you have with your health care provider. ° °

## 2014-03-11 NOTE — ED Notes (Signed)
Pt reports decreased appetite for three days. Pt reports only eating one meal today.

## 2014-03-11 NOTE — ED Notes (Signed)
Pt states unable to void but still makes urine 3-4 times a day. Refuses in and out cath protocol.

## 2014-03-15 NOTE — ED Provider Notes (Signed)
CSN: [id number]     Arrival date & time 03/11/14  2001 History   First MD Initiated Contact with Patient 03/11/14 2002     Chief Complaint  Patient presents with  . Near Syncope     (Consider location/radiation/quality/duration/timing/severity/associated sxs/prior Treatment) HPI   58yM with near syncope/possibly syncope just prior to arrival. Was ambulating when began to feel dizzy and generally weak. Sat down and may have had very brief LOC per father. No incontinence. No oral trauma. Currently no complaints. Denies CP, palpitations, SOB during this episode or currently. EMS reports that pt did appear pale and diaphoretic on their arrival though. Usual state of health just prior to this. No fever or chills. No n/v. Appetite has been poor recently but reports still drinking well because of known kidney disease. Hasn't noticed significant change in urinary output or other acute urinary complaints.   Past Medical History  Diagnosis Date  . Hypertension   . Gout   . Hepatitis     30 years ago  . Arthritis     OA  . Chronic kidney disease     ACUTE RENAL FAILURE 12/2012 STATUS POST RT ARM AV FISTULA - HAS NOT STARTED DIALYSIS; LEFT RENAL MASS  . Pneumonia     HOSPITALIZED 12/2012 LEGIONLLA PNEUMONIA  . Cancer renal lt nephrectomy   Past Surgical History  Procedure Laterality Date  . Hernia repair    . Bascilic vein transposition Right 01/18/2013    Procedure: BASCILIC VEIN TRANSPOSITION;  Surgeon: Elam Dutch, MD;  Location: [facility];  Service: Vascular;  Laterality: Right;  . Bascilic vein transposition Right 04/27/2013    Procedure: BASCILIC VEIN TRANSPOSITION AND REVISION;  Surgeon: Elam Dutch, MD;  Location: [facility];  Service: Vascular;  Laterality: Right;  . Laparoscopic nephrectomy Left 05/28/2013    Procedure: LEFT LAPAROSCOPIC  RADICAL NEPHRECTOMY;  Surgeon: Ardis Hughs, MD;  Location: [facility];  Service: Urology;  Laterality: Left;   Family History  Problem  Relation Age of Onset  . Hypertension Mother   . Cancer Father   . Hypertension Sister    History  Substance Use Topics  . Smoking status: Former Research scientist (life sciences)  . Smokeless tobacco: Never Used  . Alcohol Use: Yes     Comment: ocassionally  QUIT SMOKING IN 2014    Review of Systems  All systems reviewed and negative, other than as noted in HPI.   Allergies  Review of patient's allergies indicates no known allergies.  Home Medications   Prior to Admission medications   Medication Sig Start Date End Date Taking? Authorizing Provider  allopurinol (ZYLOPRIM) 300 MG tablet Take 300 mg by mouth daily.   Yes Historical Provider, MD  calcium acetate (PHOSLO) 667 MG capsule Take 1,334 mg by mouth 3 (three) times daily with meals. 01/25/13  Yes Adeline Saralyn Pilar, MD  cholecalciferol (VITAMIN D) 1000 UNITS tablet Take 1,000 Units by mouth daily.   Yes Historical Provider, MD  cloNIDine (CATAPRES) 0.2 MG tablet Take 0.2 mg by mouth 3 (three) times daily. 01/25/13  Yes Adeline Saralyn Pilar, MD  docusate sodium (COLACE) 100 MG capsule Take 100 mg by mouth 2 (two) times daily.   Yes Historical Provider, MD  furosemide (LASIX) 40 MG tablet Take 40 mg by mouth daily.   Yes Historical Provider, MD  hydrALAZINE (APRESOLINE) 25 MG tablet Take 25 mg by mouth daily.   Yes Historical Provider, MD  hydrALAZINE (APRESOLINE) 50 MG tablet Take 50 mg by mouth  daily.   Yes Historical Provider, MD  omeprazole (PRILOSEC) 40 MG capsule Take 40 mg by mouth every morning.  01/25/13  Yes Adeline Saralyn Pilar, MD  predniSONE (DELTASONE) 10 MG tablet Take 10 mg by mouth daily with breakfast.   Yes Historical Provider, MD  sodium bicarbonate 650 MG tablet Take 650 mg by mouth 2 (two) times daily. 01/25/13  Yes Adeline C Viyuoh, MD   BP 140/85 mmHg  Pulse 97  Temp(Src) 98.1 F (36.7 C) (Oral)  Resp 17  Ht 6\' 3"  (1.905 m)  Wt 215 lb (97.523 kg)  BMI 26.87 kg/m2  SpO2 98% Physical Exam  Constitutional: He appears  well-developed and well-nourished. No distress.  HENT:  Head: Normocephalic and atraumatic.  Eyes: Conjunctivae are normal. Right eye exhibits no discharge. Left eye exhibits no discharge.  Neck: Neck supple.  Cardiovascular: Normal rate, regular rhythm and normal heart sounds.  Exam reveals no gallop and no friction rub.   No murmur heard. Pulmonary/Chest: Effort normal and breath sounds normal. No respiratory distress.  Abdominal: Soft. He exhibits no distension. There is no tenderness.  Musculoskeletal: He exhibits edema. He exhibits no tenderness.  Minimal symmetric LE edema. No calf tenderness.   Neurological: He is alert.  Skin: Skin is warm and dry.  Psychiatric: He has a normal mood and affect. His behavior is normal. Thought content normal.  Nursing note and vitals reviewed.   ED Course  Procedures (including critical care time) Labs Review Labs Reviewed  CBC WITH DIFFERENTIAL - Abnormal; Notable for the following:    Hemoglobin 12.3 (*)    RDW 17.3 (*)    All other components within normal limits  BASIC METABOLIC PANEL - Abnormal; Notable for the following:    Glucose, Bld 121 (*)    BUN 39 (*)    Creatinine, Ser 4.54 (*)    GFR calc non Af Amer 13 (*)    GFR calc Af Amer 15 (*)    All other components within normal limits  TROPONIN I    Imaging Review No results found.   EKG Interpretation None      MDM   Final diagnoses:  Syncope and collapse    58yM with near syncopal symptoms and possibly syncope but not completely clear on history. Currently no complaints. W/u fairly unremarkable with consideration of known renal dysnfunction.     Virgel Manifold, MD 03/15/14 1007

## 2014-03-18 ENCOUNTER — Ambulatory Visit (HOSPITAL_COMMUNITY)
Admission: RE | Admit: 2014-03-18 | Discharge: 2014-03-18 | Disposition: A | Discharge status: Home / Self Care | Payer: Medicaid Other | Source: Ambulatory Visit | Attending: Urology | Admitting: Urology

## 2014-03-18 DIAGNOSIS — M109 Gout, unspecified: Secondary | ICD-10-CM | POA: Insufficient documentation

## 2014-03-18 DIAGNOSIS — N189 Chronic kidney disease, unspecified: Secondary | ICD-10-CM | POA: Insufficient documentation

## 2014-03-18 DIAGNOSIS — Z905 Acquired absence of kidney: Secondary | ICD-10-CM | POA: Insufficient documentation

## 2014-03-18 DIAGNOSIS — K5730 Diverticulosis of large intestine without perforation or abscess without bleeding: Secondary | ICD-10-CM | POA: Insufficient documentation

## 2014-03-18 DIAGNOSIS — J984 Other disorders of lung: Secondary | ICD-10-CM | POA: Insufficient documentation

## 2014-03-18 DIAGNOSIS — M5136 Other intervertebral disc degeneration, lumbar region: Secondary | ICD-10-CM | POA: Insufficient documentation

## 2014-03-18 DIAGNOSIS — I129 Hypertensive chronic kidney disease with stage 1 through stage 4 chronic kidney disease, or unspecified chronic kidney disease: Secondary | ICD-10-CM | POA: Insufficient documentation

## 2014-03-18 DIAGNOSIS — R935 Abnormal findings on diagnostic imaging of other abdominal regions, including retroperitoneum: Secondary | ICD-10-CM | POA: Insufficient documentation

## 2014-03-18 DIAGNOSIS — C649 Malignant neoplasm of unspecified kidney, except renal pelvis: Secondary | ICD-10-CM

## 2014-03-18 DIAGNOSIS — M4186 Other forms of scoliosis, lumbar region: Secondary | ICD-10-CM | POA: Insufficient documentation

## 2014-03-18 DIAGNOSIS — I700 Atherosclerosis of aorta: Secondary | ICD-10-CM | POA: Insufficient documentation

## 2014-03-18 DIAGNOSIS — M47896 Other spondylosis, lumbar region: Secondary | ICD-10-CM | POA: Insufficient documentation

## 2014-03-18 DIAGNOSIS — C642 Malignant neoplasm of left kidney, except renal pelvis: Secondary | ICD-10-CM | POA: Insufficient documentation

## 2014-03-24 ENCOUNTER — Ambulatory Visit (HOSPITAL_COMMUNITY)
Admission: RE | Admit: 2014-03-24 | Discharge: 2014-03-24 | Disposition: A | Discharge status: Home / Self Care | Payer: Self-pay | Source: Ambulatory Visit | Attending: Urology | Admitting: Urology

## 2014-03-24 ENCOUNTER — Other Ambulatory Visit (HOSPITAL_COMMUNITY): Payer: Self-pay | Admitting: Urology

## 2014-03-24 DIAGNOSIS — C649 Malignant neoplasm of unspecified kidney, except renal pelvis: Secondary | ICD-10-CM | POA: Insufficient documentation

## 2014-03-24 DIAGNOSIS — F17210 Nicotine dependence, cigarettes, uncomplicated: Secondary | ICD-10-CM | POA: Insufficient documentation

## 2014-09-20 ENCOUNTER — Other Ambulatory Visit: Payer: Self-pay | Admitting: Urology

## 2014-09-20 ENCOUNTER — Ambulatory Visit (HOSPITAL_COMMUNITY)
Admission: RE | Admit: 2014-09-20 | Discharge: 2014-09-20 | Disposition: A | Discharge status: Home / Self Care | Payer: Medicaid Other | Source: Ambulatory Visit | Attending: Urology | Admitting: Urology

## 2014-09-20 DIAGNOSIS — C649 Malignant neoplasm of unspecified kidney, except renal pelvis: Secondary | ICD-10-CM

## 2014-09-20 DIAGNOSIS — Z85528 Personal history of other malignant neoplasm of kidney: Secondary | ICD-10-CM | POA: Insufficient documentation

## 2014-09-20 DIAGNOSIS — I10 Essential (primary) hypertension: Secondary | ICD-10-CM | POA: Insufficient documentation

## 2014-09-21 ENCOUNTER — Inpatient Hospital Stay (HOSPITAL_COMMUNITY)
Admission: EM | Admit: 2014-09-21 | Discharge: 2014-09-26 | DRG: 392 | Disposition: A | Discharge status: Home / Self Care | Payer: Medicaid Other | Attending: Internal Medicine | Admitting: Internal Medicine

## 2014-09-21 ENCOUNTER — Encounter (HOSPITAL_COMMUNITY): Payer: Self-pay | Admitting: *Deleted

## 2014-09-21 DIAGNOSIS — K5730 Diverticulosis of large intestine without perforation or abscess without bleeding: Principal | ICD-10-CM | POA: Diagnosis present

## 2014-09-21 DIAGNOSIS — Z809 Family history of malignant neoplasm, unspecified: Secondary | ICD-10-CM

## 2014-09-21 DIAGNOSIS — Z87891 Personal history of nicotine dependence: Secondary | ICD-10-CM | POA: Diagnosis not present

## 2014-09-21 DIAGNOSIS — Z8249 Family history of ischemic heart disease and other diseases of the circulatory system: Secondary | ICD-10-CM

## 2014-09-21 DIAGNOSIS — Z7952 Long term (current) use of systemic steroids: Secondary | ICD-10-CM | POA: Diagnosis not present

## 2014-09-21 DIAGNOSIS — N184 Chronic kidney disease, stage 4 (severe): Secondary | ICD-10-CM | POA: Diagnosis present

## 2014-09-21 DIAGNOSIS — E663 Overweight: Secondary | ICD-10-CM | POA: Diagnosis present

## 2014-09-21 DIAGNOSIS — M109 Gout, unspecified: Secondary | ICD-10-CM | POA: Diagnosis present

## 2014-09-21 DIAGNOSIS — Z85528 Personal history of other malignant neoplasm of kidney: Secondary | ICD-10-CM

## 2014-09-21 DIAGNOSIS — Z905 Acquired absence of kidney: Secondary | ICD-10-CM | POA: Diagnosis present

## 2014-09-21 DIAGNOSIS — M1990 Unspecified osteoarthritis, unspecified site: Secondary | ICD-10-CM | POA: Diagnosis present

## 2014-09-21 DIAGNOSIS — D62 Acute posthemorrhagic anemia: Secondary | ICD-10-CM | POA: Diagnosis present

## 2014-09-21 DIAGNOSIS — D500 Iron deficiency anemia secondary to blood loss (chronic): Secondary | ICD-10-CM | POA: Diagnosis present

## 2014-09-21 DIAGNOSIS — I10 Essential (primary) hypertension: Secondary | ICD-10-CM | POA: Diagnosis not present

## 2014-09-21 DIAGNOSIS — K922 Gastrointestinal hemorrhage, unspecified: Secondary | ICD-10-CM | POA: Diagnosis not present

## 2014-09-21 DIAGNOSIS — I959 Hypotension, unspecified: Secondary | ICD-10-CM | POA: Diagnosis present

## 2014-09-21 DIAGNOSIS — K5900 Constipation, unspecified: Secondary | ICD-10-CM | POA: Diagnosis present

## 2014-09-21 DIAGNOSIS — C649 Malignant neoplasm of unspecified kidney, except renal pelvis: Secondary | ICD-10-CM

## 2014-09-21 DIAGNOSIS — I129 Hypertensive chronic kidney disease with stage 1 through stage 4 chronic kidney disease, or unspecified chronic kidney disease: Secondary | ICD-10-CM | POA: Diagnosis present

## 2014-09-21 DIAGNOSIS — Z6828 Body mass index (BMI) 28.0-28.9, adult: Secondary | ICD-10-CM | POA: Diagnosis not present

## 2014-09-21 DIAGNOSIS — K625 Hemorrhage of anus and rectum: Secondary | ICD-10-CM | POA: Diagnosis present

## 2014-09-21 DIAGNOSIS — B1910 Unspecified viral hepatitis B without hepatic coma: Secondary | ICD-10-CM | POA: Diagnosis present

## 2014-09-21 LAB — COMPREHENSIVE METABOLIC PANEL
ALT: 9 U/L — AB (ref 17–63)
AST: 14 U/L — ABNORMAL LOW (ref 15–41)
Albumin: 3.1 g/dL — ABNORMAL LOW (ref 3.5–5.0)
Alkaline Phosphatase: 42 U/L (ref 38–126)
Anion gap: 9 (ref 5–15)
BUN: 51 mg/dL — ABNORMAL HIGH (ref 6–20)
CHLORIDE: 104 mmol/L (ref 101–111)
CO2: 26 mmol/L (ref 22–32)
Calcium: 9.3 mg/dL (ref 8.9–10.3)
Creatinine, Ser: 3.8 mg/dL — ABNORMAL HIGH (ref 0.61–1.24)
GFR, EST AFRICAN AMERICAN: 18 mL/min — AB (ref >60)
GFR, EST NON AFRICAN AMERICAN: 16 mL/min — AB (ref >60)
Glucose, Bld: 113 mg/dL — ABNORMAL HIGH (ref 65–99)
Potassium: 4.2 mmol/L (ref 3.5–5.1)
SODIUM: 139 mmol/L (ref 135–145)
Total Bilirubin: 0.4 mg/dL (ref 0.3–1.2)
Total Protein: 6.5 g/dL (ref 6.5–8.1)

## 2014-09-21 LAB — CBC
HCT: 27.1 % — ABNORMAL LOW (ref 39.0–52.0)
HEMOGLOBIN: 8.4 g/dL — AB (ref 13.0–17.0)
MCH: 26.4 pg (ref 26.0–34.0)
MCHC: 31 g/dL (ref 30.0–36.0)
MCV: 85.2 fL (ref 78.0–100.0)
PLATELETS: 238 10*3/uL (ref 150–400)
RBC: 3.18 MIL/uL — AB (ref 4.22–5.81)
RDW: 17.8 % — AB (ref 11.5–15.5)
WBC: 10.3 10*3/uL (ref 4.0–10.5)

## 2014-09-21 LAB — PROTIME-INR
INR: 1.12 (ref 0.00–1.49)
PROTHROMBIN TIME: 14.6 s (ref 11.6–15.2)

## 2014-09-21 LAB — POC OCCULT BLOOD, ED: FECAL OCCULT BLD: POSITIVE — AB

## 2014-09-21 LAB — I-STAT CG4 LACTIC ACID, ED: Lactic Acid, Venous: 1.64 mmol/L (ref 0.5–2.0)

## 2014-09-21 MED ORDER — ALLOPURINOL 300 MG PO TABS: 300.0000 mg | ORAL_TABLET | Freq: Every day | ORAL | Status: DC

## 2014-09-21 MED ORDER — PANTOPRAZOLE SODIUM 40 MG PO TBEC: 80.0000 mg | DELAYED_RELEASE_TABLET | Freq: Every day | ORAL | Status: DC

## 2014-09-21 MED ORDER — PREDNISONE 10 MG PO TABS
10.0000 mg | ORAL_TABLET | Freq: Every day | ORAL | Status: DC
  Administered 2014-09-22 – 2014-09-26 (×5): 10 mg via ORAL
  Filled 2014-09-21 (×9): qty 1

## 2014-09-21 MED ORDER — CALCIUM ACETATE 667 MG PO CAPS
1334.0000 mg | ORAL_CAPSULE | Freq: Three times a day (TID) | ORAL | Status: DC
  Administered 2014-09-22 – 2014-09-26 (×13): 1334 mg via ORAL
  Filled 2014-09-21 (×16): qty 2

## 2014-09-21 MED ORDER — SODIUM CHLORIDE 0.9 % IV SOLN
INTRAVENOUS | Status: DC
  Administered 2014-09-21 – 2014-09-22 (×2): via INTRAVENOUS
  Administered 2014-09-22: 100 mL/h via INTRAVENOUS
  Administered 2014-09-23 – 2014-09-24 (×2): via INTRAVENOUS

## 2014-09-21 MED ORDER — PANTOPRAZOLE SODIUM 40 MG PO TBEC
80.0000 mg | DELAYED_RELEASE_TABLET | Freq: Every day | ORAL | Status: DC
  Administered 2014-09-22: 80 mg via ORAL
  Filled 2014-09-21 (×2): qty 2

## 2014-09-21 MED ORDER — SODIUM BICARBONATE 650 MG PO TABS
650.0000 mg | ORAL_TABLET | Freq: Two times a day (BID) | ORAL | Status: DC
  Administered 2014-09-21 – 2014-09-26 (×10): 650 mg via ORAL
  Filled 2014-09-21 (×13): qty 1

## 2014-09-21 MED ORDER — SODIUM CHLORIDE 0.9 % IJ SOLN
3.0000 mL | Freq: Two times a day (BID) | INTRAMUSCULAR | Status: DC
  Administered 2014-09-21 – 2014-09-26 (×8): 3 mL via INTRAVENOUS

## 2014-09-21 MED ORDER — ALLOPURINOL 300 MG PO TABS
300.0000 mg | ORAL_TABLET | Freq: Every day | ORAL | Status: DC
  Administered 2014-09-22 – 2014-09-26 (×5): 300 mg via ORAL
  Filled 2014-09-21 (×6): qty 1

## 2014-09-21 MED ORDER — SODIUM CHLORIDE 0.9 % IV BOLUS (SEPSIS)
500.0000 mL | Freq: Once | INTRAVENOUS | Status: AC
  Administered 2014-09-21: 500 mL via INTRAVENOUS

## 2014-09-21 NOTE — ED Provider Notes (Addendum)
CSN: [id number]     Arrival date & time 09/21/14  1520 History   First MD Initiated Contact with Patient 09/21/14 1648     Chief Complaint  Patient presents with  . Rectal Bleeding     (Consider location/radiation/quality/duration/timing/severity/associated sxs/prior Treatment) HPI Comments: Patient presents to the emergency  For evaluation of rectal bleeding. Patient reports that symptoms began 2 days ago. He reports that early in the day he had a normal bowel movement. The second bowel movement of the day was loosened and he had a third bowel movement that was bright red blood. Since then he has had multiple episodes of bright red blood per rectum. He has not had any abdominal pain associated with symptoms. There is no fever. Today, however, patient has been feeling weak and dizzy, especially with ambulating. He was noted to be hypotensive and mildly tachycardic in triage. He reports that he had a doctor's visit yesterday and his vital signs were normal, including blood pressure.  Patient is a 59 y.o. male presenting with hematochezia.  Rectal Bleeding Associated symptoms: dizziness     Past Medical History  Diagnosis Date  . Hypertension   . Gout   . Hepatitis     30 years ago  . Arthritis     OA  . Chronic kidney disease     ACUTE RENAL FAILURE 12/2012 STATUS POST RT ARM AV FISTULA - HAS NOT STARTED DIALYSIS; LEFT RENAL MASS  . Pneumonia     HOSPITALIZED 12/2012 LEGIONLLA PNEUMONIA  . Cancer renal lt nephrectomy   Past Surgical History  Procedure Laterality Date  . Hernia repair    . Bascilic vein transposition Right 01/18/2013    Procedure: BASCILIC VEIN TRANSPOSITION;  Surgeon: Elam Dutch, MD;  Location: [facility];  Service: Vascular;  Laterality: Right;  . Bascilic vein transposition Right 04/27/2013    Procedure: BASCILIC VEIN TRANSPOSITION AND REVISION;  Surgeon: Elam Dutch, MD;  Location: [facility];  Service: Vascular;  Laterality: Right;  . Laparoscopic  nephrectomy Left 05/28/2013    Procedure: LEFT LAPAROSCOPIC  RADICAL NEPHRECTOMY;  Surgeon: Ardis Hughs, MD;  Location: [facility];  Service: Urology;  Laterality: Left;   Family History  Problem Relation Age of Onset  . Hypertension Mother   . Cancer Father   . Hypertension Sister    History  Substance Use Topics  . Smoking status: Former Research scientist (life sciences)  . Smokeless tobacco: Never Used  . Alcohol Use: Yes     Comment: ocassionally  QUIT SMOKING IN 2014    Review of Systems  Constitutional: Positive for fatigue.  Respiratory: Negative for shortness of breath.   Cardiovascular: Negative for chest pain.  Gastrointestinal: Positive for blood in stool, hematochezia and anal bleeding.  Neurological: Positive for dizziness.  All other systems reviewed and are negative.     Allergies  Review of patient's allergies indicates no known allergies.  Home Medications   Prior to Admission medications   Medication Sig Start Date End Date Taking? Authorizing Provider  allopurinol (ZYLOPRIM) 300 MG tablet Take 300 mg by mouth daily.   Yes Historical Provider, MD  calcium acetate (PHOSLO) 667 MG capsule Take 1,334 mg by mouth 3 (three) times daily with meals. 01/25/13  Yes Adeline Saralyn Pilar, MD  cholecalciferol (VITAMIN D) 1000 UNITS tablet Take 1,000 Units by mouth daily.   Yes Historical Provider, MD  cloNIDine (CATAPRES) 0.2 MG tablet Take 0.2 mg by mouth 3 (three) times daily. 01/25/13  Yes Adeline C Viyuoh,  MD  hydrALAZINE (APRESOLINE) 25 MG tablet Take 25 mg by mouth 3 (three) times daily.    Yes Historical Provider, MD  hydrALAZINE (APRESOLINE) 50 MG tablet Take 50 mg by mouth 3 (three) times daily. Patient states he takes a 25mg  TID as well per patient   Yes Historical Provider, MD  omeprazole (PRILOSEC) 40 MG capsule Take 40 mg by mouth every morning.  01/25/13  Yes Adeline Saralyn Pilar, MD  predniSONE (DELTASONE) 10 MG tablet Take 10 mg by mouth daily with breakfast.   Yes Historical Provider,  MD  sodium bicarbonate 650 MG tablet Take 650 mg by mouth 2 (two) times daily. 01/25/13  Yes Adeline Saralyn Pilar, MD  docusate sodium (COLACE) 100 MG capsule Take 100 mg by mouth 2 (two) times daily.    Historical Provider, MD  furosemide (LASIX) 40 MG tablet Take 40 mg by mouth daily.    Historical Provider, MD   BP 93/72 mmHg  Pulse 101  Temp(Src) 98.2 F (36.8 C) (Oral)  Resp 20  Ht 6\' 3"  (1.905 m)  Wt 216 lb (97.977 kg)  BMI 27.00 kg/m2  SpO2 100% Physical Exam  Constitutional: He is oriented to person, place, and time. He appears well-developed and well-nourished. No distress.  HENT:  Head: Normocephalic and atraumatic.  Right Ear: Hearing normal.  Left Ear: Hearing normal.  Nose: Nose normal.  Mouth/Throat: Oropharynx is clear and moist and mucous membranes are normal.  Eyes: Conjunctivae and EOM are normal. Pupils are equal, round, and reactive to light.  Neck: Normal range of motion. Neck supple.  Cardiovascular: Regular rhythm, S1 normal and S2 normal.  Exam reveals no gallop and no friction rub.   No murmur heard. Pulmonary/Chest: Effort normal and breath sounds normal. No respiratory distress. He exhibits no tenderness.  Abdominal: Soft. Normal appearance and bowel sounds are normal. There is no hepatosplenomegaly. There is no tenderness. There is no rebound, no guarding, no tenderness at McBurney's point and negative Murphy's sign. No hernia.  Genitourinary: Rectum normal.  Rectal exam revealed gross maroon blood  Musculoskeletal: Normal range of motion.  Neurological: He is alert and oriented to person, place, and time. He has normal strength. No cranial nerve deficit or sensory deficit. Coordination normal. GCS eye subscore is 4. GCS verbal subscore is 5. GCS motor subscore is 6.  Skin: Skin is warm, dry and intact. No rash noted. No cyanosis.  Psychiatric: He has a normal mood and affect. His speech is normal and behavior is normal. Thought content normal.  Nursing note  and vitals reviewed.   ED Course  Procedures (including critical care time) Labs Review Labs Reviewed  COMPREHENSIVE METABOLIC PANEL - Abnormal; Notable for the following:    Glucose, Bld 113 (*)    BUN 51 (*)    Creatinine, Ser 3.80 (*)    Albumin 3.1 (*)    AST 14 (*)    ALT 9 (*)    GFR calc non Af Amer 16 (*)    GFR calc Af Amer 18 (*)    All other components within normal limits  CBC - Abnormal; Notable for the following:    RBC 3.18 (*)    Hemoglobin 8.4 (*)    HCT 27.1 (*)    RDW 17.8 (*)    All other components within normal limits  PROTIME-INR  I-STAT CG4 LACTIC ACID, ED  POC OCCULT BLOOD, ED  TYPE AND SCREEN  ABO/RH    Imaging Review Dg Chest 2 View  09/20/2014   CLINICAL DATA:  History of left-sided renal cell adenocarcinoma. Hypertension.  EXAM: CHEST  2 VIEW  COMPARISON:  March 24, 2014  FINDINGS: Lungs are clear. Heart size and pulmonary vascularity are normal. No adenopathy. There are no blastic or lytic bone lesions. There is degenerative change in the thoracic spine.  IMPRESSION: No neoplastic focus appreciable.  Lungs clear.   Electronically Signed   By: Lowella Grip III M.D.   On: 09/20/2014 11:11     EKG Interpretation   Date/Time:  Wednesday September 21 2014 17:30:23 EDT Ventricular Rate:  87 PR Interval:  163 QRS Duration: 95 QT Interval:  367 QTC Calculation: 441 R Axis:   32 Text Interpretation:  Sinus rhythm Borderline T wave abnormalities No  significant change since last tracing Confirmed by Bethany Hirt  MD,  Earsel Shouse [phone]) on 09/21/2014 5:34:56 PM      MDM   Final diagnoses:  Lower GI bleed   Patient presents to the emergency department for evaluation of rectal bleeding. Patient has had 2 days of red blood per rectum. This is painless, has a completely benign abdominal exam. Patient has become weak and dizzy today, was mildly tachycardic and slightly hypotensive initially. He has responded to IV fluids. Hemoglobin is 8.4, well  below his baseline. He will require hospitalization for further observation of hemoglobin and hematocrit, GI consultation.  Discussed briefly with Dr. Fuller Plan, on-call for gastroenterology. Patient does not require any interventions tonight. He will be seen in the morning.  Orpah Greek, MD 09/21/14 [place], MD 09/21/14 [phone]

## 2014-09-21 NOTE — ED Notes (Signed)
Pt reports bright red blood in stools since Monday night, reports moderate amount of blood x 3 today. bp 97/68 at triage, pt feeling lightheaded and weak today.

## 2014-09-21 NOTE — H&P (Signed)
[facility] History and Physical  Brian Lawson [id number] DOB: December 01, 1955 DOA: 09/21/2014  Referring physician: EDP PCP: Brian Peaches, MD   Chief Complaint: BRBPR   HPI: Brian Lawson is a 59 y.o. male who presents to the ED with c/o 2 day history of BRBPR.  Initially constipated he is now just passing numerous episodes of blood.  No melena, no pain or abdominal symptoms.  Today started feeling weak and dizzy especially with ambulating.  Came to ED, hypotensive and mildly tachycardic in triage, vitals improved with IVF.  Review of Systems: Systems reviewed.  As above, otherwise negative  Past Medical History  Diagnosis Date  . Hypertension   . Gout   . Hepatitis     30 years ago  . Arthritis     OA  . Chronic kidney disease     ACUTE RENAL FAILURE 12/2012 STATUS POST RT ARM AV FISTULA - HAS NOT STARTED DIALYSIS; LEFT RENAL MASS  . Pneumonia     HOSPITALIZED 12/2012 LEGIONLLA PNEUMONIA  . Cancer renal lt nephrectomy   Past Surgical History  Procedure Laterality Date  . Hernia repair    . Bascilic vein transposition Right 01/18/2013    Procedure: BASCILIC VEIN TRANSPOSITION;  Surgeon: Elam Dutch, MD;  Location: [place];  Service: Vascular;  Laterality: Right;  . Bascilic vein transposition Right 04/27/2013    Procedure: BASCILIC VEIN TRANSPOSITION AND REVISION;  Surgeon: Elam Dutch, MD;  Location: [place];  Service: Vascular;  Laterality: Right;  . Laparoscopic nephrectomy Left 05/28/2013    Procedure: LEFT LAPAROSCOPIC  RADICAL NEPHRECTOMY;  Surgeon: Ardis Hughs, MD;  Location: [facility];  Service: Urology;  Laterality: Left;   Social History:  reports that he has quit smoking. He has never used smokeless tobacco. He reports that he drinks alcohol. He reports that he does not use illicit drugs.  No Known Allergies  Family History  Problem Relation Age of Onset  . Hypertension Mother   . Cancer Father   . Hypertension Sister      Prior to  Admission medications   Medication Sig Start Date End Date Taking? Authorizing Provider  allopurinol (ZYLOPRIM) 300 MG tablet Take 300 mg by mouth daily.   Yes Historical Provider, MD  calcium acetate (PHOSLO) 667 MG capsule Take 1,334 mg by mouth 3 (three) times daily with meals. 01/25/13  Yes Adeline Saralyn Pilar, MD  cholecalciferol (VITAMIN D) 1000 UNITS tablet Take 1,000 Units by mouth daily.   Yes Historical Provider, MD  cloNIDine (CATAPRES) 0.2 MG tablet Take 0.2 mg by mouth 3 (three) times daily. 01/25/13  Yes Adeline Saralyn Pilar, MD  hydrALAZINE (APRESOLINE) 25 MG tablet Take 25 mg by mouth 3 (three) times daily.    Yes Historical Provider, MD  hydrALAZINE (APRESOLINE) 50 MG tablet Take 50 mg by mouth 3 (three) times daily. Patient states he takes a 25mg  TID as well per patient   Yes Historical Provider, MD  omeprazole (PRILOSEC) 40 MG capsule Take 40 mg by mouth every morning.  01/25/13  Yes Adeline Saralyn Pilar, MD  predniSONE (DELTASONE) 10 MG tablet Take 10 mg by mouth daily with breakfast.   Yes Historical Provider, MD  sodium bicarbonate 650 MG tablet Take 650 mg by mouth 2 (two) times daily. 01/25/13  Yes Adeline Saralyn Pilar, MD  docusate sodium (COLACE) 100 MG capsule Take 100 mg by mouth 2 (two) times daily.    Historical Provider, MD  furosemide (LASIX) 40 MG tablet Take 40  mg by mouth daily.    Historical Provider, MD   Physical Exam: Filed Vitals:   09/21/14 1915  BP: 127/84  Pulse: 99  Temp:   Resp: 11    BP 127/84 mmHg  Pulse 99  Temp(Src) 98.2 F (36.8 C) (Oral)  Resp 11  Ht 6\' 3"  (1.905 m)  Wt 97.977 kg (216 lb)  BMI 27.00 kg/m2  SpO2 100%  General Appearance:    Alert, oriented, no distress, appears stated age  Head:    Normocephalic, atraumatic  Eyes:    PERRL, EOMI, sclera non-icteric        Nose:   Nares without drainage or epistaxis. Mucosa, turbinates normal  Throat:   Moist mucous membranes. Oropharynx without erythema or exudate.  Neck:   Supple. No carotid  bruits.  No thyromegaly.  No lymphadenopathy.   Back:     No CVA tenderness, no spinal tenderness  Lungs:     Clear to auscultation bilaterally, without wheezes, rhonchi or rales  Chest wall:    No tenderness to palpitation  Heart:    Regular rate and rhythm without murmurs, gallops, rubs  Abdomen:     Soft, non-tender, nondistended, normal bowel sounds, no organomegaly  Genitalia:    deferred  Rectal:    deferred  Extremities:   No clubbing, cyanosis or edema.  Pulses:   2+ and symmetric all extremities  Skin:   Skin color, texture, turgor normal, no rashes or lesions  Lymph nodes:   Cervical, supraclavicular, and axillary nodes normal  Neurologic:   CNII-XII intact. Normal strength, sensation and reflexes      throughout    Labs on Admission:  Basic Metabolic Panel:  Recent Labs Lab 09/21/14 1614  NA 139  K 4.2  CL 104  CO2 26  GLUCOSE 113*  BUN 51*  CREATININE 3.80*  CALCIUM 9.3   Liver Function Tests:  Recent Labs Lab 09/21/14 1614  AST 14*  ALT 9*  ALKPHOS 42  BILITOT 0.4  PROT 6.5  ALBUMIN 3.1*   No results for input(s): LIPASE, AMYLASE in the last 168 hours. No results for input(s): AMMONIA in the last 168 hours. CBC:  Recent Labs Lab 09/21/14 1614  WBC 10.3  HGB 8.4*  HCT 27.1*  MCV 85.2  PLT 238   Cardiac Enzymes: No results for input(s): CKTOTAL, CKMB, CKMBINDEX, TROPONINI in the last 168 hours.  BNP (last 3 results)  Recent Labs  12/29/13 2033  PROBNP 90.8   CBG: No results for input(s): GLUCAP in the last 168 hours.  Radiological Exams on Admission: Dg Chest 2 View  09/20/2014   CLINICAL DATA:  History of left-sided renal cell adenocarcinoma. Hypertension.  EXAM: CHEST  2 VIEW  COMPARISON:  March 24, 2014  FINDINGS: Lungs are clear. Heart size and pulmonary vascularity are normal. No adenopathy. There are no blastic or lytic bone lesions. There is degenerative change in the thoracic spine.  IMPRESSION: No neoplastic focus  appreciable.  Lungs clear.   Electronically Signed   By: Lowella Grip III M.D.   On: 09/20/2014 11:11    EKG: Independently reviewed.  Assessment/Plan Principal Problem:   BRBPR (bright red blood per rectum) Active Problems:   Essential hypertension   CKD (chronic kidney disease) stage 4, GFR 15-29 ml/min   Lower GI bleed   1. BRBPR - 1. DDX includes diverticular bleed, AVM, other 2. [place] consulted by EDP and will see patient in AM 3. GI bleed likely lower given  history 4. Gentle hydration with IVF 5. Tele monitor 6. SCDs for DVT ppx 2. Acute blood loss anemia - 1. Repeat HGB at midnight and CBC in AM 2. Transfuse if needed 3. CKD stage 4 - with solitary kidney, 1. chronic and stable 2. repeat BMP in AM 3. holding lasix and BP meds due to GIB    Code Status: Full  Family Communication: Family at bedside Disposition Plan: Admit to inpatient   Time spent: 70 min  GARDNER, JARED M. [facility] Pager [phone]  If 7AM-7PM, please contact the day team taking care of the patient [email or web address] Password [facility] 09/21/2014, 7:36 PM

## 2014-09-22 DIAGNOSIS — D62 Acute posthemorrhagic anemia: Secondary | ICD-10-CM

## 2014-09-22 DIAGNOSIS — K625 Hemorrhage of anus and rectum: Secondary | ICD-10-CM

## 2014-09-22 DIAGNOSIS — K922 Gastrointestinal hemorrhage, unspecified: Secondary | ICD-10-CM

## 2014-09-22 LAB — BASIC METABOLIC PANEL
Anion gap: 7 (ref 5–15)
BUN: 50 mg/dL — ABNORMAL HIGH (ref 6–20)
CO2: 25 mmol/L (ref 22–32)
Calcium: 8.1 mg/dL — ABNORMAL LOW (ref 8.9–10.3)
Chloride: 106 mmol/L (ref 101–111)
Creatinine, Ser: 3.94 mg/dL — ABNORMAL HIGH (ref 0.61–1.24)
GFR calc Af Amer: 18 mL/min — ABNORMAL LOW (ref >60)
GFR calc non Af Amer: 15 mL/min — ABNORMAL LOW (ref >60)
Glucose, Bld: 113 mg/dL — ABNORMAL HIGH (ref 65–99)
Potassium: 4.8 mmol/L (ref 3.5–5.1)
Sodium: 138 mmol/L (ref 135–145)

## 2014-09-22 LAB — CBC
HCT: 23.2 % — ABNORMAL LOW (ref 39.0–52.0)
Hemoglobin: 7.3 g/dL — ABNORMAL LOW (ref 13.0–17.0)
MCH: 27 pg (ref 26.0–34.0)
MCHC: 31.5 g/dL (ref 30.0–36.0)
MCV: 85.9 fL (ref 78.0–100.0)
Platelets: 181 10*3/uL (ref 150–400)
RBC: 2.7 MIL/uL — ABNORMAL LOW (ref 4.22–5.81)
RDW: 17.5 % — ABNORMAL HIGH (ref 11.5–15.5)
WBC: 9.5 10*3/uL (ref 4.0–10.5)

## 2014-09-22 LAB — HEMOGLOBIN AND HEMATOCRIT, BLOOD
HEMATOCRIT: 20.1 % — AB (ref 39.0–52.0)
HEMOGLOBIN: 6.3 g/dL — AB (ref 13.0–17.0)

## 2014-09-22 LAB — PREPARE RBC (CROSSMATCH)

## 2014-09-22 LAB — ABO/RH: ABO/RH(D): A POS

## 2014-09-22 MED ORDER — IOHEXOL 300 MG/ML  SOLN: 25.0000 mL | INTRAMUSCULAR | Status: AC

## 2014-09-22 MED ORDER — PEG-KCL-NACL-NASULF-NA ASC-C 100 G PO SOLR: 1.0000 | Freq: Once | ORAL | Status: DC

## 2014-09-22 MED ORDER — SODIUM CHLORIDE 0.9 % IV SOLN: Freq: Once | INTRAVENOUS | Status: DC

## 2014-09-22 MED ORDER — SODIUM CHLORIDE 0.9 % IV SOLN
Freq: Once | INTRAVENOUS | Status: AC
  Administered 2014-09-22: 10 mL/h via INTRAVENOUS

## 2014-09-22 MED ORDER — PEG 3350-KCL-NA BICARB-NACL 420 G PO SOLR
2000.0000 mL | Freq: Once | ORAL | Status: DC
Start: 2014-09-23 — End: 2014-09-24

## 2014-09-22 MED ORDER — PEG 3350-KCL-NA BICARB-NACL 420 G PO SOLR
2000.0000 mL | Freq: Once | ORAL | Status: DC
  Filled 2014-09-22: qty 4000

## 2014-09-22 NOTE — Progress Notes (Signed)
New Admission Note:  Arrival Method: Via stretcher  Mental Orientation: Alert & orientedx4 Telemetry: On box 9 Assessment: Completed Skin: Dry and flaky on both feet IV: Left AC, normal saline infusing at 100 ml/hr Pain: Denies any pain Tubes: n/a Admission: Completed [facility] [place] Orientation: Patient has been orientated to the room, unit and the staff. Family: None at bedside  Orders have been reviewed and implemented. Will continue to monitor the patient. Call light has been placed within reach and bed alarm has been activated.   Leandro Reasoner BSN, RN  Phone Number: [phone] [facility]

## 2014-09-22 NOTE — Progress Notes (Signed)
1 unit of PRBC  To be transfused per NP's order.

## 2014-09-22 NOTE — Progress Notes (Signed)
PROGRESS NOTE  Brian Lawson [id number] DOB: 28-Feb-1956 DOA: 09/21/2014 PCP: Criselda Peaches, MD  HPI/Recap of past 74 hours: 59 year old male with past history of renal cell carcinoma, chronic kidney disease stage IV admitted after several episodes of bright red blood per rectum. Despite chronic renal disease, hemoglobin baseline around 12. On admission, hemoglobin at 8.4. Brought into hospitalist service and GI consulted.  That night, hemoglobin dropped to 7.3. She transfused 2 units packed red blood cells. This morning, no further bleeding. No abdominal pain. Patient seen by GI who plan for colonoscopy on 7/15  Assessment/Plan: Principal Problem:   BRBPR (bright red blood per rectum) causing acute blood loss anemia: Suspected diverticular bleed. For colonoscopy 7/15. Patient has never had a colonoscopy before. Active Problems:   Essential hypertension: Blood pressure stable   CKD (chronic kidney disease) stage 4, GFR 15-29 ml/min: Kidney disease stable  Renal cell carcinoma: Patient due for follow-up CT for screening  Overweight: Patient meets criteria with BMI greater than 25  Code Status: Full code  Family Communication: Patient declined for me to call family  Disposition Plan: Pending on results of colonoscopy, could be discharged as early as tomorrow afternoon   Consultants:  Gastroenterology  Procedures:  Planned colonoscopy 7/15  Antibiotics:  None`   Objective: BP 116/70 mmHg  Pulse 91  Temp(Src) 98.6 F (37 C) (Oral)  Resp 16  Ht 6\' 3"  (1.905 m)  Wt 96.934 kg (213 lb 11.2 oz)  BMI 26.71 kg/m2  SpO2 100%  Intake/Output Summary (Last 24 hours) at 09/22/14 1404 Last data filed at 09/22/14 0900  Gross per 24 hour  Intake 1368.33 ml  Output    525 ml  Net 843.33 ml   Filed Weights   09/21/14 1605 09/21/14 2001  Weight: 97.977 kg (216 lb) 96.934 kg (213 lb 11.2 oz)    Exam:   General:  Alert and oriented 3, no acute  distress  Cardiovascular: Regular rate and rhythm, [id number], 2/6 systolic ejection murmur  Respiratory: Clear to auscultation bilaterally  Abdomen: Soft, nontender, nondistended, positive bowel sounds  Musculoskeletal: No clubbing or cyanosis or edema   Data Reviewed: Basic Metabolic Panel:  Recent Labs Lab 09/21/14 1614 09/22/14 0536  NA 139 138  K 4.2 4.8  CL 104 106  CO2 26 25  GLUCOSE 113* 113*  BUN 51* 50*  CREATININE 3.80* 3.94*  CALCIUM 9.3 8.1*   Liver Function Tests:  Recent Labs Lab 09/21/14 1614  AST 14*  ALT 9*  ALKPHOS 42  BILITOT 0.4  PROT 6.5  ALBUMIN 3.1*   No results for input(s): LIPASE, AMYLASE in the last 168 hours. No results for input(s): AMMONIA in the last 168 hours. CBC:  Recent Labs Lab 09/21/14 1614 09/22/14 0026 09/22/14 0536  WBC 10.3  --  9.5  HGB 8.4* 6.3* 7.3*  HCT 27.1* 20.1* 23.2*  MCV 85.2  --  85.9  PLT 238  --  181   Cardiac Enzymes:   No results for input(s): CKTOTAL, CKMB, CKMBINDEX, TROPONINI in the last 168 hours. BNP (last 3 results) No results for input(s): BNP in the last 8760 hours.  ProBNP (last 3 results)  Recent Labs  12/29/13 2033  PROBNP 90.8    CBG: No results for input(s): GLUCAP in the last 168 hours.  No results found for this or any previous visit (from the past 240 hour(s)).   Studies: No results found.  Scheduled Meds: . sodium chloride   Intravenous Once  .  allopurinol  300 mg Oral Daily  . calcium acetate  1,334 mg Oral TID WC  . pantoprazole  80 mg Oral Daily  . polyethylene glycol-electrolytes  2,000 mL Oral Once   Followed by  . [START ON 09/23/2014] polyethylene glycol-electrolytes  2,000 mL Oral Once  . predniSONE  10 mg Oral Q breakfast  . sodium bicarbonate  650 mg Oral BID  . sodium chloride  3 mL Intravenous Q12H    Continuous Infusions: . sodium chloride 100 mL/hr at 09/22/14 1100     Time spent: 25 minutes  [facility] Pager  [phone]. If 7PM-7AM, please contact night-coverage at [email or web address], password [facility] 09/22/2014, 2:04 PM  LOS: 1 day

## 2014-09-22 NOTE — Progress Notes (Signed)
CRITICAL VALUE ALERT  Critical value received:  Hemoglobin=6.3  Date of notification:  09/22/14  Time of notification:  01:20  Critical value read back:Yes.    Nurse who received alert:  Leandro Reasoner  MD notified (1st page):  K. Schorr  Time of first page:  01:24  MD notified (2nd page):  Time of second page:  Responding MD:  Lamar Blinks  Time MD responded:  01:31

## 2014-09-22 NOTE — Progress Notes (Signed)
Pt is scheduled for colonoscopy tomorrow, bowel prep (Golytely) was started at shift change. Pt has also an order for CT of the abdomen and pelvis with oral contrast. Pt is refusing any kind of contrast. On call K. Schorr notified and made aware about it, and said to have the CT done tomorrow. Radiology staff [facility] made aware. Oral contrast discarded per CT staff's advise.

## 2014-09-22 NOTE — Consult Note (Signed)
Referring Provider: No ref. provider found Primary Care Physician:  Criselda Peaches, MD Primary Gastroenterologist:  None, unassigned.  Had been given referral to Dr. Fuller Plan by PCP recently  Reason for Consultation:  GIB  HPI: Brian Lawson is a 59 y.o. male with PMH of HTN, CKD, s/p left nephrectomy for malignancy.  Presented to [facility] ED with c/o 2 day history of BRBPR. He says that he has been having constipation for the past year or so.  Monday he had a very hard stool and then he began passing blood.  Blood is described as bright red.  Had a couple of episodes yesterday where he passed only blood but no stool. No black stools, abdominal pain, etc.  He says that his appetite is good overall but has lost about 5-10 pounds recently.  He was mildly hypotensive and mildly tachycardic in triage when he came to the ED, vitals improved with IVF.  He is on clear liquids and has not moved his bowels or passed any blood since admission yesterday afternoon.  Hgb was 8.4 grams when he first came in but dropped to 6.3 grams so he was transfused with one unit PRBC's.  This AM it has increased to 7.3 grams.  It looks like his Hgb is always somewhat low in the 11-12 gram range; MCV is normal.  Never had colonoscopy in the past but had a referral to see Dr. Fuller Plan soon to schedule one, but had not done so yet.  Denies any family history of colon cancer.  Had only been taking 2 colace a day for his constipation and it has not been helping.   Past Medical History  Diagnosis Date  . Hypertension   . Gout   . Hepatitis     30 years ago  . Arthritis     OA  . Chronic kidney disease     ACUTE RENAL FAILURE 12/2012 STATUS POST RT ARM AV FISTULA - HAS NOT STARTED DIALYSIS; LEFT RENAL MASS  . Pneumonia     HOSPITALIZED 12/2012 LEGIONLLA PNEUMONIA  . Cancer renal lt nephrectomy    Past Surgical History  Procedure Laterality Date  . Hernia repair    . Bascilic vein transposition Right 01/18/2013    Procedure:  BASCILIC VEIN TRANSPOSITION;  Surgeon: Elam Dutch, MD;  Location: [place];  Service: Vascular;  Laterality: Right;  . Bascilic vein transposition Right 04/27/2013    Procedure: BASCILIC VEIN TRANSPOSITION AND REVISION;  Surgeon: Elam Dutch, MD;  Location: [place];  Service: Vascular;  Laterality: Right;  . Laparoscopic nephrectomy Left 05/28/2013    Procedure: LEFT LAPAROSCOPIC  RADICAL NEPHRECTOMY;  Surgeon: Ardis Hughs, MD;  Location: [facility];  Service: Urology;  Laterality: Left;    Prior to Admission medications   Medication Sig Start Date End Date Taking? Authorizing Provider  allopurinol (ZYLOPRIM) 300 MG tablet Take 300 mg by mouth daily.   Yes Historical Provider, MD  calcium acetate (PHOSLO) 667 MG capsule Take 1,334 mg by mouth 3 (three) times daily with meals. 01/25/13  Yes Adeline Saralyn Pilar, MD  cholecalciferol (VITAMIN D) 1000 UNITS tablet Take 1,000 Units by mouth daily.   Yes Historical Provider, MD  cloNIDine (CATAPRES) 0.2 MG tablet Take 0.2 mg by mouth 3 (three) times daily. 01/25/13  Yes Adeline Saralyn Pilar, MD  hydrALAZINE (APRESOLINE) 25 MG tablet Take 25 mg by mouth 3 (three) times daily.    Yes Historical Provider, MD  hydrALAZINE (APRESOLINE) 50 MG tablet Take  50 mg by mouth 3 (three) times daily. Patient states he takes a 25mg  TID as well per patient   Yes Historical Provider, MD  omeprazole (PRILOSEC) 40 MG capsule Take 40 mg by mouth every morning.  01/25/13  Yes Adeline Saralyn Pilar, MD  predniSONE (DELTASONE) 10 MG tablet Take 10 mg by mouth daily with breakfast.   Yes Historical Provider, MD  sodium bicarbonate 650 MG tablet Take 650 mg by mouth 2 (two) times daily. 01/25/13  Yes Adeline Saralyn Pilar, MD  docusate sodium (COLACE) 100 MG capsule Take 100 mg by mouth 2 (two) times daily.    Historical Provider, MD  furosemide (LASIX) 40 MG tablet Take 40 mg by mouth daily.    Historical Provider, MD    Current Facility-Administered Medications  Medication Dose Route  Frequency Provider Last Rate Last Dose  . 0.9 %  sodium chloride infusion   Intravenous Continuous Etta Quill, DO 100 mL/hr at 09/21/14 2110    . 0.9 %  sodium chloride infusion   Intravenous Once Jeryl Columbia, NP      . allopurinol (ZYLOPRIM) tablet 300 mg  300 mg Oral Daily Etta Quill, DO      . calcium acetate (PHOSLO) capsule 1,334 mg  1,334 mg Oral TID WC Etta Quill, DO   1,334 mg at 09/22/14 0839  . pantoprazole (PROTONIX) EC tablet 80 mg  80 mg Oral Daily Etta Quill, DO      . predniSONE (DELTASONE) tablet 10 mg  10 mg Oral Q breakfast Etta Quill, DO   10 mg at 09/22/14 0839  . sodium bicarbonate tablet 650 mg  650 mg Oral BID Etta Quill, DO   650 mg at 09/21/14 2115  . sodium chloride 0.9 % injection 3 mL  3 mL Intravenous Q12H Etta Quill, DO   3 mL at 09/21/14 2110    Allergies as of 09/21/2014  . (No Known Allergies)    Family History  Problem Relation Age of Onset  . Hypertension Mother   . Cancer Father   . Hypertension Sister     History   Social History  . Marital Status: Single    Spouse Name: N/A  . Number of Children: N/A  . Years of Education: N/A   Occupational History  . Not on file.   Social History Main Topics  . Smoking status: Former Research scientist (life sciences)  . Smokeless tobacco: Never Used  . Alcohol Use: Yes     Comment: ocassionally  QUIT SMOKING IN 2014  . Drug Use: No  . Sexual Activity: Not on file   Other Topics Concern  . Not on file   Social History Narrative    Review of Systems: Ten point ROS is O/W negative except as mentioned in HPI.  Physical Exam: Vital signs in last 24 hours: Temp:  [98.2 F (36.8 C)-99 F (37.2 C)] 98.6 F (37 C) (07/14 0901) Pulse Rate:  [89-114] 91 (07/14 0901) Resp:  [11-20] 16 (07/14 0901) BP: (93-127)/(66-84) 116/70 mmHg (07/14 0901) SpO2:  [100 %] 100 % (07/14 0901) Weight:  [213 lb 11.2 oz (96.934 kg)-216 lb (97.977 kg)] 213 lb 11.2 oz (96.934 kg) (07/13 2001) Last BM  Date: 09/28/14 General:  Alert, Well-developed, well-nourished, pleasant and cooperative in NAD Head:  Normocephalic and atraumatic. Eyes:  Sclera clear, no icterus.  Conjunctiva pink. Ears:  Normal auditory acuity. Mouth:  No deformity or lesions.   Lungs:  Clear throughout to auscultation.  No wheezes, crackles, or rhonchi.  Heart:  Regular rate and rhythm; no murmurs, clicks, rubs, or gallops. Abdomen:  Soft, non-distended.  BS present.  Non-tender.  Rectal:  Deferred  Msk:  Symmetrical without gross deformities. Pulses:  Normal pulses noted. Extremities:  Without clubbing or edema. Neurologic:  Alert and  oriented x4;  grossly normal neurologically. Skin:  Intact without significant lesions or rashes. Psych:  Alert and cooperative. Normal mood and affect.  Intake/Output from previous day: 07/13 0701 - 07/14 0700 In: 1248.3 [I.V.:883.3; Blood:365] Out: 300 [Urine:300]  Lab Results:  Recent Labs  09/21/14 1614 09/22/14 0026 09/22/14 0536  WBC 10.3  --  9.5  HGB 8.4* 6.3* 7.3*  HCT 27.1* 20.1* 23.2*  PLT 238  --  181   BMET  Recent Labs  09/21/14 1614 09/22/14 0536  NA 139 138  K 4.2 4.8  CL 104 106  CO2 26 25  GLUCOSE 113* 113*  BUN 51* 50*  CREATININE 3.80* 3.94*  CALCIUM 9.3 8.1*   LFT  Recent Labs  09/21/14 1614  PROT 6.5  ALBUMIN 3.1*  AST 14*  ALT 9*  ALKPHOS 42  BILITOT 0.4   PT/INR  Recent Labs  09/21/14 1718  LABPROT 14.6  INR 1.12   Studies/Results: Dg Chest 2 View  09/20/2014   CLINICAL DATA:  History of left-sided renal cell adenocarcinoma. Hypertension.  EXAM: CHEST  2 VIEW  COMPARISON:  March 24, 2014  FINDINGS: Lungs are clear. Heart size and pulmonary vascularity are normal. No adenopathy. There are no blastic or lytic bone lesions. There is degenerative change in the thoracic spine.  IMPRESSION: No neoplastic focus appreciable.  Lungs clear.   Electronically Signed   By: Lowella Grip III M.D.   On: 09/20/2014 11:11     IMPRESSION:  -LGIB:  Sounds most likely diverticular in origin (mild diverticulosis was noted on previous CT scan).  No further bleeding since admission. -Acute on chronic anemia:  Normocytic.  This chronically may be due to his CKD but acutely from GI bleed.  S/p transfusion with one unit PRBC's. -Chronic constipation:  Taking only colace at home. -CKD, stage 4  PLAN: -Monitor Hgb and transfuse further prn. -Will plan for colonoscopy tomorrow, 7/15. -Will need something more for his constipation.  Would try Miralax daily upon discharge.  Kenden Brandt D.  09/22/2014, 9:30 AM  Pager number [phone]

## 2014-09-23 ENCOUNTER — Inpatient Hospital Stay (HOSPITAL_COMMUNITY): Payer: Medicaid Other | Admitting: Certified Registered"

## 2014-09-23 ENCOUNTER — Encounter (HOSPITAL_COMMUNITY): Payer: Self-pay | Admitting: *Deleted

## 2014-09-23 ENCOUNTER — Inpatient Hospital Stay (HOSPITAL_COMMUNITY): Payer: Medicaid Other

## 2014-09-23 ENCOUNTER — Encounter (HOSPITAL_COMMUNITY)
Admission: EM | Disposition: A | Discharge status: Home / Self Care | Payer: Self-pay | Source: Home / Self Care | Attending: Internal Medicine

## 2014-09-23 HISTORY — PX: COLONOSCOPY: SHX5424

## 2014-09-23 LAB — CBC
HCT: 19.5 % — ABNORMAL LOW (ref 39.0–52.0)
HEMOGLOBIN: 6.2 g/dL — AB (ref 13.0–17.0)
MCH: 27.6 pg (ref 26.0–34.0)
MCHC: 31.8 g/dL (ref 30.0–36.0)
MCV: 86.7 fL (ref 78.0–100.0)
PLATELETS: 174 10*3/uL (ref 150–400)
RBC: 2.25 MIL/uL — ABNORMAL LOW (ref 4.22–5.81)
RDW: 18.4 % — ABNORMAL HIGH (ref 11.5–15.5)
WBC: 10.7 10*3/uL — ABNORMAL HIGH (ref 4.0–10.5)

## 2014-09-23 LAB — BASIC METABOLIC PANEL
Anion gap: 7 (ref 5–15)
BUN: 41 mg/dL — AB (ref 6–20)
CO2: 22 mmol/L (ref 22–32)
Calcium: 8.5 mg/dL — ABNORMAL LOW (ref 8.9–10.3)
Chloride: 110 mmol/L (ref 101–111)
Creatinine, Ser: 3.38 mg/dL — ABNORMAL HIGH (ref 0.61–1.24)
GFR calc Af Amer: 21 mL/min — ABNORMAL LOW (ref >60)
GFR calc non Af Amer: 18 mL/min — ABNORMAL LOW (ref >60)
Glucose, Bld: 113 mg/dL — ABNORMAL HIGH (ref 65–99)
POTASSIUM: 4.2 mmol/L (ref 3.5–5.1)
SODIUM: 139 mmol/L (ref 135–145)

## 2014-09-23 LAB — PREPARE RBC (CROSSMATCH)

## 2014-09-23 LAB — GLUCOSE, CAPILLARY: Glucose-Capillary: 107 mg/dL — ABNORMAL HIGH (ref 65–99)

## 2014-09-23 SURGERY — COLONOSCOPY
Anesthesia: Monitor Anesthesia Care

## 2014-09-23 MED ORDER — PANTOPRAZOLE SODIUM 40 MG PO TBEC
40.0000 mg | DELAYED_RELEASE_TABLET | Freq: Every day | ORAL | Status: DC
  Administered 2014-09-24 – 2014-09-26 (×3): 40 mg via ORAL
  Filled 2014-09-23 (×2): qty 1

## 2014-09-23 MED ORDER — PROPOFOL 10 MG/ML IV BOLUS
INTRAVENOUS | Status: DC | PRN
  Administered 2014-09-23: 30 mg via INTRAVENOUS
  Administered 2014-09-23: 10 mg via INTRAVENOUS
  Administered 2014-09-23: 30 mg via INTRAVENOUS
  Administered 2014-09-23 (×2): 10 mg via INTRAVENOUS
  Administered 2014-09-23: 30 mg via INTRAVENOUS
  Administered 2014-09-23: 20 mg via INTRAVENOUS
  Administered 2014-09-23: 40 mg via INTRAVENOUS
  Administered 2014-09-23: 30 mg via INTRAVENOUS
  Administered 2014-09-23: 20 mg via INTRAVENOUS
  Administered 2014-09-23: 10 mg via INTRAVENOUS
  Administered 2014-09-23: 20 mg via INTRAVENOUS
  Administered 2014-09-23: 30 mg via INTRAVENOUS
  Administered 2014-09-23: 20 mg via INTRAVENOUS
  Administered 2014-09-23: 30 mg via INTRAVENOUS
  Administered 2014-09-23: 20 mg via INTRAVENOUS

## 2014-09-23 MED ORDER — SODIUM CHLORIDE 0.9 % IV SOLN
Freq: Once | INTRAVENOUS | Status: AC
  Administered 2014-09-23: 500 mL via INTRAVENOUS

## 2014-09-23 MED ORDER — LIDOCAINE HCL (CARDIAC) 20 MG/ML IV SOLN
INTRAVENOUS | Status: DC | PRN
  Administered 2014-09-23: 40 mg via INTRAVENOUS

## 2014-09-23 MED ORDER — SODIUM CHLORIDE 0.9 % IV SOLN: INTRAVENOUS | Status: DC

## 2014-09-23 NOTE — Progress Notes (Signed)
Patient tolerated prep although he did not drink the entire jug of GoLytely; says he was told that he did not need to finish it.  Passing only blood now without stool.  Feels nauseous and somewhat light-headed this AM.  Morning CBC still pending.

## 2014-09-23 NOTE — Anesthesia Postprocedure Evaluation (Signed)
  Anesthesia Post-op Note  Patient: Brian Lawson  Procedure(s) Performed: Procedure(s): COLONOSCOPY (N/A)  Patient Location: PACU  Anesthesia Type:MAC  Level of Consciousness: awake, alert , oriented and patient cooperative  Airway and Oxygen Therapy: Patient Spontanous Breathing  Post-op Pain: none  Post-op Assessment: Post-op Vital signs reviewed, Patient's Cardiovascular Status Stable, Respiratory Function Stable, Patent Airway, No signs of Nausea or vomiting and Pain level controlled              Post-op Vital Signs: stable  Last Vitals:  Filed Vitals:   09/23/14 1359  BP:   Pulse:   Temp: 36.7 C  Resp:     Complications: No apparent anesthesia complications

## 2014-09-23 NOTE — Progress Notes (Signed)
PROGRESS NOTE  Brian Lawson [id number] DOB: 1955-12-08 DOA: 09/21/2014 PCP: Criselda Peaches, MD  HPI/Recap of past 77 hours: 59 year old male with past history of renal cell carcinoma, chronic kidney disease stage IV admitted after several episodes of bright red blood per rectum. Despite chronic renal disease, hemoglobin baseline around 12. On admission, hemoglobin at 8.4. Brought into hospitalist service and GI consulted.  That night, hemoglobin dropped to 7.3. She transfused 2 units packed red blood cells. This morning, no further bleeding. No abdominal pain. Patient seen by GI who plan for colonoscopy on 7/15  Subjective: Feels weaker today. Still reports bloody stools  Assessment/Plan: Principal Problem:   BRBPR (bright red blood per rectum) causing acute blood loss anemia: Suspected diverticular bleed. Pending colonoscopy 7/15. Patient is colonoscopy naive. Hgb down to 6.3 today from over 7 yesterday. Will transfuse another 2 units PRBC's  Active Problems:   Essential hypertension: Blood pressure remains stable   CKD (chronic kidney disease) stage 4, GFR 15-29 ml/min: Kidney disease stable  Renal cell carcinoma: Patient due for follow-up CT for screening  Overweight: Patient meets criteria with BMI greater than 25  Code Status: Full code  Family Communication: Patient declined for me to call family  Disposition Plan: Pending stabilization of hgb and results of colonoscopy   Consultants:  Gastroenterology  Procedures:  Planned colonoscopy 7/15  Antibiotics:  None`   Objective: BP 148/81 mmHg  Pulse 107  Temp(Src) 98.2 F (36.8 C) (Oral)  Resp 16  Ht 6\' 3"  (1.905 m)  Wt 96.5 kg (212 lb 11.9 oz)  BMI 26.59 kg/m2  SpO2 100%  Intake/Output Summary (Last 24 hours) at 09/23/14 1157 Last data filed at 09/23/14 1155  Gross per 24 hour  Intake   4425 ml  Output   2375 ml  Net   2050 ml   Filed Weights   09/21/14 1605 09/21/14 2001 09/22/14 2100    Weight: 97.977 kg (216 lb) 96.934 kg (213 lb 11.2 oz) 96.5 kg (212 lb 11.9 oz)    Exam:   General:  Alert and oriented 3, no acute distress  Cardiovascular: Regular rate and rhythm, [id number], 2/6 systolic ejection murmur  Respiratory: Clear to auscultation bilaterally  Abdomen: Soft, nontender, nondistended, positive bowel sounds  Musculoskeletal: No clubbing or cyanosis or edema   Data Reviewed: Basic Metabolic Panel:  Recent Labs Lab 09/21/14 1614 09/22/14 0536 09/23/14 0413  NA 139 138 139  K 4.2 4.8 4.2  CL 104 106 110  CO2 26 25 22   GLUCOSE 113* 113* 113*  BUN 51* 50* 41*  CREATININE 3.80* 3.94* 3.38*  CALCIUM 9.3 8.1* 8.5*   Liver Function Tests:  Recent Labs Lab 09/21/14 1614  AST 14*  ALT 9*  ALKPHOS 42  BILITOT 0.4  PROT 6.5  ALBUMIN 3.1*   No results for input(s): LIPASE, AMYLASE in the last 168 hours. No results for input(s): AMMONIA in the last 168 hours. CBC:  Recent Labs Lab 09/21/14 1614 09/22/14 0026 09/22/14 0536 09/23/14 0915  WBC 10.3  --  9.5 10.7*  HGB 8.4* 6.3* 7.3* 6.2*  HCT 27.1* 20.1* 23.2* 19.5*  MCV 85.2  --  85.9 86.7  PLT 238  --  181 174   Cardiac Enzymes:   No results for input(s): CKTOTAL, CKMB, CKMBINDEX, TROPONINI in the last 168 hours. BNP (last 3 results) No results for input(s): BNP in the last 8760 hours.  ProBNP (last 3 results)  Recent Labs  12/29/13 2033  PROBNP 90.8  CBG:  Recent Labs Lab 09/23/14 0439  GLUCAP 107*    No results found for this or any previous visit (from the past 240 hour(s)).   Studies: No results found.  Scheduled Meds: . sodium chloride   Intravenous Once  . sodium chloride   Intravenous Once  . allopurinol  300 mg Oral Daily  . calcium acetate  1,334 mg Oral TID WC  . pantoprazole  80 mg Oral Daily  . polyethylene glycol-electrolytes  2,000 mL Oral Once   Followed by  . polyethylene glycol-electrolytes  2,000 mL Oral Once  . predniSONE  10 mg Oral Q  breakfast  . sodium bicarbonate  650 mg Oral BID  . sodium chloride  3 mL Intravenous Q12H    Continuous Infusions: . sodium chloride 100 mL/hr (09/22/14 2215)     Time spent: 25 minutes  Cerria Randhawa, [place] [facility] Pager [phone]. If 7PM-7AM, please contact night-coverage at [email or web address], password [facility] 09/23/2014, 11:57 AM  LOS: 2 days

## 2014-09-23 NOTE — Op Note (Signed)
[facility] [place], [id number]   COLONOSCOPY PROCEDURE REPORT  PATIENT: Brian Lawson, Brian Lawson  MR#: [id number] BIRTHDATE: July 03, 1955 , 59  yrs. old GENDER: male ENDOSCOPIST: Jerene Bears, MD REFERRED [id number] Hospitalist PROCEDURE DATE:  09/23/2014 PROCEDURE:   Colonoscopy, diagnostic First Screening Colonoscopy - Avg.  risk and is 50 yrs.  old or older - No.  Prior Negative Screening - Now for repeat screening. N/A  History of Adenoma - Now for follow-up colonoscopy & has been > or = to 3 yrs.  N/A  Polyps removed today? No ASA CLASS:   Class III INDICATIONS:hematochezia, painless; acute post-hemorrhagic anemia MEDICATIONS: Monitored anesthesia care and Per Anesthesia  DESCRIPTION OF PROCEDURE:   After the risks benefits and alternatives of the procedure were thoroughly explained, informed consent was obtained.  The digital rectal exam revealed no rectal mass.   The [employer] Adult Colon [id number]  endoscope was introduced through the anus and advanced to the terminal ileum which was intubated for a short distance. No adverse events experienced. The quality of the prep was fair.  (MoviPrep was used)  The instrument was then slowly withdrawn as the colon was fully examined. Estimated blood loss is zero unless otherwise noted in this procedure report.      COLON FINDINGS: The examined terminal ileum appeared to be normal. Blood was seen both fresh and old with clots throughout the colon. There was moderate diverticulosis noted in the ascending colon and at the hepatic flexure.  Copious irrigation and lavage performed and no active bleeding seen from diverticulosis. The examination was otherwise normal with no diverticulosis visible in the left colon. No polyps were seen though retained blood and small amounts of stool obscured visualization in some segments..  Retroflexed views revealed no abnormalities. The time to cecum = 6.9 Withdrawal time  = 19.9   The scope was withdrawn and the procedure completed.  COMPLICATIONS: There were no immediate complications.  ENDOSCOPIC IMPRESSION: 1.   The examined terminal ileum appeared to be normal 2.   Moderate diverticulosis was noted in the ascending colon and at the hepatic flexure without active bleeding; this is the presumed source of recent GI bleeding 3.   The examination was otherwise normal  RECOMMENDATIONS: 1.  Closely monitor Hgb, transfuse as needed 2.  For further hematochezia would recommend nuclear tagged RBC scan with angiography if positive  eSigned:  Jerene Bears, MD 09/23/2014 2:22 PM   cc:  the patient   PATIENT NAME:  Brian Lawson, Brian Lawson MR#: [id number]

## 2014-09-23 NOTE — Anesthesia Preprocedure Evaluation (Addendum)
Anesthesia Evaluation  Patient identified by MRN, date of birth, ID band Patient awake    Reviewed: Allergy & Precautions, NPO status , Patient's Chart, lab work & pertinent test results  Airway Mallampati: II  TM Distance: >3 FB Neck ROM: Full    Dental  (+) Teeth Intact, Missing, Dental Advisory Given   Pulmonary former smoker,    Pulmonary exam normal       Cardiovascular hypertension, Normal cardiovascular exam    Neuro/Psych    GI/Hepatic (+) Hepatitis -, B  Endo/Other    Renal/GU CRFRenal disease     Musculoskeletal  (+) Arthritis -,   Abdominal   Peds  Hematology  (+) anemia ,   Anesthesia Other Findings   Reproductive/Obstetrics                            Anesthesia Physical Anesthesia Plan  ASA: III  Anesthesia Plan: MAC   Post-op Pain Management:    Induction: Intravenous  Airway Management Planned: Mask  Additional Equipment:   Intra-op Plan:   Post-operative Plan:   Informed Consent: I have reviewed the patients History and Physical, chart, labs and discussed the procedure including the risks, benefits and alternatives for the proposed anesthesia with the patient or authorized representative who has indicated his/her understanding and acceptance.     Plan Discussed with: CRNA, Anesthesiologist and Surgeon  Anesthesia Plan Comments:         Anesthesia Quick Evaluation

## 2014-09-23 NOTE — Anesthesia Procedure Notes (Signed)
Procedure Name: MAC Date/Time: 09/23/2014 1:12 PM Performed by: Barrington Ellison Pre-anesthesia Checklist: Patient identified, Emergency Drugs available, Patient being monitored and Timeout performed Patient Re-evaluated:Patient Re-evaluated prior to inductionOxygen Delivery Method: Nasal cannula

## 2014-09-23 NOTE — Progress Notes (Signed)
Patient refused to drink oral contrast for his CT of his Abdomen and Pelvis.

## 2014-09-23 NOTE — Progress Notes (Signed)
As per blood bank,patient has had a blood transfusion 09/21/14,and we could still used the previous  type and screen order  for this new order 2 units of blood transfusion provided that the patient has still  And wearing that blue arm band with him.Blue blood arm band still wearing on patient's left arm as verified by this nurse thus we could d/c the new order of type and screen .M.D. made aware.

## 2014-09-23 NOTE — Transfer of Care (Signed)
Immediate Anesthesia Transfer of Care Note  Patient: Brian Lawson  Procedure(s) Performed: Procedure(s): COLONOSCOPY (N/A)  Patient Location: Endoscopy Unit  Anesthesia Type:MAC  Level of Consciousness: awake, alert  and oriented  Airway & Oxygen Therapy: Patient Spontanous Breathing  Post-op Assessment: Report given to RN  Post vital signs: Reviewed and stable  Last Vitals:  Filed Vitals:   09/23/14 1359  BP: 137/72  Pulse: 97  Temp: 36.7 C  Resp: 19    Complications: No apparent anesthesia complications

## 2014-09-24 LAB — TYPE AND SCREEN
ABO/RH(D): A POS
ANTIBODY SCREEN: NEGATIVE
Unit division: 0
Unit division: 0
Unit division: 0

## 2014-09-24 LAB — CBC
HEMATOCRIT: 25.2 % — AB (ref 39.0–52.0)
Hemoglobin: 8.2 g/dL — ABNORMAL LOW (ref 13.0–17.0)
MCH: 28.6 pg (ref 26.0–34.0)
MCHC: 32.5 g/dL (ref 30.0–36.0)
MCV: 87.8 fL (ref 78.0–100.0)
Platelets: 179 10*3/uL (ref 150–400)
RBC: 2.87 MIL/uL — ABNORMAL LOW (ref 4.22–5.81)
RDW: 17.7 % — ABNORMAL HIGH (ref 11.5–15.5)
WBC: 8.6 10*3/uL (ref 4.0–10.5)

## 2014-09-24 MED ORDER — HYDRALAZINE HCL 20 MG/ML IJ SOLN
20.0000 mg | Freq: Four times a day (QID) | INTRAMUSCULAR | Status: DC | PRN
  Administered 2014-09-24 (×2): 20 mg via INTRAVENOUS
  Filled 2014-09-24 (×2): qty 1

## 2014-09-24 MED ORDER — HYDRALAZINE HCL 20 MG/ML IJ SOLN
10.0000 mg | Freq: Four times a day (QID) | INTRAMUSCULAR | Status: DC | PRN
  Administered 2014-09-24: 10 mg via INTRAVENOUS
  Filled 2014-09-24: qty 1

## 2014-09-24 MED ORDER — CLONIDINE HCL 0.2 MG PO TABS
0.2000 mg | ORAL_TABLET | Freq: Three times a day (TID) | ORAL | Status: DC
  Administered 2014-09-24 – 2014-09-26 (×7): 0.2 mg via ORAL
  Filled 2014-09-24 (×8): qty 1

## 2014-09-24 MED ORDER — HYDRALAZINE HCL 50 MG PO TABS
50.0000 mg | ORAL_TABLET | Freq: Three times a day (TID) | ORAL | Status: DC
  Administered 2014-09-24 – 2014-09-26 (×7): 50 mg via ORAL
  Filled 2014-09-24 (×8): qty 1

## 2014-09-24 NOTE — Progress Notes (Signed)
Patient ID: Brian Lawson, male   DOB: 08-Oct-1955, 59 y.o.   MRN: [id number]  [facility] PROGRESS NOTE  Brian Lawson [id number] DOB: [birth year] DOA: 09/21/2014 PCP: Criselda Peaches, MD   Brief narrative:    59 year old male with past history of renal cell carcinoma, chronic kidney disease stage IV admitted for evaluation of BRBPR. Hemoglobin baseline around 12. On admission, hemoglobin at 8.4. Pt received total 4 units of PRBC under has required colonoscopy.  Assessment/Plan:    Principal Problem:   Acute on chronic blood loss anemia secondary to BRBPR (bright red blood per rectum) - moderate diverticulosis noted in the ascending colon and at the hepatic flexure with no active bleeding on colonoscopy  - no further bleeding, if Hg remains stable, can d/c home in AM - appreciate GI team following   Active Problems:   Essential hypertension - SBP in 150's this AM - will resume home regimen with clonidine and hydralazine    Acute on CKD (chronic kidney disease) stage 4, GFR 15-29 ml/min - Cr up on admission from BRBPR - Cr continues to trend down - will repeat BMP in AM   Renal cell carcinoma - Patient due for follow-up CT for screening   Overweight:  - Patient meets criteria with BMI greater than 25  DVT prophylaxis - SCD's  Code Status: Full.  Family Communication:  plan of care discussed with the patient Disposition Plan: Home in AM if Hg stable    IV access:  Peripheral IV  Procedures and diagnostic studies:    Ct Abdomen Pelvis Wo Contrast 09/23/2014 Status post nephrectomy. 2. No evidence for recurrence. 3. Significant colonic diverticulosis. No CT evidence for acute diverticulitis, colonic mass, or obstruction. 4. Normal appendix.    Dg Chest 2 View 09/20/2014   No neoplastic focus appreciable.  Lungs clear.    Medical Consultants:  GI  Other Consultants:  None  IAnti-Infectives:   None  Faye Ramsay, MD  [facility] Pager [phone]  If 7PM-7AM,  please contact night-coverage [email or web address] Password [facility] 09/24/2014, 2:38 PM   LOS: 3 days   HPI/Subjective: No events overnight.   Objective: Filed Vitals:   09/24/14 0038 09/24/14 0227 09/24/14 0500 09/24/14 0939  BP: 163/100 168/100 156/96 154/98  Pulse: 96 104 107 101  Temp: 98.9 F (37.2 C) 98.7 F (37.1 C) 98.4 F (36.9 C) 98.7 F (37.1 C)  TempSrc: Oral Oral Oral Oral  Resp: 18 18 18 17   Height:      Weight:      SpO2: 100% 100% 99% 100%    Intake/Output Summary (Last 24 hours) at 09/24/14 1438 Last data filed at 09/24/14 1425  Gross per 24 hour  Intake 3228.33 ml  Output   2100 ml  Net 1128.33 ml    Exam:   General:  Pt is alert, follows commands appropriately, not in acute distress  Cardiovascular: Regular rhythm, tachycardic, S1/S2, no murmurs, no rubs, no gallops  Respiratory: Clear to auscultation bilaterally, no wheezing, no crackles, no rhonchi  Abdomen: Soft, non tender, non distended, bowel sounds present, no guarding  Data Reviewed: Basic Metabolic Panel:  Recent Labs Lab 09/21/14 1614 09/22/14 0536 09/23/14 0413  NA 139 138 139  K 4.2 4.8 4.2  CL 104 106 110  CO2 26 25 22   GLUCOSE 113* 113* 113*  BUN 51* 50* 41*  CREATININE 3.80* 3.94* 3.38*  CALCIUM 9.3 8.1* 8.5*   Liver Function Tests:  Recent Labs Lab 09/21/14 1614  AST 14*  ALT  9*  ALKPHOS 42  BILITOT 0.4  PROT 6.5  ALBUMIN 3.1*   No results for input(s): LIPASE, AMYLASE in the last 168 hours. No results for input(s): AMMONIA in the last 168 hours. CBC:  Recent Labs Lab 09/21/14 1614 09/22/14 0026 09/22/14 0536 09/23/14 0915 09/24/14 0405  WBC 10.3  --  9.5 10.7* 8.6  HGB 8.4* 6.3* 7.3* 6.2* 8.2*  HCT 27.1* 20.1* 23.2* 19.5* 25.2*  MCV 85.2  --  85.9 86.7 87.8  PLT 238  --  181 174 179   Cardiac Enzymes: No results for input(s): CKTOTAL, CKMB, CKMBINDEX, TROPONINI in the last 168 hours. BNP: Invalid input(s): POCBNP CBG:  Recent Labs Lab  09/23/14 0439  GLUCAP 107*    No results found for this or any previous visit (from the past 240 hour(s)).   Scheduled Meds: . sodium chloride   Intravenous Once  . allopurinol  300 mg Oral Daily  . calcium acetate  1,334 mg Oral TID WC  . cloNIDine  0.2 mg Oral TID  . hydrALAZINE  50 mg Oral TID  . pantoprazole  40 mg Oral Daily  . predniSONE  10 mg Oral Q breakfast  . sodium bicarbonate  650 mg Oral BID  . sodium chloride  3 mL Intravenous Q12H   Continuous Infusions: . sodium chloride 100 mL/hr at 09/24/14 0710  . sodium chloride

## 2014-09-24 NOTE — Progress Notes (Signed)
Late entry:   After blood transfusion, BP 163/100.  Asymptomatic.  Notified T. Rogue Bussing NP.  New orders received: hydralazine PRN 10 mg IV ordered, and ok to continue NS @ 100 cc/hr.  Administered medication.  BP rechecked and was 168/100.  Paged Rogue Bussing.  Dosage of hydralazine increased from 10 mg PRN to 20 mg PRN.  10 mg IV hydralazine given.  Will recheck BP, and will continue to monitor.

## 2014-09-24 NOTE — Progress Notes (Signed)
Cross cover [facility] Subjective: Patient seems to be doing fairly well after his colonoscopy that revealed right sided diverticulosis. No BM over the last couple of days. Denies having any abdominal pain, nausea or vomiting.  Objective: Vital signs in last 24 hours: Temp:  [98.1 F (36.7 C)-99.3 F (37.4 C)] 98.7 F (37.1 C) (07/16 0939) Pulse Rate:  [96-110] 101 (07/16 0939) Resp:  [15-21] 17 (07/16 0939) BP: (137-168)/(72-100) 154/98 mmHg (07/16 0939) SpO2:  [92 %-100 %] 100 % (07/16 0939) Last BM Date: 09/23/14  Intake/Output from previous day: 07/15 0701 - 07/16 0700 In: 3948.3 [P.O.:460; I.V.:2518.3; Blood:970] Out: 3175 [Urine:3175] Intake/Output this shift:   General appearance: alert, cooperative, appears stated age and no distress Resp: clear to auscultation bilaterally Cardio: regular rate and rhythm, S1, S2 normal, no murmur, click, rub or gallop GI: soft, non-tender; bowel sounds normal; no masses,  no organomegaly  Lab Results:  Recent Labs  09/22/14 0536 09/23/14 0915 09/24/14 0405  WBC 9.5 10.7* 8.6  HGB 7.3* 6.2* 8.2*  HCT 23.2* 19.5* 25.2*  PLT 181 174 179   BMET  Recent Labs  09/21/14 1614 09/22/14 0536 09/23/14 0413  NA 139 138 139  K 4.2 4.8 4.2  CL 104 106 110  CO2 26 25 22   GLUCOSE 113* 113* 113*  BUN 51* 50* 41*  CREATININE 3.80* 3.94* 3.38*  CALCIUM 9.3 8.1* 8.5*   LFT  Recent Labs  09/21/14 1614  PROT 6.5  ALBUMIN 3.1*  AST 14*  ALT 9*  ALKPHOS 42  BILITOT 0.4   PT/INR  Recent Labs  09/21/14 1718  LABPROT 14.6  INR 1.12   Studies/Results: Ct Abdomen Pelvis Wo Contrast  09/23/2014   CLINICAL DATA:  Admitted for bright red blood per rectum causing acute blood loss and anemia. Suspect a diverticular bleed. Pending colonoscopy. Hemoglobin 6.3 from 7.0.  EXAM: CT ABDOMEN AND PELVIS WITHOUT CONTRAST  TECHNIQUE: Multidetector CT imaging of the abdomen and pelvis was performed following the standard protocol without IV  contrast.  COMPARISON:  03/18/2014  FINDINGS: Lower chest: There is bibasilar atelectasis. Coronary artery calcifications noted. No pericardial effusion.  Upper abdomen: No focal abnormality identified within the liver, spleen, pancreas, or adrenal glands. The right kidney has a normal appearance. Left kidney is surgically absent.  Gastrointestinal tract: Stomach and small bowel loops are normal in appearance. There are numerous colonic diverticula. Colon is normal in caliber. No acute diverticulitis or evidence for obstruction. The appendix is well seen and has a normal appearance.  Pelvis: Urinary bladder, prostate gland, and seminal vesicles have a normal appearance. No free pelvic fluid.  Retroperitoneum: There is atherosclerotic calcification of the abdominal aorta. No aneurysm. No retroperitoneal or mesenteric adenopathy.  Abdominal wall: Left anterior abdominal wall surgical site.  Osseous structures: Degenerative changes in the lumbar spine. No suspicious lytic or blastic lesions are identified.  IMPRESSION: 1. Status post nephrectomy. 2. No evidence for recurrence. 3. Significant colonic diverticulosis. No CT evidence for acute diverticulitis, colonic mass, or obstruction. 4. Normal appendix.   Electronically Signed   By: Nolon Nations M.D.   On: 09/23/2014 19:29   Medications: I have reviewed the patient's current medications.  Assessment/Plan: 1) Iron deficiency anemia s/p GI bleed thought to be due to diverticulosis. Seems stable currently from a GI standpoint. Will continue to monitor closely.  2) History of Hepatitis B.  LOS: 3 days   Derrien Anschutz 09/24/2014, 10:02 AM

## 2014-09-25 LAB — BASIC METABOLIC PANEL
ANION GAP: 4 — AB (ref 5–15)
BUN: 20 mg/dL (ref 6–20)
CO2: 25 mmol/L (ref 22–32)
Calcium: 8.7 mg/dL — ABNORMAL LOW (ref 8.9–10.3)
Chloride: 113 mmol/L — ABNORMAL HIGH (ref 101–111)
Creatinine, Ser: 3.13 mg/dL — ABNORMAL HIGH (ref 0.61–1.24)
GFR calc Af Amer: 23 mL/min — ABNORMAL LOW (ref >60)
GFR calc non Af Amer: 20 mL/min — ABNORMAL LOW (ref >60)
GLUCOSE: 121 mg/dL — AB (ref 65–99)
POTASSIUM: 4.3 mmol/L (ref 3.5–5.1)
SODIUM: 142 mmol/L (ref 135–145)

## 2014-09-25 LAB — CBC
HCT: 23.4 % — ABNORMAL LOW (ref 39.0–52.0)
Hemoglobin: 7.5 g/dL — ABNORMAL LOW (ref 13.0–17.0)
MCH: 28.4 pg (ref 26.0–34.0)
MCHC: 32.1 g/dL (ref 30.0–36.0)
MCV: 88.6 fL (ref 78.0–100.0)
Platelets: 182 10*3/uL (ref 150–400)
RBC: 2.64 MIL/uL — AB (ref 4.22–5.81)
RDW: 18.1 % — ABNORMAL HIGH (ref 11.5–15.5)
WBC: 8.7 10*3/uL (ref 4.0–10.5)

## 2014-09-25 NOTE — Progress Notes (Signed)
Patient ID: Brian Lawson, male   DOB: [birth year], 59 y.o.   MRN: [id number]  [facility] PROGRESS NOTE  Jyrin Moriarty [id number] DOB: [birth year] DOA: 09/21/2014 PCP: Criselda Peaches, MD   Brief narrative:    59 year old male with past history of renal cell carcinoma, chronic kidney disease stage IV admitted for evaluation of BRBPR. Hemoglobin baseline around 12. On admission, hemoglobin at 8.4. Pt received total 4 units of PRBC under has required colonoscopy.  Assessment/Plan:    Principal Problem:   Acute on chronic blood loss anemia secondary to BRBPR (bright red blood per rectum) - moderate diverticulosis noted in the ascending colon and at the hepatic flexure with no active bleeding on colonoscopy  - no further bleeding, if Hg remains stable, can d/c home in AM - if Hf < 7.5, plan on transfusing if needed  - appreciate GI team following   Active Problems:   Essential hypertension - SBP in 150's this AM - will resume home regimen with clonidine and hydralazine    Acute on CKD (chronic kidney disease) stage 4, GFR 15-29 ml/min - Cr up on admission from BRBPR - Cr continues to trend down - encourage PO intake    Renal cell carcinoma - Patient due for follow-up CT for screening   Overweight:  - Patient meets criteria with BMI greater than 25  DVT prophylaxis - SCD's  Code Status: Full.  Family Communication:  plan of care discussed with the patient Disposition Plan: Home in AM if Hg stable    IV access:  Peripheral IV  Procedures and diagnostic studies:    Ct Abdomen Pelvis Wo Contrast 09/23/2014 Status post nephrectomy. 2. No evidence for recurrence. 3. Significant colonic diverticulosis. No CT evidence for acute diverticulitis, colonic mass, or obstruction. 4. Normal appendix.    Dg Chest 2 View 09/20/2014   No neoplastic focus appreciable.  Lungs clear.    Medical Consultants:  GI  Other Consultants:  None  IAnti-Infectives:   None  Faye Ramsay, MD  [facility] Pager [phone]  If 7PM-7AM, please contact night-coverage [email or web address] Password [facility] 09/25/2014, 12:36 PM   LOS: 4 days   HPI/Subjective: No events overnight.   Objective: Filed Vitals:   09/24/14 0500 09/24/14 0939 09/24/14 2033 09/25/14 0524  BP: 156/96 154/98 168/92 135/94  Pulse: 107 101 97 93  Temp: 98.4 F (36.9 C) 98.7 F (37.1 C) 98.4 F (36.9 C) 98.1 F (36.7 C)  TempSrc: Oral Oral Oral Oral  Resp: 18 17 18 20   Height:      Weight:   101.152 kg (223 lb)   SpO2: 99% 100% 100% 100%    Intake/Output Summary (Last 24 hours) at 09/25/14 1236 Last data filed at 09/25/14 0900  Gross per 24 hour  Intake    603 ml  Output    350 ml  Net    253 ml    Exam:   General:  Pt is alert, follows commands appropriately, not in acute distress  Cardiovascular: Regular rhythm, tachycardic, S1/S2, no murmurs, no rubs, no gallops  Respiratory: Clear to auscultation bilaterally, no wheezing, no crackles, no rhonchi  Abdomen: Soft, non tender, non distended, bowel sounds present, no guarding  Data Reviewed: Basic Metabolic Panel:  Recent Labs Lab 09/21/14 1614 09/22/14 0536 09/23/14 0413 09/25/14 0545  NA 139 138 139 142  K 4.2 4.8 4.2 4.3  CL 104 106 110 113*  CO2 26 25 22 25   GLUCOSE 113* 113* 113* 121*  BUN 51* 50*  41* 20  CREATININE 3.80* 3.94* 3.38* 3.13*  CALCIUM 9.3 8.1* 8.5* 8.7*   Liver Function Tests:  Recent Labs Lab 09/21/14 1614  AST 14*  ALT 9*  ALKPHOS 42  BILITOT 0.4  PROT 6.5  ALBUMIN 3.1*   No results for input(s): LIPASE, AMYLASE in the last 168 hours. No results for input(s): AMMONIA in the last 168 hours. CBC:  Recent Labs Lab 09/21/14 1614 09/22/14 0026 09/22/14 0536 09/23/14 0915 09/24/14 0405 09/25/14 0545  WBC 10.3  --  9.5 10.7* 8.6 8.7  HGB 8.4* 6.3* 7.3* 6.2* 8.2* 7.5*  HCT 27.1* 20.1* 23.2* 19.5* 25.2* 23.4*  MCV 85.2  --  85.9 86.7 87.8 88.6  PLT 238  --  181 174 179 182   CBG:  Recent  Labs Lab 09/23/14 0439  GLUCAP 107*   Scheduled Meds: . allopurinol  300 mg Oral Daily  . calcium acetate  1,334 mg Oral TID WC  . cloNIDine  0.2 mg Oral TID  . hydrALAZINE  50 mg Oral TID  . pantoprazole  40 mg Oral Daily  . predniSONE  10 mg Oral Q breakfast  . sodium bicarbonate  650 mg Oral BID  . sodium chloride  3 mL Intravenous Q12H   Continuous Infusions:

## 2014-09-25 NOTE — Progress Notes (Signed)
Cross cover [facility] Subjective: Since I last evaluated the patient, he has not had any further GI bleeding. Infact he has not had a BM for 2 days now. Expected to be discharged tomorrow.  Objective: Vital signs in last 24 hours: Temp:  [98.1 F (36.7 C)-98.7 F (37.1 C)] 98.1 F (36.7 C) (07/17 0524) Pulse Rate:  [93-101] 93 (07/17 0524) Resp:  [17-20] 20 (07/17 0524) BP: (135-168)/(92-98) 135/94 mmHg (07/17 0524) SpO2:  [100 %] 100 % (07/17 0524) Weight:  [101.152 kg (223 lb)] 101.152 kg (223 lb) (07/16 2033) Last BM Date: 09/23/14  Intake/Output from previous day: 07/16 0701 - 07/17 0700 In: 603 [P.O.:600; I.V.:3] Out: 350 [Urine:350] Intake/Output this shift:    General appearance: alert, cooperative, appears stated age, no distress and moderately obese Resp: clear to auscultation bilaterally Cardio: regular rate and rhythm, S1, S2 normal, no murmur, click, rub or gallop GI: soft, non-tender; bowel sounds normal; no masses,  no organomegaly  Lab Results:  Recent Labs  09/23/14 0915 09/24/14 0405 09/25/14 0545  WBC 10.7* 8.6 8.7  HGB 6.2* 8.2* 7.5*  HCT 19.5* 25.2* 23.4*  PLT 174 179 182   BMET  Recent Labs  09/23/14 0413 09/25/14 0545  NA 139 142  K 4.2 4.3  CL 110 113*  CO2 22 25  GLUCOSE 113* 121*  BUN 41* 20  CREATININE 3.38* 3.13*  CALCIUM 8.5* 8.7*   Studies/Results: Ct Abdomen Pelvis Wo Contrast  09/23/2014   CLINICAL DATA:  Admitted for bright red blood per rectum causing acute blood loss and anemia. Suspect a diverticular bleed. Pending colonoscopy. Hemoglobin 6.3 from 7.0.  EXAM: CT ABDOMEN AND PELVIS WITHOUT CONTRAST  TECHNIQUE: Multidetector CT imaging of the abdomen and pelvis was performed following the standard protocol without IV contrast.  COMPARISON:  03/18/2014  FINDINGS: Lower chest: There is bibasilar atelectasis. Coronary artery calcifications noted. No pericardial effusion.  Upper abdomen: No focal abnormality identified within the  liver, spleen, pancreas, or adrenal glands. The right kidney has a normal appearance. Left kidney is surgically absent.  Gastrointestinal tract: Stomach and small bowel loops are normal in appearance. There are numerous colonic diverticula. Colon is normal in caliber. No acute diverticulitis or evidence for obstruction. The appendix is well seen and has a normal appearance.  Pelvis: Urinary bladder, prostate gland, and seminal vesicles have a normal appearance. No free pelvic fluid.  Retroperitoneum: There is atherosclerotic calcification of the abdominal aorta. No aneurysm. No retroperitoneal or mesenteric adenopathy.  Abdominal wall: Left anterior abdominal wall surgical site.  Osseous structures: Degenerative changes in the lumbar spine. No suspicious lytic or blastic lesions are identified.  IMPRESSION: 1. Status post nephrectomy. 2. No evidence for recurrence. 3. Significant colonic diverticulosis. No CT evidence for acute diverticulitis, colonic mass, or obstruction. 4. Normal appendix.   Electronically Signed   By: Nolon Nations M.D.   On: 09/23/2014 19:29   Medications: I have reviewed the patient's current medications.  Assessment/Plan: Anemia s/p GI bleed thought be due to diverticulosis. He should follow up with his gastroenterologist as needed.   LOS: 4 days   Swetha Rayle 09/25/2014, 8:56 AM

## 2014-09-26 ENCOUNTER — Encounter (HOSPITAL_COMMUNITY): Payer: Self-pay | Admitting: Internal Medicine

## 2014-09-26 LAB — BASIC METABOLIC PANEL
ANION GAP: 6 (ref 5–15)
BUN: 20 mg/dL (ref 6–20)
CALCIUM: 8.6 mg/dL — AB (ref 8.9–10.3)
CHLORIDE: 111 mmol/L (ref 101–111)
CO2: 26 mmol/L (ref 22–32)
CREATININE: 3.05 mg/dL — AB (ref 0.61–1.24)
GFR calc Af Amer: 24 mL/min — ABNORMAL LOW (ref >60)
GFR calc non Af Amer: 21 mL/min — ABNORMAL LOW (ref >60)
Glucose, Bld: 114 mg/dL — ABNORMAL HIGH (ref 65–99)
POTASSIUM: 4.2 mmol/L (ref 3.5–5.1)
Sodium: 143 mmol/L (ref 135–145)

## 2014-09-26 LAB — CBC
HCT: 23.2 % — ABNORMAL LOW (ref 39.0–52.0)
HEMOGLOBIN: 7.2 g/dL — AB (ref 13.0–17.0)
MCH: 28.2 pg (ref 26.0–34.0)
MCHC: 31 g/dL (ref 30.0–36.0)
MCV: 91 fL (ref 78.0–100.0)
Platelets: 187 10*3/uL (ref 150–400)
RBC: 2.55 MIL/uL — AB (ref 4.22–5.81)
RDW: 18.7 % — AB (ref 11.5–15.5)
WBC: 7.9 10*3/uL (ref 4.0–10.5)

## 2014-09-26 LAB — PREPARE RBC (CROSSMATCH)

## 2014-09-26 MED ORDER — SODIUM CHLORIDE 0.9 % IV SOLN: Freq: Once | INTRAVENOUS | Status: DC

## 2014-09-26 NOTE — Discharge Summary (Signed)
Physician Discharge Summary  Brian Lawson [id number] DOB: Sep 05, 1955 DOA: 09/21/2014  PCP: Criselda Peaches, MD  Admit date: 09/21/2014 Discharge date: 09/26/2014  Recommendations for Outpatient Follow-up:  1. Pt will need to follow up with PCP in 2-3 weeks post discharge 2. Please obtain BMP to evaluate electrolytes and kidney function 3. Please also check CBC to evaluate Hg and Hct levels   Discharge Diagnoses:  Principal Problem:   BRBPR (bright red blood per rectum) Active Problems:   Essential hypertension   CKD (chronic kidney disease) stage 4, GFR 15-29 ml/min   Lower GI bleed   Acute post-hemorrhagic anemia   Discharge Condition: Stable  Diet recommendation: Heart healthy diet discussed in details    Brief narrative:    59 year old male with past history of renal cell carcinoma, chronic kidney disease stage IV admitted for evaluation of BRBPR. Hemoglobin baseline around 12. On admission, hemoglobin at 8.4. Pt received total 4 units of PRBC under has required colonoscopy.  Assessment/Plan:    Principal Problem:  Acute on chronic blood loss anemia secondary to BRBPR (bright red blood per rectum) - moderate diverticulosis noted in the ascending colon and at the hepatic flexure with no active bleeding on colonoscopy  - Hg overall remains stable, will transfuse one unit of PRBC prior to discharge as pt wants to go home - pt advised to follow up with PCP and must have Hg repeated to make sure it is stable, pt verbalized understanding   Active Problems:  Essential hypertension - resume home medical regimen upon discharge   Acute on CKD (chronic kidney disease) stage 4, GFR 15-29 ml/min - Cr up on admission from BRBPR - Cr continues to trend down  Renal cell carcinoma - Patient due for follow-up CT for screening  Overweight:  - Patient meets criteria with BMI greater than 25   Code Status: Full.  Family Communication: plan of care discussed with  the patient Disposition Plan: Home   IV access:  Peripheral IV  Procedures and diagnostic studies:   Ct Abdomen Pelvis Wo Contrast 09/23/2014 Status post nephrectomy. 2. No evidence for recurrence. 3. Significant colonic diverticulosis. No CT evidence for acute diverticulitis, colonic mass, or obstruction. 4. Normal appendix.   Dg Chest 2 View 09/20/2014 No neoplastic focus appreciable. Lungs clear.   Medical Consultants:  GI  Other Consultants:  None  IAnti-Infectives:   None        Discharge Exam: Filed Vitals:   09/26/14 0848  BP: 109/69  Pulse: 81  Temp: 98.7 F (37.1 C)  Resp: 17   Filed Vitals:   09/25/14 0524 09/25/14 2148 09/26/14 0527 09/26/14 0848  BP: 135/94 136/86 131/77 109/69  Pulse: 93 82 77 81  Temp: 98.1 F (36.7 C) 98.2 F (36.8 C) 98.1 F (36.7 C) 98.7 F (37.1 C)  TempSrc: Oral Oral Oral Oral  Resp: 20 18 16 17   Height:      Weight:  103.42 kg (228 lb)    SpO2: 100% 100% 100% 99%    General: Pt is alert, follows commands appropriately, not in acute distress Cardiovascular: Regular rate and rhythm, no rubs, no gallops Respiratory: Clear to auscultation bilaterally, no wheezing, no crackles, no rhonchi Abdominal: Soft, non tender, non distended, bowel sounds +, no guarding Extremities: no edema, no cyanosis, pulses palpable bilaterally DP and PT  Discharge Instructions  Discharge Instructions    Diet - low sodium heart healthy    Complete by:  As directed  Increase activity slowly    Complete by:  As directed             Medication List    TAKE these medications        allopurinol 300 MG tablet  Commonly known as:  ZYLOPRIM  Take 300 mg by mouth daily.     calcium acetate 667 MG capsule  Commonly known as:  PHOSLO  Take 1,334 mg by mouth 3 (three) times daily with meals.     cholecalciferol 1000 UNITS tablet  Commonly known as:  VITAMIN D  Take 1,000 Units by mouth daily.     cloNIDine 0.2 MG  tablet  Commonly known as:  CATAPRES  Take 0.2 mg by mouth 3 (three) times daily.     docusate sodium 100 MG capsule  Commonly known as:  COLACE  Take 100 mg by mouth 2 (two) times daily.     furosemide 40 MG tablet  Commonly known as:  LASIX  Take 40 mg by mouth daily.     hydrALAZINE 50 MG tablet  Commonly known as:  APRESOLINE  Take 50 mg by mouth 3 (three) times daily. Patient states he takes a 25mg  TID as well per patient     omeprazole 40 MG capsule  Commonly known as:  PRILOSEC  Take 40 mg by mouth every morning.     predniSONE 10 MG tablet  Commonly known as:  DELTASONE  Take 10 mg by mouth daily with breakfast.     sodium bicarbonate 650 MG tablet  Take 650 mg by mouth 2 (two) times daily.            Follow-up Information    Follow up with GREEN, Keenan Bachelor, MD.   Specialty:  Internal Medicine   Contact information:   [address] [place] [address] [phone]       Call Faye Ramsay, MD.   Specialty:  Internal Medicine   Why:  As needed call my cell [phone]   Contact information:   [address] [place] [address] [phone]        The results of significant diagnostics from this hospitalization (including imaging, microbiology, ancillary and laboratory) are listed below for reference.     Microbiology: No results found for this or any previous visit (from the past 240 hour(s)).   Labs: Basic Metabolic Panel:  Recent Labs Lab 09/21/14 1614 09/22/14 0536 09/23/14 0413 09/25/14 0545 09/26/14 0554  NA 139 138 139 142 143  K 4.2 4.8 4.2 4.3 4.2  CL 104 106 110 113* 111  CO2 26 25 22 25 26   GLUCOSE 113* 113* 113* 121* 114*  BUN 51* 50* 41* 20 20  CREATININE 3.80* 3.94* 3.38* 3.13* 3.05*  CALCIUM 9.3 8.1* 8.5* 8.7* 8.6*   Liver Function Tests:  Recent Labs Lab 09/21/14 1614  AST 14*  ALT 9*  ALKPHOS 42  BILITOT 0.4  PROT 6.5  ALBUMIN 3.1*   CBC:  Recent Labs Lab  09/22/14 0536 09/23/14 0915 09/24/14 0405 09/25/14 0545 09/26/14 0554  WBC 9.5 10.7* 8.6 8.7 7.9  HGB 7.3* 6.2* 8.2* 7.5* 7.2*  HCT 23.2* 19.5* 25.2* 23.4* 23.2*  MCV 85.9 86.7 87.8 88.6 91.0  PLT 181 174 179 182 187    ProBNP (last 3 results)  Recent Labs  12/29/13 2033  PROBNP 90.8    CBG:  Recent Labs Lab 09/23/14 0439  GLUCAP 107*     SIGNED: Time coordinating  discharge: 30 minutes  Faye Ramsay, MD  [facility] 09/26/2014, 9:40 AM Pager [phone]  If 7PM-7AM, please contact night-coverage [email or web address] Password [email or web address]

## 2014-09-26 NOTE — Progress Notes (Signed)
Pt discharge instructions given, pt verbalized understanding.

## 2014-09-26 NOTE — Discharge Instructions (Signed)
Anemia, Nonspecific Anemia is a condition in which the concentration of red blood cells or hemoglobin in the blood is below normal. Hemoglobin is a substance in red blood cells that carries oxygen to the tissues of the body. Anemia results in not enough oxygen reaching these tissues.  CAUSES  Common causes of anemia include:   Excessive bleeding. Bleeding may be internal or external. This includes excessive bleeding from periods (in women) or from the intestine.   Poor nutrition.   Chronic kidney, thyroid, and liver disease.  Bone marrow disorders that decrease red blood cell production.  Cancer and treatments for cancer.  HIV, AIDS, and their treatments.  Spleen problems that increase red blood cell destruction.  Blood disorders.  Excess destruction of red blood cells due to infection, medicines, and autoimmune disorders. SIGNS AND SYMPTOMS   Minor weakness.   Dizziness.   Headache.  Palpitations.   Shortness of breath, especially with exercise.   Paleness.  Cold sensitivity.  Indigestion.  Nausea.  Difficulty sleeping.  Difficulty concentrating. Symptoms may occur suddenly or they may develop slowly.  DIAGNOSIS  Additional blood tests are often needed. These help your health care provider determine the best treatment. Your health care provider will check your stool for blood and look for other causes of blood loss.  TREATMENT  Treatment varies depending on the cause of the anemia. Treatment can include:   Supplements of iron, vitamin B12, or folic acid.   Hormone medicines.   A blood transfusion. This may be needed if blood loss is severe.   Hospitalization. This may be needed if there is significant continual blood loss.   Dietary changes.  Spleen removal. HOME CARE INSTRUCTIONS Keep all follow-up appointments. It often takes many weeks to correct anemia, and having your health care provider check on your condition and your response to  treatment is very important. SEEK IMMEDIATE MEDICAL CARE IF:   You develop extreme weakness, shortness of breath, or chest pain.   You become dizzy or have trouble concentrating.  You develop heavy vaginal bleeding.   You develop a rash.   You have bloody or black, tarry stools.   You faint.   You vomit up blood.   You vomit repeatedly.   You have abdominal pain.  You have a fever or persistent symptoms for more than 2-3 days.   You have a fever and your symptoms suddenly get worse.   You are dehydrated.  MAKE SURE YOU:  Understand these instructions.  Will watch your condition.  Will get help right away if you are not doing well or get worse. Document Released: 04/04/2004 Document Revised: 10/28/2012 Document Reviewed: 08/21/2012 Exit[employer] Patient Information 2015 [employer]. This information is not intended to replace advice given to you by your health care provider. Make sure you discuss any questions you have with your health care provider.  

## 2014-09-26 NOTE — Care Management Note (Signed)
Case Management Note  Patient Details  Name: Brian Lawson MRN: [id number] Date of Birth: 1956/02/04  Subjective/Objective:      CM following for progression and d/c planning.              Action/Plan: No d/c needs identified.   Expected Discharge Date:        09/26/2014          Expected Discharge Plan:  Home/Self Care  In-House Referral:  NA  Discharge planning Services  NA  Post Acute Care Choice:  NA Choice offered to:  NA  DME Arranged:    DME Agency:     HH Arranged:    HH Agency:     Status of Service:  Completed, signed off  Medicare Important Message Given:    Date Medicare IM Given:    Medicare IM give by:    Date Additional Medicare IM Given:    Additional Medicare Important Message give by:     If discussed at [place] of Stay Meetings, dates discussed:    Additional Comments:  Adron Bene, RN 09/26/2014, 3:08 PM

## 2014-09-27 LAB — TYPE AND SCREEN
ABO/RH(D): A POS
ANTIBODY SCREEN: NEGATIVE
Unit division: 0

## 2014-10-15 IMAGING — CT CT ABD-PELV W/O CM
1 of 2 series · 14 of 32 positions shown, 18 images · non-contrast
Comparison: MRI abdomen dated 01/03/2013

CLINICAL DATA: Left renal mass

EXAM:
CT CHEST, ABDOMEN AND PELVIS WITHOUT CONTRAST
TECHNIQUE: Multidetector CT imaging of the chest, abdomen and pelvis was
performed following the standard protocol without IV contrast.

[Series 2: cap w/o w/o st · axial · non-contrast · 0.77mm/px · z∈[-572,-28]mm · 14 of 129 slices shown, 18 images]
[im 10/129  mediastinal]
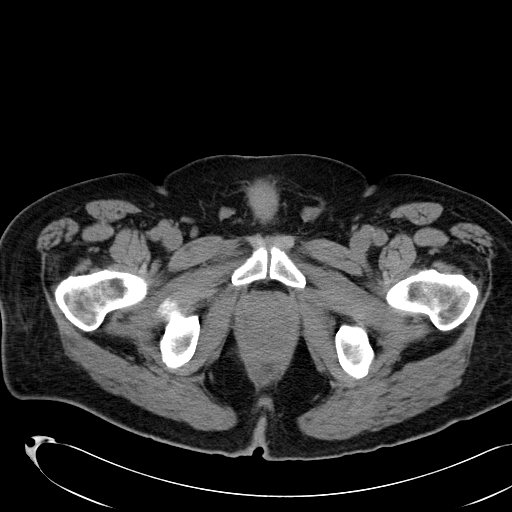
[im 10/129  lung]
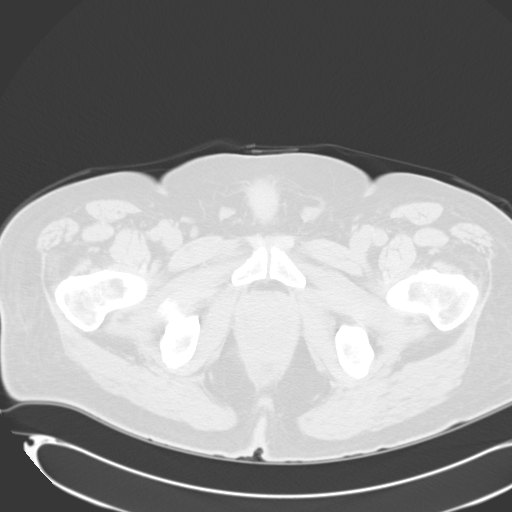
[im 20/129  lung]
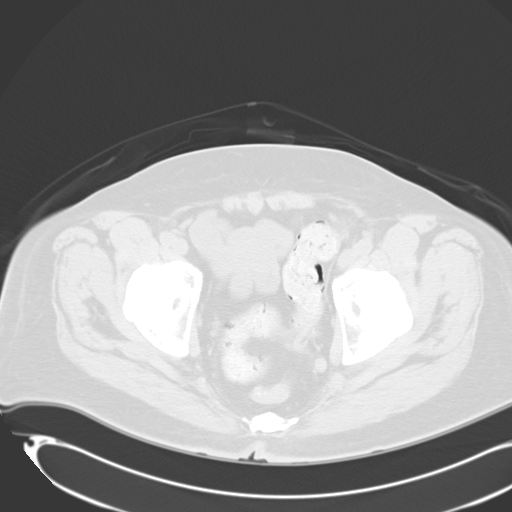
[im 30/129  lung]
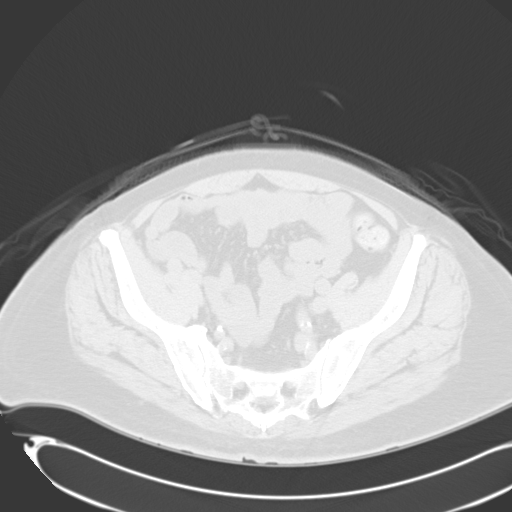
[im 40/129  lung]
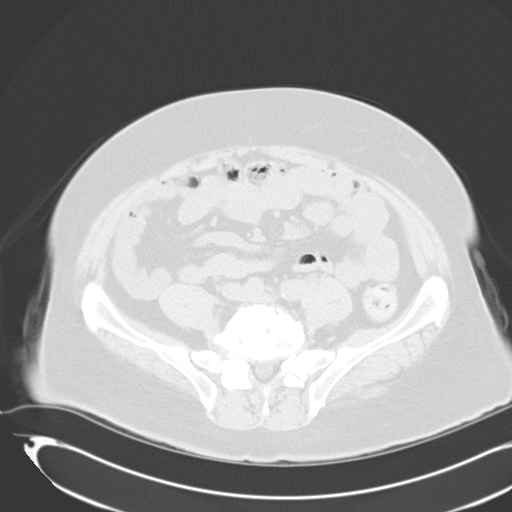
[im 50/129  mediastinal]
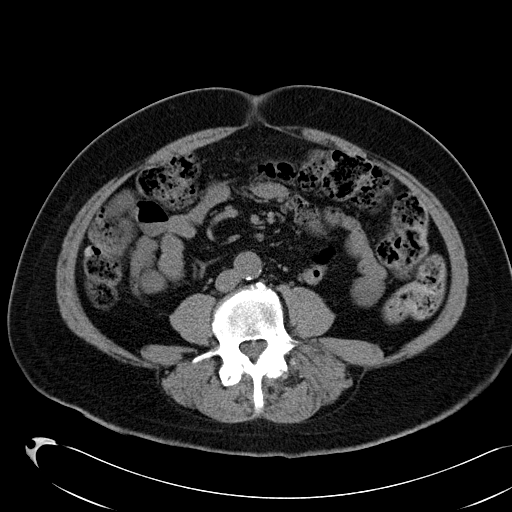
[im 50/129  lung]
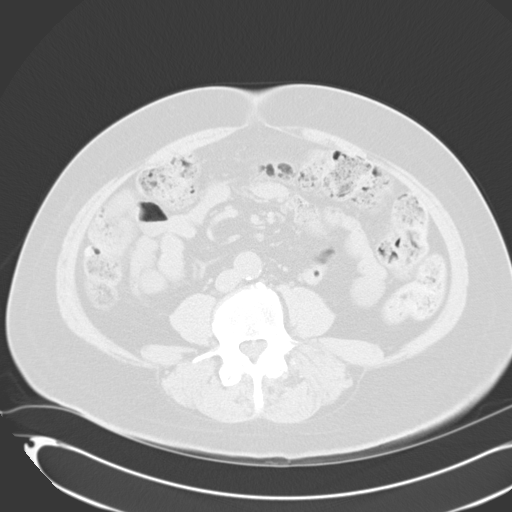
[im 60/129  lung]
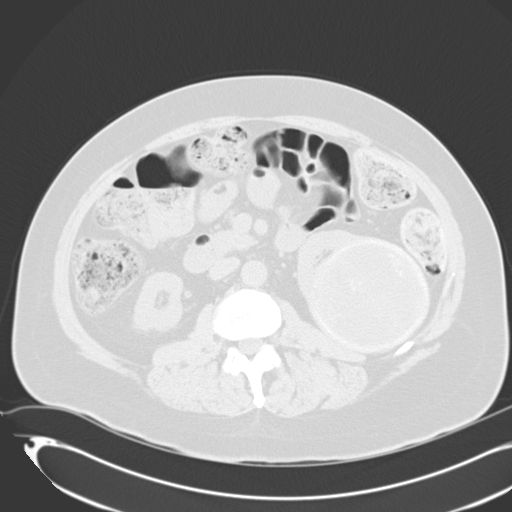
[im 63/129  lung]
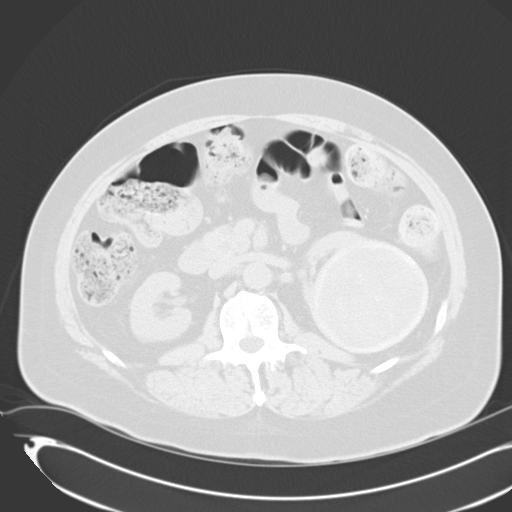
[im 65/129  lung]
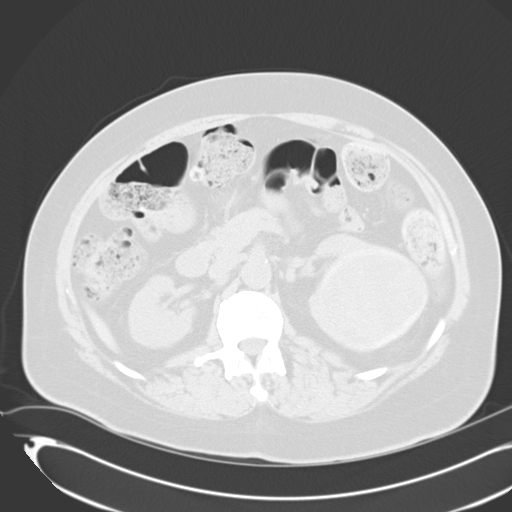
[im 69/129  mediastinal]
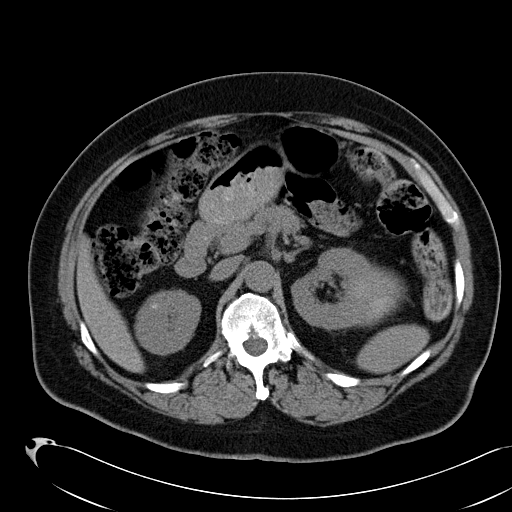
[im 69/129  lung]
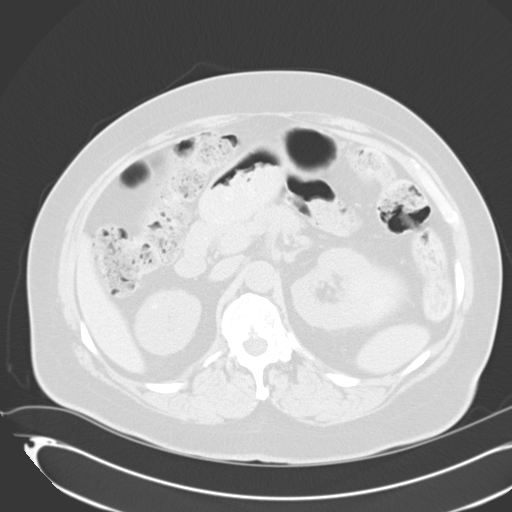
[im 79/129  lung]
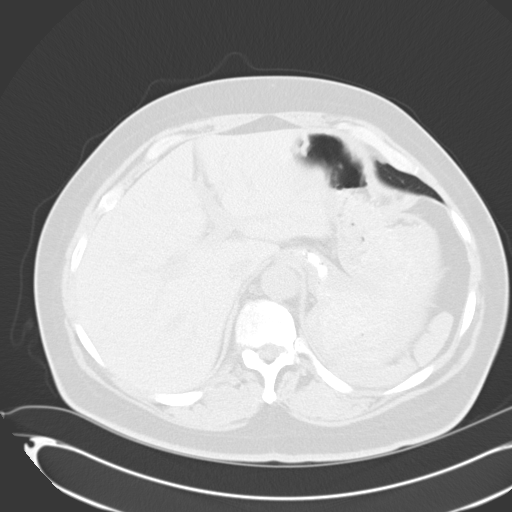
[im 89/129  lung]
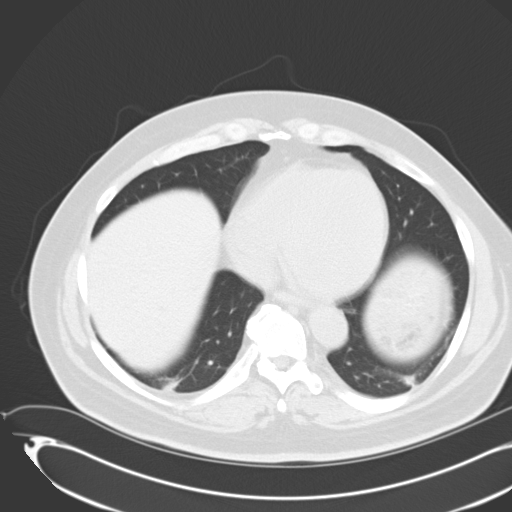
[im 99/129  lung]
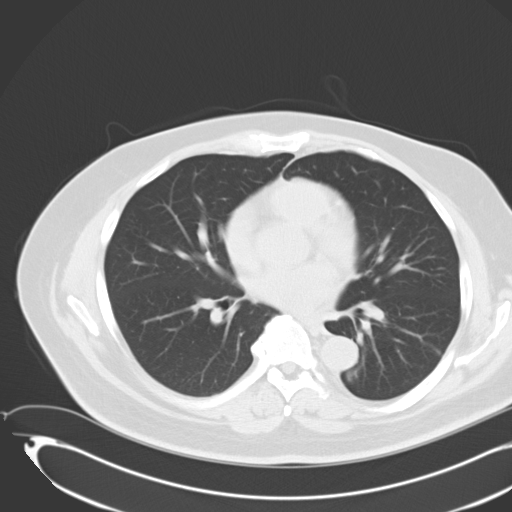
[im 109/129  mediastinal]
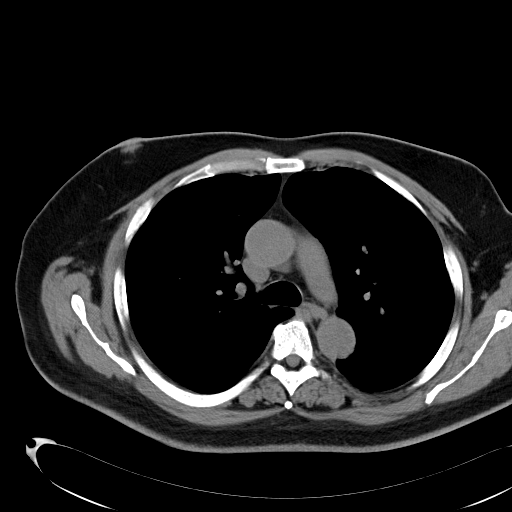
[im 109/129  lung]
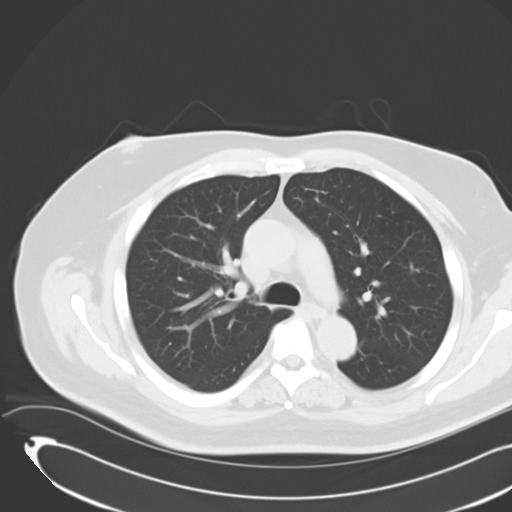
[im 119/129  lung]
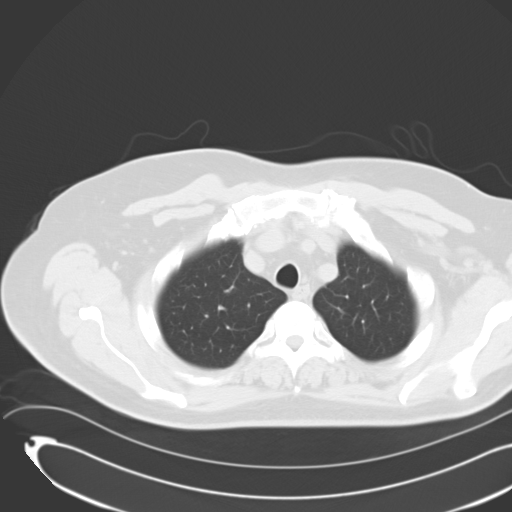

[14 of 32 positions shown; findings below may reference images not displayed]

FINDINGS: CT CHEST FINDINGS

Mild linear scarring/atelectasis in the bilateral lung bases. No
suspicious pulmonary nodules. No pleural effusion or pneumothorax.

Visualized thyroid is unremarkable.

The heart is normal in size. No pericardial effusion. Mild
atherosclerotic calcifications of the aortic arch.

No suspicious mediastinal or axillary lymphadenopathy.

Degenerative changes of the thoracic spine.

CT ABDOMEN AND PELVIS FINDINGS

Unenhanced liver, spleen, pancreas, and adrenal glands are within
normal limits.

Gallbladder is unremarkable. No intrahepatic or extrahepatic ductal
dilatation.

Right kidney is within normal limits.  No hydronephrosis.

Left lower kidney is notable for a 8.9 x 8.6 x 8.9 cm rounded lesion
with associated internal calcifications and thickened, calcified
rim. This appearance favors sequela of prior hemorrhage into a
pre-existing renal mass. While the CT appearance is unusual, given
associated restricted diffusion on prior MRI, the appearance remains
worrisome for renal cell carcinoma.

Single left renal artery and vein.  No regional lymphadenopathy.

No evidence of bowel obstruction. Mild colonic diverticulosis,
without associated inflammatory changes.

Atherosclerotic calcifications of the abdominal aorta and branch
vessels.

No abdominopelvic ascites.

No suspicious abdominopelvic lymphadenopathy.

Prostate is unremarkable.

Bladder is within normal limits.

Degenerative changes of the lower lumbar spine.
IMPRESSION: 8.9 cm partially calcified left renal lesion, favored to reflect
sequela of prior hemorrhage into a pre-existing renal mass. While
the appearance is unusual, this remains worrisome for renal cell
carcinoma.

Single left renal artery and vein.

No evidence of metastatic disease in the chest, abdomen, or pelvis.

## 2015-12-25 ENCOUNTER — Encounter (HOSPITAL_COMMUNITY): Payer: Self-pay | Admitting: Emergency Medicine

## 2015-12-25 DIAGNOSIS — M7632 Iliotibial band syndrome, left leg: Secondary | ICD-10-CM | POA: Diagnosis not present

## 2015-12-25 DIAGNOSIS — N184 Chronic kidney disease, stage 4 (severe): Secondary | ICD-10-CM | POA: Diagnosis not present

## 2015-12-25 DIAGNOSIS — Z87891 Personal history of nicotine dependence: Secondary | ICD-10-CM | POA: Insufficient documentation

## 2015-12-25 DIAGNOSIS — I129 Hypertensive chronic kidney disease with stage 1 through stage 4 chronic kidney disease, or unspecified chronic kidney disease: Secondary | ICD-10-CM | POA: Diagnosis not present

## 2015-12-25 DIAGNOSIS — Z8553 Personal history of malignant neoplasm of renal pelvis: Secondary | ICD-10-CM | POA: Insufficient documentation

## 2015-12-25 DIAGNOSIS — M25552 Pain in left hip: Secondary | ICD-10-CM | POA: Diagnosis present

## 2015-12-25 LAB — I-STAT TROPONIN, ED: TROPONIN I, POC: 0.01 ng/mL (ref 0.00–0.08)

## 2015-12-25 LAB — CBC WITH DIFFERENTIAL/PLATELET
BASOS ABS: 0 10*3/uL (ref 0.0–0.1)
BASOS PCT: 0 %
EOS ABS: 0 10*3/uL (ref 0.0–0.7)
EOS PCT: 0 %
HCT: 44.6 % (ref 39.0–52.0)
Hemoglobin: 14 g/dL (ref 13.0–17.0)
Lymphocytes Relative: 22 %
Lymphs Abs: 2.3 10*3/uL (ref 0.7–4.0)
MCH: 26.4 pg (ref 26.0–34.0)
MCHC: 31.4 g/dL (ref 30.0–36.0)
MCV: 84.2 fL (ref 78.0–100.0)
MONO ABS: 0.7 10*3/uL (ref 0.1–1.0)
MONOS PCT: 7 %
NEUTROS ABS: 7.7 10*3/uL (ref 1.7–7.7)
Neutrophils Relative %: 71 %
PLATELETS: 269 10*3/uL (ref 150–400)
RBC: 5.3 MIL/uL (ref 4.22–5.81)
RDW: 18.2 % — AB (ref 11.5–15.5)
WBC: 10.7 10*3/uL — ABNORMAL HIGH (ref 4.0–10.5)

## 2015-12-25 LAB — COMPREHENSIVE METABOLIC PANEL
ALBUMIN: 3.5 g/dL (ref 3.5–5.0)
ALT: 13 U/L — ABNORMAL LOW (ref 17–63)
ANION GAP: 10 (ref 5–15)
AST: 17 U/L (ref 15–41)
Alkaline Phosphatase: 45 U/L (ref 38–126)
BUN: 36 mg/dL — AB (ref 6–20)
CHLORIDE: 102 mmol/L (ref 101–111)
CO2: 24 mmol/L (ref 22–32)
Calcium: 9.6 mg/dL (ref 8.9–10.3)
Creatinine, Ser: 2.93 mg/dL — ABNORMAL HIGH (ref 0.61–1.24)
GFR calc Af Amer: 25 mL/min — ABNORMAL LOW (ref >60)
GFR calc non Af Amer: 22 mL/min — ABNORMAL LOW (ref >60)
GLUCOSE: 121 mg/dL — AB (ref 65–99)
POTASSIUM: 4.1 mmol/L (ref 3.5–5.1)
Sodium: 136 mmol/L (ref 135–145)
Total Bilirubin: 0.6 mg/dL (ref 0.3–1.2)
Total Protein: 7.3 g/dL (ref 6.5–8.1)

## 2015-12-25 NOTE — ED Triage Notes (Signed)
Pt c/o pain in left hip radiating down leg x's 5 days.  Also st's he has  Been having pain in central chest when he moves or walks around.

## 2015-12-26 ENCOUNTER — Emergency Department (HOSPITAL_COMMUNITY)
Admission: EM | Admit: 2015-12-26 | Discharge: 2015-12-26 | Disposition: A | Discharge status: Home / Self Care | Payer: Medicaid Other | Attending: Emergency Medicine | Admitting: Emergency Medicine

## 2015-12-26 DIAGNOSIS — M7632 Iliotibial band syndrome, left leg: Secondary | ICD-10-CM

## 2015-12-26 LAB — I-STAT TROPONIN, ED: TROPONIN I, POC: 0.01 ng/mL (ref 0.00–0.08)

## 2015-12-26 MED ORDER — DIAZEPAM 5 MG PO TABS
5.0000 mg | ORAL_TABLET | Freq: Once | ORAL | Status: AC
  Administered 2015-12-26: 5 mg via ORAL
  Filled 2015-12-26: qty 1

## 2015-12-26 MED ORDER — DEXAMETHASONE SODIUM PHOSPHATE 10 MG/ML IJ SOLN
10.0000 mg | Freq: Once | INTRAMUSCULAR | Status: AC
  Administered 2015-12-26: 10 mg via INTRAMUSCULAR
  Filled 2015-12-26: qty 1

## 2015-12-26 MED ORDER — KETOROLAC TROMETHAMINE 30 MG/ML IJ SOLN
10.0000 mg | Freq: Once | INTRAMUSCULAR | Status: AC
Start: 2015-12-26 — End: 2015-12-26
  Administered 2015-12-26: 9.9 mg via INTRAMUSCULAR
  Filled 2015-12-26: qty 1

## 2015-12-26 MED ORDER — OXYCODONE HCL 5 MG PO TABS
5.0000 mg | ORAL_TABLET | Freq: Once | ORAL | Status: AC
Start: 2015-12-26 — End: 2015-12-26
  Administered 2015-12-26: 5 mg via ORAL
  Filled 2015-12-26: qty 1

## 2015-12-26 MED ORDER — ACETAMINOPHEN 500 MG PO TABS
1000.0000 mg | ORAL_TABLET | Freq: Once | ORAL | Status: AC
  Administered 2015-12-26: 1000 mg via ORAL
  Filled 2015-12-26: qty 2

## 2015-12-26 NOTE — Discharge Instructions (Signed)
Take 4 over the counter ibuprofen tablets 3 times a day or 2 over-the-counter naproxen tablets twice a day for pain. Also take tylenol 1000mg(2 extra strength) four times a day.    

## 2015-12-26 NOTE — ED Provider Notes (Signed)
[place] DEPT Provider Note   CSN: [id number] Arrival date & time: 12/25/15  2031   By signing my name below, I, Neta Mends, attest that this documentation has been prepared under the direction and in the presence of Deno Etienne, DO . Electronically Signed: Neta Mends, ED Scribe. 12/26/2015. 2:11 AM.   History   Chief Complaint Chief Complaint  Patient presents with  . Leg Pain   The history is provided by the patient. No language interpreter was used.   HPI Comments:  Brian Lawson is a 60 y.o. male with OPMHx of CKD, cancer and gout who presents to the Emergency Department complaining of constant, radiating left hip pain x 5 days. Pt describes the pain as "sharp and excruciating." Pt states that the pain radiates down his left leg when he stands. Pt reports that he is unable to stand for longer than a few minutes. Pt also complains of a short episode of chest pain that occurred today. Pt states that the episode lasted for a few seconds when standing and then resolved. Pt describes the chest pain as "pulling." No alleviating factors noted. Pt denies injury/trauma. Pt denies SOB.   Past Medical History:  Diagnosis Date  . Arthritis    OA  . Cancer [facility]) renal lt nephrectomy  . Chronic kidney disease    ACUTE RENAL FAILURE 12/2012 STATUS POST RT ARM AV FISTULA - HAS NOT STARTED DIALYSIS; LEFT RENAL MASS  . Gout   . Hepatitis    30 years ago  . Hypertension   . Pneumonia    HOSPITALIZED 12/2012 [place] PNEUMONIA    Patient Active Problem List   Diagnosis Date Noted  . Acute post-hemorrhagic anemia   . BRBPR (bright red blood per rectum) 09/21/2014  . Lower GI bleed 09/21/2014  . Uremia 12/29/2013  . Hyponatremia 12/29/2013  . CKD (chronic kidney disease) stage 4, GFR 15-29 ml/min (HCC) 06/24/2013  . H/O renal cell cancer 05/28/2013  . Legionella pneumonia ([place]) 01/02/2013  . GOUT 12/15/2006  . Essential hypertension 12/15/2006  . OSTEOARTHRITIS  12/15/2006  . HEPATITIS B, HX OF 12/15/2006    Past Surgical History:  Procedure Laterality Date  . BASCILIC VEIN TRANSPOSITION Right 01/18/2013   Procedure: BASCILIC VEIN TRANSPOSITION;  Surgeon: Elam Dutch, MD;  Location: [place];  Service: Vascular;  Laterality: Right;  . BASCILIC VEIN TRANSPOSITION Right 04/27/2013   Procedure: BASCILIC VEIN TRANSPOSITION AND REVISION;  Surgeon: Elam Dutch, MD;  Location: [place];  Service: Vascular;  Laterality: Right;  . COLONOSCOPY N/A 09/23/2014   Procedure: COLONOSCOPY;  Surgeon: Jerene Bears, MD;  Location: [facility] ENDOSCOPY;  Service: Endoscopy;  Laterality: N/A;  . HERNIA REPAIR    . LAPAROSCOPIC NEPHRECTOMY Left 05/28/2013   Procedure: LEFT LAPAROSCOPIC  RADICAL NEPHRECTOMY;  Surgeon: Ardis Hughs, MD;  Location: [facility];  Service: Urology;  Laterality: Left;       Home Medications    Prior to Admission medications   Medication Sig Start Date End Date Taking? Authorizing Provider  allopurinol (ZYLOPRIM) 300 MG tablet Take 300 mg by mouth daily.    Historical Provider, MD  calcium acetate (PHOSLO) 667 MG capsule Take 1,334 mg by mouth 3 (three) times daily with meals. 01/25/13   Sheila Oats, MD  cholecalciferol (VITAMIN D) 1000 UNITS tablet Take 1,000 Units by mouth daily.    Historical Provider, MD  cloNIDine (CATAPRES) 0.2 MG tablet Take 0.2 mg by mouth 3 (three) times daily. 01/25/13  Sheila Oats, MD  docusate sodium (COLACE) 100 MG capsule Take 100 mg by mouth 2 (two) times daily.    Historical Provider, MD  furosemide (LASIX) 40 MG tablet Take 40 mg by mouth daily.    Historical Provider, MD  hydrALAZINE (APRESOLINE) 50 MG tablet Take 50 mg by mouth 3 (three) times daily. Patient states he takes a 25mg  TID as well per patient    Historical Provider, MD  omeprazole (PRILOSEC) 40 MG capsule Take 40 mg by mouth every morning.  01/25/13   Sheila Oats, MD  predniSONE (DELTASONE) 10 MG tablet Take 10 mg by mouth daily  with breakfast.    Historical Provider, MD  sodium bicarbonate 650 MG tablet Take 650 mg by mouth 2 (two) times daily. 01/25/13   Sheila Oats, MD    Family History Family History  Problem Relation Age of Onset  . Hypertension Mother   . Cancer Father   . Hypertension Sister     Social History Social History  Substance Use Topics  . Smoking status: Former Research scientist (life sciences)  . Smokeless tobacco: Never Used  . Alcohol use Yes     Comment: ocassionally  QUIT SMOKING IN 2014     Allergies   Review of patient's allergies indicates no known allergies.   Review of Systems Review of Systems  Respiratory: Negative for shortness of breath.   Cardiovascular: Positive for chest pain.  Musculoskeletal: Positive for arthralgias and myalgias.  All other systems reviewed and are negative.    Physical Exam Updated Vital Signs BP 141/87   Pulse 88   Temp 97.6 F (36.4 C) (Oral)   Resp 20   Ht 6\' 3"  (1.905 m)   Wt 230 lb (104.3 kg)   SpO2 98%   BMI 28.75 kg/m   Physical Exam  Constitutional: He is oriented to person, place, and time. He appears well-developed and well-nourished.  HENT:  Head: Normocephalic and atraumatic.  Eyes: EOM are normal. Pupils are equal, round, and reactive to light.  Neck: Normal range of motion. Neck supple. No JVD present.  Cardiovascular: Normal rate, regular rhythm, normal heart sounds and intact distal pulses.  Exam reveals no gallop and no friction rub.   No murmur heard. Pulmonary/Chest: No respiratory distress. He has no wheezes.  Abdominal: He exhibits no distension. There is no rebound and no guarding.  Musculoskeletal: Normal range of motion.  Tenderness to lateral aspect of left knee. No midline spinal tenderness. No pain in piriformis. Sensation is intact.  Neurological: He is alert and oriented to person, place, and time.  Skin: No rash noted. No pallor.  Psychiatric: He has a normal mood and affect. His behavior is normal.  Nursing note  and vitals reviewed.    ED Treatments / Results  DIAGNOSTIC STUDIES:  Oxygen Saturation is 95% on RA, normal by my interpretation.    COORDINATION OF CARE:  2:11 AM Discussed treatment plan with pt at bedside and pt agreed to plan.   Labs (all labs ordered are listed, but only abnormal results are displayed) Labs Reviewed  CBC WITH DIFFERENTIAL/PLATELET - Abnormal; Notable for the following:       Result Value   WBC 10.7 (*)    RDW 18.2 (*)    All other components within normal limits  COMPREHENSIVE METABOLIC PANEL - Abnormal; Notable for the following:    Glucose, Bld 121 (*)    BUN 36 (*)    Creatinine, Ser 2.93 (*)    ALT  13 (*)    GFR calc non Af Amer 22 (*)    GFR calc Af Amer 25 (*)    All other components within normal limits  I-STAT TROPOININ, ED  I-STAT TROPOININ, ED    EKG  EKG Interpretation  Date/Time:  Monday December 25 2015 20:48:09 EDT Ventricular Rate:  111 PR Interval:  136 QRS Duration: 106 QT Interval:  312 QTC Calculation: 424 R Axis:   14 Text Interpretation:  Sinus tachycardia Moderate voltage criteria for LVH, may be normal variant Nonspecific ST and T wave abnormality Abnormal ECG flipped t waves in inferior and lateral leads not seen on prior Otherwise no significant change Confirmed by Isaac Lacson MD, DANIEL ([id number]) on 12/26/2015 1:55:29 AM       Radiology No results found.  Procedures Procedures (including critical care time)  Medications Ordered in ED Medications  oxyCODONE (Oxy IR/ROXICODONE) immediate release tablet 5 mg (5 mg Oral Given 12/26/15 0255)  diazepam (VALIUM) tablet 5 mg (5 mg Oral Given 12/26/15 0254)  ketorolac (TORADOL) 30 MG/ML injection 9.9 mg (9.9 mg Intramuscular Given 12/26/15 0259)  dexamethasone (DECADRON) injection 10 mg (10 mg Intramuscular Given 12/26/15 0255)  acetaminophen (TYLENOL) tablet 1,000 mg (1,000 mg Oral Given 12/26/15 0255)     Initial Impression / Assessment and Plan / ED Course  I have  reviewed the triage vital signs and the nursing notes.  Pertinent labs & imaging results that were available during my care of the patient were reviewed by me and considered in my medical decision making (see chart for details).  Clinical Course    61 yo M With a chief complaint of left lateral leg pain. This is worse about the knee and goes up into the lateral aspect of the hip. Worse with movement and palpation. On my exam it's worsened with abduction. I feel the patient likely has IT band syndrome. Treat his pain here PCP follow-up. Given stretches.  3:13 AM:  I have discussed the diagnosis/risks/treatment options with the patient and family and believe the pt to be eligible for discharge home to follow-up with PCP. We also discussed returning to the ED immediately if new or worsening sx occur. We discussed the sx which are most concerning (e.g., cauda equina s/sx, fever) that necessitate immediate return. Medications administered to the patient during their visit and any new prescriptions provided to the patient are listed below.  Medications given during this visit Medications  oxyCODONE (Oxy IR/ROXICODONE) immediate release tablet 5 mg (5 mg Oral Given 12/26/15 0255)  diazepam (VALIUM) tablet 5 mg (5 mg Oral Given 12/26/15 0254)  ketorolac (TORADOL) 30 MG/ML injection 9.9 mg (9.9 mg Intramuscular Given 12/26/15 0259)  dexamethasone (DECADRON) injection 10 mg (10 mg Intramuscular Given 12/26/15 0255)  acetaminophen (TYLENOL) tablet 1,000 mg (1,000 mg Oral Given 12/26/15 0255)     The patient appears reasonably screen and/or stabilized for discharge and I doubt any other medical condition or other [facility] requiring further screening, evaluation, or treatment in the ED at this time prior to discharge.    Final Clinical Impressions(s) / ED Diagnoses   Final diagnoses:  It band syndrome, left    New Prescriptions New Prescriptions   No medications on file   I personally performed the  services described in this documentation, which was scribed in my presence. The recorded information has been reviewed and is accurate.     Deno Etienne, DO 12/26/15 [phone]

## 2016-04-22 ENCOUNTER — Other Ambulatory Visit: Payer: Self-pay | Admitting: Urology

## 2016-04-22 DIAGNOSIS — C642 Malignant neoplasm of left kidney, except renal pelvis: Secondary | ICD-10-CM

## 2016-05-07 ENCOUNTER — Encounter (HOSPITAL_COMMUNITY): Payer: Self-pay

## 2016-05-07 ENCOUNTER — Ambulatory Visit (HOSPITAL_COMMUNITY)
Admission: RE | Admit: 2016-05-07 | Discharge: 2016-05-07 | Disposition: A | Discharge status: Home / Self Care | Payer: Medicaid Other | Source: Ambulatory Visit | Attending: Urology | Admitting: Urology

## 2016-06-06 ENCOUNTER — Ambulatory Visit (HOSPITAL_COMMUNITY)
Admission: RE | Admit: 2016-06-06 | Discharge: 2016-06-06 | Disposition: A | Discharge status: Home / Self Care | Payer: Medicaid Other | Source: Ambulatory Visit | Attending: Urology | Admitting: Urology

## 2016-06-06 ENCOUNTER — Other Ambulatory Visit: Payer: Self-pay | Admitting: Urology

## 2016-06-06 DIAGNOSIS — C642 Malignant neoplasm of left kidney, except renal pelvis: Secondary | ICD-10-CM | POA: Diagnosis present

## 2016-06-06 DIAGNOSIS — Z905 Acquired absence of kidney: Secondary | ICD-10-CM | POA: Diagnosis not present

## 2016-06-06 DIAGNOSIS — C641 Malignant neoplasm of right kidney, except renal pelvis: Secondary | ICD-10-CM

## 2016-09-13 ENCOUNTER — Other Ambulatory Visit: Payer: Self-pay | Admitting: Urology

## 2016-09-13 DIAGNOSIS — C642 Malignant neoplasm of left kidney, except renal pelvis: Secondary | ICD-10-CM

## 2016-10-24 ENCOUNTER — Encounter (HOSPITAL_COMMUNITY): Payer: Self-pay

## 2016-10-24 ENCOUNTER — Ambulatory Visit (HOSPITAL_COMMUNITY)
Admission: RE | Admit: 2016-10-24 | Discharge: 2016-10-24 | Disposition: A | Discharge status: Home / Self Care | Payer: Self-pay | Source: Ambulatory Visit | Attending: Urology | Admitting: Urology

## 2017-11-20 DIAGNOSIS — I10 Essential (primary) hypertension: Secondary | ICD-10-CM | POA: Diagnosis not present

## 2017-11-20 DIAGNOSIS — Z6841 Body Mass Index (BMI) 40.0 and over, adult: Secondary | ICD-10-CM | POA: Diagnosis not present

## 2017-11-20 DIAGNOSIS — M109 Gout, unspecified: Secondary | ICD-10-CM | POA: Diagnosis not present

## 2017-11-20 DIAGNOSIS — N179 Acute kidney failure, unspecified: Secondary | ICD-10-CM | POA: Diagnosis not present

## 2017-12-09 ENCOUNTER — Other Ambulatory Visit: Payer: Self-pay | Admitting: Urology

## 2017-12-09 ENCOUNTER — Ambulatory Visit (HOSPITAL_COMMUNITY)
Admission: RE | Admit: 2017-12-09 | Discharge: 2017-12-09 | Disposition: A | Discharge status: Home / Self Care | Payer: BLUE CROSS/BLUE SHIELD | Source: Ambulatory Visit | Attending: Urology | Admitting: Urology

## 2017-12-09 DIAGNOSIS — C642 Malignant neoplasm of left kidney, except renal pelvis: Secondary | ICD-10-CM

## 2017-12-09 DIAGNOSIS — I10 Essential (primary) hypertension: Secondary | ICD-10-CM | POA: Diagnosis not present

## 2017-12-09 DIAGNOSIS — C649 Malignant neoplasm of unspecified kidney, except renal pelvis: Secondary | ICD-10-CM | POA: Diagnosis not present

## 2017-12-12 DIAGNOSIS — C642 Malignant neoplasm of left kidney, except renal pelvis: Secondary | ICD-10-CM | POA: Diagnosis not present

## 2017-12-12 DIAGNOSIS — K5730 Diverticulosis of large intestine without perforation or abscess without bleeding: Secondary | ICD-10-CM | POA: Diagnosis not present

## 2017-12-17 DIAGNOSIS — N184 Chronic kidney disease, stage 4 (severe): Secondary | ICD-10-CM | POA: Diagnosis not present

## 2017-12-17 DIAGNOSIS — D631 Anemia in chronic kidney disease: Secondary | ICD-10-CM | POA: Diagnosis not present

## 2017-12-17 DIAGNOSIS — N2581 Secondary hyperparathyroidism of renal origin: Secondary | ICD-10-CM | POA: Diagnosis not present

## 2017-12-17 DIAGNOSIS — I129 Hypertensive chronic kidney disease with stage 1 through stage 4 chronic kidney disease, or unspecified chronic kidney disease: Secondary | ICD-10-CM | POA: Diagnosis not present

## 2018-08-26 DIAGNOSIS — D631 Anemia in chronic kidney disease: Secondary | ICD-10-CM | POA: Diagnosis not present

## 2018-08-26 DIAGNOSIS — N184 Chronic kidney disease, stage 4 (severe): Secondary | ICD-10-CM | POA: Diagnosis not present

## 2018-08-26 DIAGNOSIS — I129 Hypertensive chronic kidney disease with stage 1 through stage 4 chronic kidney disease, or unspecified chronic kidney disease: Secondary | ICD-10-CM | POA: Diagnosis not present

## 2018-08-26 DIAGNOSIS — N2581 Secondary hyperparathyroidism of renal origin: Secondary | ICD-10-CM | POA: Diagnosis not present

## 2018-09-21 DIAGNOSIS — R1013 Epigastric pain: Secondary | ICD-10-CM | POA: Diagnosis not present

## 2019-01-07 DIAGNOSIS — I10 Essential (primary) hypertension: Secondary | ICD-10-CM | POA: Diagnosis not present

## 2019-01-07 DIAGNOSIS — C642 Malignant neoplasm of left kidney, except renal pelvis: Secondary | ICD-10-CM | POA: Diagnosis not present

## 2019-01-07 DIAGNOSIS — N184 Chronic kidney disease, stage 4 (severe): Secondary | ICD-10-CM | POA: Diagnosis not present

## 2019-01-07 DIAGNOSIS — Z23 Encounter for immunization: Secondary | ICD-10-CM | POA: Diagnosis not present

## 2019-03-29 ENCOUNTER — Encounter: Payer: Self-pay | Admitting: Nephrology

## 2019-05-01 ENCOUNTER — Ambulatory Visit: Payer: Medicare Other | Attending: Internal Medicine

## 2019-05-01 DIAGNOSIS — Z23 Encounter for immunization: Secondary | ICD-10-CM

## 2019-05-01 NOTE — Progress Notes (Signed)
   Covid-19 Vaccination Clinic  Name:  Brian Lawson    MRN: [id number] DOB: [birth year]  05/01/2019  Brian Lawson was observed post Covid-19 immunization for 15 minutes without incidence. He was provided with Vaccine Information Sheet and instruction to access the V-Safe system.   Brian Lawson was instructed to call 911 with any severe reactions post vaccine: Marland Kitchen Difficulty breathing  . Swelling of your face and throat  . A fast heartbeat  . A bad rash all over your body  . Dizziness and weakness    Immunizations Administered    Name Date Dose VIS Date Route   Pfizer COVID-19 Vaccine 05/01/2019  9:32 AM 0.3 mL 02/19/2019 Intramuscular   Manufacturer: [place]   Lot: [id number]   [place]: [id number]

## 2019-05-25 ENCOUNTER — Ambulatory Visit: Payer: Medicare Other | Attending: Internal Medicine

## 2019-05-25 DIAGNOSIS — Z23 Encounter for immunization: Secondary | ICD-10-CM

## 2019-05-25 NOTE — Progress Notes (Signed)
   Covid-19 Vaccination Clinic  Name:  [name]    MRN: [id number] DOB: June 05, 1955  05/25/2019  [name] was observed post Covid-19 immunization for 15 minutes without incident. He was provided with Vaccine Information Sheet and instruction to access the V-Safe system.   [name] was instructed to call 911 with any severe reactions post vaccine: Marland Kitchen Difficulty breathing  . Swelling of face and throat  . A fast heartbeat  . A bad rash all over body  . Dizziness and weakness   Immunizations Administered    Name Date Dose VIS Date Route   Pfizer COVID-19 Vaccine 05/25/2019 10:25 AM 0.3 mL 02/19/2019 Intramuscular   Manufacturer: [place]   Lot: [id number]   [place]: [id number]

## 2019-10-03 ENCOUNTER — Emergency Department (HOSPITAL_COMMUNITY): Payer: Medicare Other

## 2019-10-03 ENCOUNTER — Emergency Department (HOSPITAL_COMMUNITY)
Admission: EM | Admit: 2019-10-03 | Discharge: 2019-10-03 | Disposition: A | Discharge status: Home / Self Care | Payer: Medicare Other | Attending: Emergency Medicine | Admitting: Emergency Medicine

## 2019-10-03 ENCOUNTER — Other Ambulatory Visit: Payer: Self-pay

## 2019-10-03 ENCOUNTER — Encounter (HOSPITAL_COMMUNITY): Payer: Self-pay | Admitting: Emergency Medicine

## 2019-10-03 DIAGNOSIS — Z79899 Other long term (current) drug therapy: Secondary | ICD-10-CM | POA: Diagnosis not present

## 2019-10-03 DIAGNOSIS — K5792 Diverticulitis of intestine, part unspecified, without perforation or abscess without bleeding: Secondary | ICD-10-CM | POA: Diagnosis not present

## 2019-10-03 DIAGNOSIS — N184 Chronic kidney disease, stage 4 (severe): Secondary | ICD-10-CM | POA: Insufficient documentation

## 2019-10-03 DIAGNOSIS — I129 Hypertensive chronic kidney disease with stage 1 through stage 4 chronic kidney disease, or unspecified chronic kidney disease: Secondary | ICD-10-CM | POA: Diagnosis not present

## 2019-10-03 DIAGNOSIS — R1032 Left lower quadrant pain: Secondary | ICD-10-CM | POA: Diagnosis present

## 2019-10-03 DIAGNOSIS — Z87891 Personal history of nicotine dependence: Secondary | ICD-10-CM | POA: Diagnosis not present

## 2019-10-03 LAB — COMPREHENSIVE METABOLIC PANEL
ALT: 16 U/L (ref 0–44)
AST: 22 U/L (ref 15–41)
Albumin: 3.3 g/dL — ABNORMAL LOW (ref 3.5–5.0)
Alkaline Phosphatase: 58 U/L (ref 38–126)
Anion gap: 13 (ref 5–15)
BUN: 26 mg/dL — ABNORMAL HIGH (ref 8–23)
CO2: 22 mmol/L (ref 22–32)
Calcium: 9.5 mg/dL (ref 8.9–10.3)
Chloride: 103 mmol/L (ref 98–111)
Creatinine, Ser: 2.87 mg/dL — ABNORMAL HIGH (ref 0.61–1.24)
GFR calc Af Amer: 25 mL/min — ABNORMAL LOW (ref >60)
GFR calc non Af Amer: 22 mL/min — ABNORMAL LOW (ref >60)
Glucose, Bld: 106 mg/dL — ABNORMAL HIGH (ref 70–99)
Potassium: 3.8 mmol/L (ref 3.5–5.1)
Sodium: 138 mmol/L (ref 135–145)
Total Bilirubin: 0.9 mg/dL (ref 0.3–1.2)
Total Protein: 7.5 g/dL (ref 6.5–8.1)

## 2019-10-03 LAB — CBC
HCT: 44.9 % (ref 39.0–52.0)
Hemoglobin: 13.8 g/dL (ref 13.0–17.0)
MCH: 27.5 pg (ref 26.0–34.0)
MCHC: 30.7 g/dL (ref 30.0–36.0)
MCV: 89.4 fL (ref 80.0–100.0)
Platelets: 225 10*3/uL (ref 150–400)
RBC: 5.02 MIL/uL (ref 4.22–5.81)
RDW: 16.1 % — ABNORMAL HIGH (ref 11.5–15.5)
WBC: 9.9 10*3/uL (ref 4.0–10.5)
nRBC: 0 % (ref 0.0–0.2)

## 2019-10-03 LAB — LIPASE, BLOOD: Lipase: 34 U/L (ref 11–51)

## 2019-10-03 MED ORDER — SODIUM CHLORIDE 0.9 % IV BOLUS (SEPSIS)
1000.0000 mL | Freq: Once | INTRAVENOUS | Status: AC
  Administered 2019-10-03: 1000 mL via INTRAVENOUS

## 2019-10-03 MED ORDER — ONDANSETRON HCL 4 MG/2ML IJ SOLN
4.0000 mg | Freq: Once | INTRAMUSCULAR | Status: DC
  Filled 2019-10-03: qty 2

## 2019-10-03 MED ORDER — IOHEXOL 9 MG/ML PO SOLN: 500.0000 mL | ORAL | Status: AC

## 2019-10-03 MED ORDER — AMOXICILLIN-POT CLAVULANATE 500-125 MG PO TABS
1.0000 | ORAL_TABLET | Freq: Two times a day (BID) | ORAL | Status: DC
  Administered 2019-10-03: 500 mg via ORAL
  Filled 2019-10-03: qty 1

## 2019-10-03 MED ORDER — IOHEXOL 9 MG/ML PO SOLN
ORAL | Status: AC
  Filled 2019-10-03: qty 500

## 2019-10-03 MED ORDER — AMOXICILLIN-POT CLAVULANATE 500-125 MG PO TABS
1.0000 | ORAL_TABLET | Freq: Two times a day (BID) | ORAL | 0 refills | Status: AC
Start: 2019-10-03 — End: 2019-10-13

## 2019-10-03 MED ORDER — MORPHINE SULFATE (PF) 4 MG/ML IV SOLN
4.0000 mg | Freq: Once | INTRAVENOUS | Status: DC
  Filled 2019-10-03: qty 1

## 2019-10-03 MED ORDER — SODIUM CHLORIDE 0.9 % IV SOLN: 1000.0000 mL | INTRAVENOUS | Status: DC

## 2019-10-03 MED ORDER — SODIUM CHLORIDE 0.9% FLUSH
3.0000 mL | Freq: Once | INTRAVENOUS | Status: AC
  Administered 2019-10-03: 3 mL via INTRAVENOUS

## 2019-10-03 MED ORDER — ONDANSETRON 4 MG PO TBDP: 4.0000 mg | ORAL_TABLET | Freq: Three times a day (TID) | ORAL | 0 refills | Status: DC | PRN

## 2019-10-03 NOTE — ED Provider Notes (Signed)
[facility] EMERGENCY DEPARTMENT Provider Note   CSN: [id number] Arrival date & time: 10/03/19  1401     History Chief Complaint  Patient presents with  . Abdominal Pain  . Diarrhea    Brian Lawson is a 64 y.o. male.  The history is provided by the patient.  Abdominal Pain Pain location:  LLQ and RLQ Pain quality: sharp   Pain radiates to:  Does not radiate Pain severity:  Severe Onset quality:  Gradual Progression:  Waxing and waning Worsened by:  Nothing Associated symptoms: anorexia, belching and diarrhea   Risk factors comment:  Prior hernia repair, prior nephrectomy Diarrhea Associated symptoms: abdominal pain        Past Medical History:  Diagnosis Date  . Anemia associated with chronic renal failure   . Arthritis    OA  . Cancer [facility]) renal lt nephrectomy  . Chronic kidney disease    ACUTE RENAL FAILURE 12/2012 STATUS POST RT ARM AV FISTULA - HAS NOT STARTED DIALYSIS; LEFT RENAL MASS  . Gout   . Hepatitis    30 years ago  . Hypertension   . Pneumonia    HOSPITALIZED 12/2012 [place] PNEUMONIA    Patient Active Problem List   Diagnosis Date Noted  . Acute post-hemorrhagic anemia   . BRBPR (bright red blood per rectum) 09/21/2014  . Lower GI bleed 09/21/2014  . Uremia 12/29/2013  . Hyponatremia 12/29/2013  . CKD (chronic kidney disease) stage 4, GFR 15-29 ml/min (HCC) 06/24/2013  . H/O renal cell cancer 05/28/2013  . Legionella pneumonia ([place]) 01/02/2013  . GOUT 12/15/2006  . Essential hypertension 12/15/2006  . OSTEOARTHRITIS 12/15/2006  . HEPATITIS B, HX OF 12/15/2006    Past Surgical History:  Procedure Laterality Date  . BASCILIC VEIN TRANSPOSITION Right 01/18/2013   Procedure: BASCILIC VEIN TRANSPOSITION;  Surgeon: Elam Dutch, MD;  Location: [place];  Service: Vascular;  Laterality: Right;  . BASCILIC VEIN TRANSPOSITION Right 04/27/2013   Procedure: BASCILIC VEIN TRANSPOSITION AND REVISION;  Surgeon: Elam Dutch, MD;  Location: [place];  Service: Vascular;  Laterality: Right;  . COLONOSCOPY N/A 09/23/2014   Procedure: COLONOSCOPY;  Surgeon: Jerene Bears, MD;  Location: [facility] ENDOSCOPY;  Service: Endoscopy;  Laterality: N/A;  . HERNIA REPAIR    . LAPAROSCOPIC NEPHRECTOMY Left 05/28/2013   Procedure: LEFT LAPAROSCOPIC  RADICAL NEPHRECTOMY;  Surgeon: Ardis Hughs, MD;  [facility];  Service: Urology;  Laterality: Left;       Family History  Problem Relation Age of Onset  . Hypertension Mother   . Cancer Father   . Hypertension Sister     Social History   Tobacco Use  . Smoking status: Former Research scientist (life sciences)  . Smokeless tobacco: Never Used  Substance Use Topics  . Alcohol use: Yes    Comment: ocassionally  QUIT SMOKING IN 2014  . Drug use: No    Home Medications Prior to Admission medications   Medication Sig Start Date End Date Taking? Authorizing Provider  allopurinol (ZYLOPRIM) 300 MG tablet Take 300 mg by mouth daily.    [provider]  amoxicillin-clavulanate (AUGMENTIN) 500-125 MG tablet Take 1 tablet (500 mg total) by mouth in the morning and at bedtime for 10 days. 10/03/19 10/13/19  Dorie Rank, MD  calcium acetate (PHOSLO) 667 MG capsule Take 1,334 mg by mouth 3 (three) times daily with meals. 01/25/13   Viyuoh, Alison Stalling, MD  cholecalciferol (VITAMIN D) 1000 UNITS tablet Take 1,000 Units by  mouth daily.    [provider]  cloNIDine (CATAPRES) 0.2 MG tablet Take 0.2 mg by mouth 3 (three) times daily. 01/25/13   Viyuoh, Alison Stalling, MD  docusate sodium (COLACE) 100 MG capsule Take 100 mg by mouth 2 (two) times daily.    [provider]  furosemide (LASIX) 40 MG tablet Take 40 mg by mouth daily.    [provider]  hydrALAZINE (APRESOLINE) 50 MG tablet Take 50 mg by mouth 3 (three) times daily. Patient states he takes a 25mg  TID as well per patient    [provider]  omeprazole (PRILOSEC) 40 MG capsule Take 40 mg by mouth every morning.   01/25/13   Viyuoh, Alison Stalling, MD  ondansetron (ZOFRAN ODT) 4 MG disintegrating tablet Take 1 tablet (4 mg total) by mouth every 8 (eight) hours as needed for nausea or vomiting. 10/03/19   Dorie Rank, MD  predniSONE (DELTASONE) 10 MG tablet Take 10 mg by mouth daily with breakfast.    [provider]  sodium bicarbonate 650 MG tablet Take 650 mg by mouth 2 (two) times daily. 01/25/13   Sheila Oats, MD    Allergies    Patient has no known allergies.  Review of Systems   Review of Systems  Gastrointestinal: Positive for abdominal pain, anorexia and diarrhea.  All other systems reviewed and are negative.   Physical Exam Updated Vital Signs BP (!) 147/97   Pulse 91   Temp 98.8 F (37.1 C) (Oral)   Resp 18   Ht 1.905 m (6\' 3" )   Wt (!) 99.8 kg   SpO2 97%   BMI 27.50 kg/m   Physical Exam Vitals and nursing note reviewed.  Constitutional:      General: He is not in acute distress.    Appearance: He is well-developed.  HENT:     Head: Normocephalic and atraumatic.     Right Ear: External ear normal.     Left Ear: External ear normal.  Eyes:     General: No scleral icterus.       Right eye: No discharge.        Left eye: No discharge.     Conjunctiva/sclera: Conjunctivae normal.  Neck:     Trachea: No tracheal deviation.  Cardiovascular:     Rate and Rhythm: Normal rate and regular rhythm.  Pulmonary:     Effort: Pulmonary effort is normal. No respiratory distress.     Breath sounds: Normal breath sounds. No stridor. No wheezing or rales.  Abdominal:     General: Bowel sounds are normal. There is no distension.     Palpations: Abdomen is soft.     Tenderness: There is abdominal tenderness in the right lower quadrant and left lower quadrant. There is no guarding or rebound.  Musculoskeletal:        General: No tenderness.     Cervical back: Neck supple.  Skin:    General: Skin is warm and dry.     Findings: No rash.  Neurological:     Mental Status: He  is alert.     Cranial Nerves: No cranial nerve deficit (no facial droop, extraocular movements intact, no slurred speech).     Sensory: No sensory deficit.     Motor: No abnormal muscle tone or seizure activity.     Coordination: Coordination normal.     ED Results / Procedures / Treatments   Labs (all labs ordered are listed, but only abnormal results are displayed)  Labs Reviewed  COMPREHENSIVE METABOLIC PANEL - Abnormal; Notable for the following components:      Result Value   Glucose, Bld 106 (*)    BUN 26 (*)    Creatinine, Ser 2.87 (*)    Albumin 3.3 (*)    GFR calc non Af Amer 22 (*)    GFR calc Af Amer 25 (*)    All other components within normal limits  CBC - Abnormal; Notable for the following components:   RDW 16.1 (*)    All other components within normal limits  LIPASE, BLOOD  URINALYSIS, ROUTINE W REFLEX MICROSCOPIC    EKG EKG Interpretation  Date/Time:  Sunday October 03 2019 14:07:15 EDT Ventricular Rate:  101 PR Interval:  160 QRS Duration: 94 QT Interval:  342 QTC Calculation: 443 R Axis:   15 Text Interpretation: Sinus tachycardia Nonspecific ST and T wave abnormality Abnormal ECG No significant change since last tracing Confirmed by Dorie Rank [phone]) on 10/03/2019 6:32:41 PM   Radiology CT ABDOMEN PELVIS WO CONTRAST  Result Date: 10/03/2019 CLINICAL DATA:  64 year old male with pelvic pain. Concern for acute diverticulitis. EXAM: CT ABDOMEN AND PELVIS WITHOUT CONTRAST TECHNIQUE: Multidetector CT imaging of the abdomen and pelvis was performed following the standard protocol without IV contrast. COMPARISON:  CT abdomen pelvis dated 09/23/2014. FINDINGS: Evaluation of this exam is limited in the absence of intravenous contrast. Lower chest: The visualized lung bases are clear. No intra-abdominal free air. Small free fluid in the pelvis. Hepatobiliary: No focal liver abnormality is seen. No gallstones, gallbladder wall thickening, or biliary dilatation.  Pancreas: Unremarkable. No pancreatic ductal dilatation or surrounding inflammatory changes. Spleen: Normal in size without focal abnormality. Adrenals/Urinary Tract: The right adrenal gland is unremarkable. The left adrenal gland is not visualized. Postsurgical changes of prior left nephrectomy. The right kidney is unremarkable. The right ureter and urinary bladder appear unremarkable as well. Stomach/Bowel: There is sigmoid diverticulosis and scattered colonic diverticula. There is mild stranding adjacent to the sigmoid colon with small amount of fluid in the left paracolic gutter suspicious for mild acute diverticulitis. Clinical correlation is recommended. No diverticular abscess or perforation. There is no bowel obstruction. The appendix is normal. Vascular/Lymphatic: Moderate aortoiliac atherosclerotic disease. The IVC is unremarkable. No portal venous gas. There is no adenopathy. Reproductive: The prostate and seminal vesicles are grossly unremarkable. Other: Small fat containing umbilical hernia. Musculoskeletal: Degenerative changes of the spine. No acute osseous pathology. IMPRESSION: 1. Sigmoid diverticulitis. No diverticular abscess or perforation. 2. Prior left nephrectomy.  No evidence of recurrent disease. 3.  Aortic Atherosclerosis (ICD10-I70.0). Electronically Signed   By: Anner Crete M.D.   On: 10/03/2019 20:28   DG Chest 2 View  Result Date: 10/03/2019 CLINICAL DATA:  Shortness of breath, pelvic pain, nausea, diarrhea, belching, onset of symptoms yesterday EXAM: CHEST - 2 VIEW COMPARISON:  12/09/2017 FINDINGS: Normal heart size and pulmonary vascularity. Tortuous thoracic aorta. Lungs clear. No pulmonary infiltrate, pleural effusion, or pneumothorax. Multilevel endplate spur formation thoracic spine. IMPRESSION: No acute abnormalities. Electronically Signed   By: Lavonia Dana M.D.   On: 10/03/2019 15:03    Procedures Procedures (including critical care time)  Medications Ordered in  ED Medications  morphine 4 MG/ML injection 4 mg (4 mg Intravenous Not Given 10/03/19 1906)  ondansetron (ZOFRAN) injection 4 mg (4 mg Intravenous Not Given 10/03/19 1906)  sodium chloride 0.9 % bolus 1,000 mL (0 mLs Intravenous Stopped 10/03/19 2010)    Followed by  0.9 %  sodium  chloride infusion (0 mLs Intravenous Hold 10/03/19 2010)  iohexol (OMNIPAQUE) 9 MG/ML oral solution 500 mL (0 mLs Oral Hold 10/03/19 2018)  amoxicillin-clavulanate (AUGMENTIN) 500-125 MG per tablet 500 mg (has no administration in time range)  sodium chloride flush (NS) 0.9 % injection 3 mL (3 mLs Intravenous Given 10/03/19 1907)    ED Course  I have reviewed the triage vital signs and the nursing notes.  Pertinent labs & imaging results that were available during my care of the patient were reviewed by me and considered in my medical decision making (see chart for details).  Clinical Course as of Oct 02 2109  Nancy Fetter Oct 03, 2019  2057 CT scan shows sigmoid diverticulitis.  No evidence of abscess or perforation   [JK]  2111 Labs reassuring.  Renal function is stable.  CBC is normal.   [JK]    Clinical Course User Index [JK] Dorie Rank, MD   MDM Rules/Calculators/A&P                          Patient presented to the ED for evaluation of abdominal pain.  Patient's laboratory tests and vital signs were reassuring however he did have lower abdominal tenderness.  Patient did have history of prior nephrectomy for renal cell carcinoma.  I was concerned about the possibility of diverticulitis, colitis or obstruction.  CT scanning was performed and it shows evidence of diverticulitis without any complications.  Patient has remained stable.  He wants something to eat and drink at this point.  Plan on discharge home with course of Augmentin.  Will dose appropriately for his renal function.  Discussed importance of close outpatient follow-up as well as warning signs and precautions. Final Clinical Impression(s) / ED  Diagnoses Final diagnoses:  Diverticulitis    Rx / DC Orders ED Discharge Orders         Ordered    ondansetron (ZOFRAN ODT) 4 MG disintegrating tablet  Every 8 hours PRN     Discontinue  Reprint     10/03/19 2110    amoxicillin-clavulanate (AUGMENTIN) 500-125 MG tablet  2 times daily     Discontinue  Reprint     10/03/19 2110           Dorie Rank, MD 10/03/19 2111

## 2019-10-03 NOTE — ED Notes (Addendum)
Pt declines CT oral contrast, provider at bedside to provide council about IV vs oral contrast options, pt continues to decline. CT called and updated about plan for no contrast. Transported to CT

## 2019-10-03 NOTE — ED Notes (Signed)
Patient verbalizes understanding of discharge instructions. Opportunity for questioning and answers were provided. Armband removed by staff, pt discharged from ED to home 

## 2019-10-03 NOTE — Discharge Instructions (Addendum)
Take the antibiotics as prescribed.  Take Zofran as needed for nausea.  Follow-up with your primary care doctor to make sure you are improving.  Return to the ED for trouble with fever vomiting increasing pain

## 2019-10-03 NOTE — ED Triage Notes (Signed)
Reports pain to pelvic area, belching, nausea, diarrhea, and SOB since yesterday.  Denies chest pain.  Denies urinary complaints.

## 2019-10-03 NOTE — ED Notes (Signed)
Morphine and zofran not given, wasted with Erma Heritage, RN , pt name not in pyxis

## 2019-12-28 ENCOUNTER — Ambulatory Visit: Payer: Medicare Other | Attending: Internal Medicine

## 2019-12-28 DIAGNOSIS — Z23 Encounter for immunization: Secondary | ICD-10-CM

## 2019-12-28 NOTE — Progress Notes (Signed)
   Covid-19 Vaccination Clinic  Name:  Brian Lawson    MRN: [id number] DOB: 01/16/1956  12/28/2019  [name] was observed post Covid-19 immunization for 15 minutes without incident. He was provided with Vaccine Information Sheet and instruction to access the V-Safe system.   [name] was instructed to call 911 with any severe reactions post vaccine: Marland Kitchen Difficulty breathing  . Swelling of face and throat  . A fast heartbeat  . A bad rash all over body  . Dizziness and weakness

## 2020-02-29 DIAGNOSIS — Z1212 Encounter for screening for malignant neoplasm of rectum: Secondary | ICD-10-CM | POA: Diagnosis not present

## 2020-03-10 ENCOUNTER — Emergency Department (HOSPITAL_COMMUNITY)
Admission: EM | Admit: 2020-03-10 | Discharge: 2020-03-11 | Disposition: A | Discharge status: Home / Self Care | Payer: Medicare Other | Attending: Emergency Medicine | Admitting: Emergency Medicine

## 2020-03-10 DIAGNOSIS — Z87891 Personal history of nicotine dependence: Secondary | ICD-10-CM | POA: Insufficient documentation

## 2020-03-10 DIAGNOSIS — I129 Hypertensive chronic kidney disease with stage 1 through stage 4 chronic kidney disease, or unspecified chronic kidney disease: Secondary | ICD-10-CM | POA: Insufficient documentation

## 2020-03-10 DIAGNOSIS — R1030 Lower abdominal pain, unspecified: Secondary | ICD-10-CM | POA: Diagnosis present

## 2020-03-10 DIAGNOSIS — Z859 Personal history of malignant neoplasm, unspecified: Secondary | ICD-10-CM | POA: Insufficient documentation

## 2020-03-10 DIAGNOSIS — R112 Nausea with vomiting, unspecified: Secondary | ICD-10-CM | POA: Diagnosis not present

## 2020-03-10 DIAGNOSIS — Z79899 Other long term (current) drug therapy: Secondary | ICD-10-CM | POA: Diagnosis not present

## 2020-03-10 DIAGNOSIS — N189 Chronic kidney disease, unspecified: Secondary | ICD-10-CM | POA: Insufficient documentation

## 2020-03-10 DIAGNOSIS — K529 Noninfective gastroenteritis and colitis, unspecified: Secondary | ICD-10-CM | POA: Diagnosis not present

## 2020-03-10 LAB — COMPREHENSIVE METABOLIC PANEL
ALT: 14 U/L (ref 0–44)
AST: 20 U/L (ref 15–41)
Albumin: 3.5 g/dL (ref 3.5–5.0)
Alkaline Phosphatase: 63 U/L (ref 38–126)
Anion gap: 12 (ref 5–15)
BUN: 30 mg/dL — ABNORMAL HIGH (ref 8–23)
CO2: 24 mmol/L (ref 22–32)
Calcium: 9.6 mg/dL (ref 8.9–10.3)
Chloride: 103 mmol/L (ref 98–111)
Creatinine, Ser: 3.04 mg/dL — ABNORMAL HIGH (ref 0.61–1.24)
GFR, Estimated: 22 mL/min — ABNORMAL LOW (ref >60)
Glucose, Bld: 97 mg/dL (ref 70–99)
Potassium: 4.1 mmol/L (ref 3.5–5.1)
Sodium: 139 mmol/L (ref 135–145)
Total Bilirubin: 0.7 mg/dL (ref 0.3–1.2)
Total Protein: 7.6 g/dL (ref 6.5–8.1)

## 2020-03-10 LAB — CBC
HCT: 40.5 % (ref 39.0–52.0)
Hemoglobin: 12.7 g/dL — ABNORMAL LOW (ref 13.0–17.0)
MCH: 28 pg (ref 26.0–34.0)
MCHC: 31.4 g/dL (ref 30.0–36.0)
MCV: 89.4 fL (ref 80.0–100.0)
Platelets: 204 10*3/uL (ref 150–400)
RBC: 4.53 MIL/uL (ref 4.22–5.81)
RDW: 16.7 % — ABNORMAL HIGH (ref 11.5–15.5)
WBC: 8.7 10*3/uL (ref 4.0–10.5)
nRBC: 0 % (ref 0.0–0.2)

## 2020-03-10 LAB — LIPASE, BLOOD: Lipase: 31 U/L (ref 11–51)

## 2020-03-10 MED ORDER — ACETAMINOPHEN 325 MG PO TABS
650.0000 mg | ORAL_TABLET | Freq: Once | ORAL | Status: AC
  Administered 2020-03-10: 650 mg via ORAL
  Filled 2020-03-10: qty 2

## 2020-03-10 NOTE — ED Triage Notes (Signed)
Pt arrives via EMS from home with sudden onset of periumilical pain this morning. Has had 1 episode of vomiting this morning. Not able to eat much today. VSS in transport.

## 2020-03-11 ENCOUNTER — Emergency Department (HOSPITAL_COMMUNITY): Payer: Medicare Other

## 2020-03-11 ENCOUNTER — Other Ambulatory Visit: Payer: Self-pay

## 2020-03-11 DIAGNOSIS — Z79899 Other long term (current) drug therapy: Secondary | ICD-10-CM | POA: Diagnosis not present

## 2020-03-11 DIAGNOSIS — Z859 Personal history of malignant neoplasm, unspecified: Secondary | ICD-10-CM | POA: Diagnosis not present

## 2020-03-11 DIAGNOSIS — R112 Nausea with vomiting, unspecified: Secondary | ICD-10-CM | POA: Diagnosis not present

## 2020-03-11 DIAGNOSIS — N189 Chronic kidney disease, unspecified: Secondary | ICD-10-CM | POA: Diagnosis not present

## 2020-03-11 DIAGNOSIS — K529 Noninfective gastroenteritis and colitis, unspecified: Secondary | ICD-10-CM | POA: Diagnosis not present

## 2020-03-11 DIAGNOSIS — Z87891 Personal history of nicotine dependence: Secondary | ICD-10-CM | POA: Diagnosis not present

## 2020-03-11 DIAGNOSIS — I129 Hypertensive chronic kidney disease with stage 1 through stage 4 chronic kidney disease, or unspecified chronic kidney disease: Secondary | ICD-10-CM | POA: Diagnosis not present

## 2020-03-11 DIAGNOSIS — R1030 Lower abdominal pain, unspecified: Secondary | ICD-10-CM | POA: Diagnosis present

## 2020-03-11 LAB — URINALYSIS, ROUTINE W REFLEX MICROSCOPIC
Bacteria, UA: NONE SEEN
Bilirubin Urine: NEGATIVE
Glucose, UA: NEGATIVE mg/dL
Hgb urine dipstick: NEGATIVE
Ketones, ur: NEGATIVE mg/dL
Leukocytes,Ua: NEGATIVE
Nitrite: NEGATIVE
Protein, ur: 30 mg/dL — AB
Specific Gravity, Urine: 1.018 (ref 1.005–1.030)
pH: 5 (ref 5.0–8.0)

## 2020-03-11 MED ORDER — SODIUM CHLORIDE 0.9 % IV BOLUS (SEPSIS)
1000.0000 mL | Freq: Once | INTRAVENOUS | Status: AC
  Administered 2020-03-11: 1000 mL via INTRAVENOUS

## 2020-03-11 MED ORDER — AMOXICILLIN-POT CLAVULANATE 875-125 MG PO TABS
1.0000 | ORAL_TABLET | Freq: Two times a day (BID) | ORAL | 0 refills | Status: DC
Start: 2020-03-11 — End: 2021-10-10

## 2020-03-11 MED ORDER — ACETAMINOPHEN 500 MG PO TABS: 1000.0000 mg | ORAL_TABLET | Freq: Once | ORAL | Status: DC

## 2020-03-11 MED ORDER — ONDANSETRON HCL 4 MG/2ML IJ SOLN
4.0000 mg | Freq: Once | INTRAMUSCULAR | Status: AC
  Administered 2020-03-11: 4 mg via INTRAVENOUS
  Filled 2020-03-11: qty 2

## 2020-03-11 MED ORDER — PROBIOTIC (LACTOBACILLUS) PO CAPS: 1.0000 | ORAL_CAPSULE | Freq: Every day | ORAL | 0 refills | Status: AC

## 2020-03-11 MED ORDER — DOCUSATE SODIUM 100 MG PO CAPS
100.0000 mg | ORAL_CAPSULE | Freq: Two times a day (BID) | ORAL | 0 refills | Status: DC
Start: 2020-03-11 — End: 2021-11-19

## 2020-03-11 MED ORDER — HYDROCODONE-ACETAMINOPHEN 5-325 MG PO TABS: 1.0000 | ORAL_TABLET | ORAL | 0 refills | Status: DC | PRN

## 2020-03-11 MED ORDER — AMOXICILLIN-POT CLAVULANATE 875-125 MG PO TABS
1.0000 | ORAL_TABLET | Freq: Once | ORAL | Status: AC
  Administered 2020-03-11: 1 via ORAL
  Filled 2020-03-11: qty 1

## 2020-03-11 MED ORDER — MORPHINE SULFATE (PF) 4 MG/ML IV SOLN
4.0000 mg | Freq: Once | INTRAVENOUS | Status: AC
  Administered 2020-03-11: 4 mg via INTRAVENOUS
  Filled 2020-03-11: qty 1

## 2020-03-11 MED ORDER — ONDANSETRON 4 MG PO TBDP: 4.0000 mg | ORAL_TABLET | Freq: Four times a day (QID) | ORAL | 0 refills | Status: DC | PRN

## 2020-03-11 NOTE — ED Provider Notes (Signed)
TIME SEEN: 4:06 AM  CHIEF COMPLAINT: Abdominal pain  HPI: Patient is a 65 year old male with history of chronic kidney disease, hypertension who presents to the emergency department with sharp lower abdominal pain x 1 day. Intermittent.  No a/a factors.  Has had n/v x 1.  No diarrhea.  No bloody stools or melena. Feels like diverticulits. H/o r nephrectomy d/t cancer. + 3 vaccines for COVID 19.  Found to be febrile here.  No cough, sore throat, body aches, chest pain or shortness of breath.  ROS: See HPI Constitutional:  fever  Eyes: no drainage  ENT: no runny nose   Cardiovascular:  no chest pain  Resp: no SOB  GI: Vomiting and diarrhea GU: no dysuria Integumentary: no rash  Allergy: no hives  Musculoskeletal: no leg swelling  Neurological: no slurred speech ROS otherwise negative  PAST MEDICAL HISTORY/PAST SURGICAL HISTORY:  Past Medical History:  Diagnosis Date  . Anemia associated with chronic renal failure   . Arthritis    OA  . Cancer [facility] renal lt nephrectomy  . Chronic kidney disease    ACUTE RENAL FAILURE 12/2012 STATUS POST RT ARM AV FISTULA - HAS NOT STARTED DIALYSIS; LEFT RENAL MASS  . Gout   . Hepatitis    30 years ago  . Hypertension   . Pneumonia    HOSPITALIZED 12/2012 LEGIONLLA PNEUMONIA    MEDICATIONS:  Prior to Admission medications   Medication Sig Start Date End Date Taking? Authorizing Provider  allopurinol (ZYLOPRIM) 300 MG tablet Take 300 mg by mouth daily.    [provider]  calcium acetate (PHOSLO) 667 MG capsule Take 1,334 mg by mouth 3 (three) times daily with meals. 01/25/13   Viyuoh, Alison Stalling, MD  cholecalciferol (VITAMIN D) 1000 UNITS tablet Take 1,000 Units by mouth daily.    [provider]  cloNIDine (CATAPRES) 0.2 MG tablet Take 0.2 mg by mouth 3 (three) times daily. 01/25/13   Viyuoh, Alison Stalling, MD  docusate sodium (COLACE) 100 MG capsule Take 100 mg by mouth 2 (two) times daily.    [provider]   furosemide (LASIX) 40 MG tablet Take 40 mg by mouth daily.    [provider]  hydrALAZINE (APRESOLINE) 50 MG tablet Take 50 mg by mouth 3 (three) times daily. Patient states he takes a 25mg  TID as well per patient    [provider]  omeprazole (PRILOSEC) 40 MG capsule Take 40 mg by mouth every morning.  01/25/13   Viyuoh, Alison Stalling, MD  ondansetron (ZOFRAN ODT) 4 MG disintegrating tablet Take 1 tablet (4 mg total) by mouth every 8 (eight) hours as needed for nausea or vomiting. 10/03/19   Dorie Rank, MD  predniSONE (DELTASONE) 10 MG tablet Take 10 mg by mouth daily with breakfast.    [provider]  sodium bicarbonate 650 MG tablet Take 650 mg by mouth 2 (two) times daily. 01/25/13   Sheila Oats, MD    ALLERGIES:  No Known Allergies  SOCIAL HISTORY:  Social History   Tobacco Use  . Smoking status: Former Research scientist (life sciences)  . Smokeless tobacco: Never Used  Substance Use Topics  . Alcohol use: Yes    Comment: ocassionally  QUIT SMOKING IN 2014    FAMILY HISTORY: Family History  Problem Relation Age of Onset  . Hypertension Mother   . Cancer Father   . Hypertension Sister     EXAM: BP (!) 149/93 (BP Location: Right Arm)   Pulse 96  Temp 98.5 F (36.9 C) (Oral)   Resp (!) 28   SpO2 95%  CONSTITUTIONAL: Alert and oriented and responds appropriately to questions. Well-appearing; well-nourished HEAD: Normocephalic EYES: Conjunctivae clear, pupils appear equal, EOM appear intact ENT: normal nose; moist mucous membranes NECK: Supple, normal ROM CARD: RRR; S1 and S2 appreciated; no murmurs, no clicks, no rubs, no gallops RESP: Normal chest excursion without splinting or tachypnea; breath sounds clear and equal bilaterally; no wheezes, no rhonchi, no rales, no hypoxia or respiratory distress, speaking full sentences ABD/GI: Normal bowel sounds; non-distended; soft, diffusely tender throughout the lower abdomen without guarding or rebound BACK:  The back  appears normal EXT: Normal ROM in all joints; no deformity noted, no edema; no cyanosis SKIN: Normal color for age and race; warm; no rash on exposed skin NEURO: Moves all extremities equally PSYCH: The patient's mood and manner are appropriate.   MEDICAL DECISION MAKING: Patient here with complaints of lower abdominal pain.  History of diverticulitis and states this feels similar.  Found to be febrile here.  Labs show no acute abnormality today.  Will obtain CT of the abdomen pelvis for further evaluation.  Differential includes diverticulitis, colitis, SBO.  Doubt perforation.  UTI, kidney stone also on the differential.  Urinalysis pending.  ED PROGRESS: CT scan shows nonspecific colitis in the descending colon.  He denies having any diarrhea.  No recent sick contact, antibiotic use, hospitalization.  No history of C. difficile.  Low suspicion for C. difficile colitis.  Imaging suggestive of constipation.  He reports feeling much better.  Discussed admission versus outpatient management.  He would prefer to go home.  Will discharge with Augmentin, probiotics, pain and nausea medicine.  Will start bowel regimen as well given he is constipated on CT imaging.   Urine shows no sign of infection.  Patient tolerating p.o.  Will discharge home.   At this time, I do not feel there is any life-threatening condition present. I have reviewed, interpreted and discussed all results (EKG, imaging, lab, urine as appropriate) and exam findings with patient/family. I have reviewed nursing notes and appropriate previous records.  I feel the patient is safe to be discharged home without further emergent workup and can continue workup as an outpatient as needed. Discussed usual and customary return precautions. Patient/family verbalize understanding and are comfortable with this plan.  Outpatient follow-up has been provided as needed. All questions have been answered.     Brian Lawson was evaluated in Emergency  Department on 03/11/2020 for the symptoms described in the history of present illness. He was evaluated in the context of the global COVID-19 pandemic, which necessitated consideration that the patient might be at risk for infection with the SARS-CoV-2 virus that causes COVID-19. Institutional protocols and algorithms that pertain to the evaluation of patients at risk for COVID-19 are in a state of rapid change based on information released by regulatory bodies including the CDC and federal and state organizations. These policies and algorithms were followed during the patient's care in the ED.      Kourtnee Lahey, Delice Bison, DO 03/11/20 [phone]

## 2020-03-11 NOTE — Discharge Instructions (Signed)
Your labs today were reassuring.  Your CT scan showed colitis.  Your CT also was concerning for constipation.  You may use over-the-counter medication such as Colace and MiraLAX to help with constipation. Take your antibiotics until complete.  Please take probiotics while on antibiotics.  If your symptoms worsen he began having severe abdominal pain that is uncontrolled medication at home, vomiting cannot stop, blood in your stools or black tarry stools, significant increase in diarrhea that is causing you to be weak, lightheaded or pass out, please return to the emergency department.

## 2020-08-01 DIAGNOSIS — R634 Abnormal weight loss: Secondary | ICD-10-CM | POA: Diagnosis not present

## 2020-08-01 DIAGNOSIS — I10 Essential (primary) hypertension: Secondary | ICD-10-CM | POA: Diagnosis not present

## 2020-08-01 DIAGNOSIS — D649 Anemia, unspecified: Secondary | ICD-10-CM | POA: Diagnosis not present

## 2020-08-01 DIAGNOSIS — E785 Hyperlipidemia, unspecified: Secondary | ICD-10-CM | POA: Diagnosis not present

## 2020-08-01 DIAGNOSIS — N184 Chronic kidney disease, stage 4 (severe): Secondary | ICD-10-CM | POA: Diagnosis not present

## 2020-08-01 DIAGNOSIS — F419 Anxiety disorder, unspecified: Secondary | ICD-10-CM | POA: Diagnosis not present

## 2020-08-01 DIAGNOSIS — Z85528 Personal history of other malignant neoplasm of kidney: Secondary | ICD-10-CM | POA: Diagnosis not present

## 2020-08-01 DIAGNOSIS — Z23 Encounter for immunization: Secondary | ICD-10-CM | POA: Diagnosis not present

## 2020-08-01 DIAGNOSIS — Z905 Acquired absence of kidney: Secondary | ICD-10-CM | POA: Diagnosis not present

## 2020-08-01 DIAGNOSIS — Z7952 Long term (current) use of systemic steroids: Secondary | ICD-10-CM | POA: Diagnosis not present

## 2020-08-03 DIAGNOSIS — M109 Gout, unspecified: Secondary | ICD-10-CM | POA: Diagnosis not present

## 2020-08-03 DIAGNOSIS — N184 Chronic kidney disease, stage 4 (severe): Secondary | ICD-10-CM | POA: Diagnosis not present

## 2020-08-03 DIAGNOSIS — D509 Iron deficiency anemia, unspecified: Secondary | ICD-10-CM | POA: Diagnosis not present

## 2020-08-03 DIAGNOSIS — N189 Chronic kidney disease, unspecified: Secondary | ICD-10-CM | POA: Diagnosis not present

## 2020-08-03 DIAGNOSIS — I129 Hypertensive chronic kidney disease with stage 1 through stage 4 chronic kidney disease, or unspecified chronic kidney disease: Secondary | ICD-10-CM | POA: Diagnosis not present

## 2020-08-03 DIAGNOSIS — Z905 Acquired absence of kidney: Secondary | ICD-10-CM | POA: Diagnosis not present

## 2020-08-03 DIAGNOSIS — D631 Anemia in chronic kidney disease: Secondary | ICD-10-CM | POA: Diagnosis not present

## 2020-08-03 DIAGNOSIS — N2581 Secondary hyperparathyroidism of renal origin: Secondary | ICD-10-CM | POA: Diagnosis not present

## 2020-12-07 DIAGNOSIS — Z23 Encounter for immunization: Secondary | ICD-10-CM | POA: Diagnosis not present

## 2021-01-23 DIAGNOSIS — M109 Gout, unspecified: Secondary | ICD-10-CM | POA: Diagnosis not present

## 2021-01-23 DIAGNOSIS — Z125 Encounter for screening for malignant neoplasm of prostate: Secondary | ICD-10-CM | POA: Diagnosis not present

## 2021-01-23 DIAGNOSIS — I10 Essential (primary) hypertension: Secondary | ICD-10-CM | POA: Diagnosis not present

## 2021-01-23 DIAGNOSIS — E785 Hyperlipidemia, unspecified: Secondary | ICD-10-CM | POA: Diagnosis not present

## 2021-01-26 DIAGNOSIS — D649 Anemia, unspecified: Secondary | ICD-10-CM | POA: Diagnosis not present

## 2021-01-26 DIAGNOSIS — M609 Myositis, unspecified: Secondary | ICD-10-CM | POA: Diagnosis not present

## 2021-01-26 DIAGNOSIS — Z7952 Long term (current) use of systemic steroids: Secondary | ICD-10-CM | POA: Diagnosis not present

## 2021-01-26 DIAGNOSIS — R82998 Other abnormal findings in urine: Secondary | ICD-10-CM | POA: Diagnosis not present

## 2021-01-26 DIAGNOSIS — E785 Hyperlipidemia, unspecified: Secondary | ICD-10-CM | POA: Diagnosis not present

## 2021-01-26 DIAGNOSIS — N2581 Secondary hyperparathyroidism of renal origin: Secondary | ICD-10-CM | POA: Diagnosis not present

## 2021-01-26 DIAGNOSIS — R634 Abnormal weight loss: Secondary | ICD-10-CM | POA: Diagnosis not present

## 2021-01-26 DIAGNOSIS — K5900 Constipation, unspecified: Secondary | ICD-10-CM | POA: Diagnosis not present

## 2021-01-26 DIAGNOSIS — D721 Eosinophilia, unspecified: Secondary | ICD-10-CM | POA: Diagnosis not present

## 2021-01-26 DIAGNOSIS — N184 Chronic kidney disease, stage 4 (severe): Secondary | ICD-10-CM | POA: Diagnosis not present

## 2021-01-26 DIAGNOSIS — Z0000 Encounter for general adult medical examination without abnormal findings: Secondary | ICD-10-CM | POA: Diagnosis not present

## 2021-01-26 DIAGNOSIS — I10 Essential (primary) hypertension: Secondary | ICD-10-CM | POA: Diagnosis not present

## 2021-01-26 DIAGNOSIS — Z85528 Personal history of other malignant neoplasm of kidney: Secondary | ICD-10-CM | POA: Diagnosis not present

## 2021-01-31 DIAGNOSIS — D631 Anemia in chronic kidney disease: Secondary | ICD-10-CM | POA: Diagnosis not present

## 2021-01-31 DIAGNOSIS — N184 Chronic kidney disease, stage 4 (severe): Secondary | ICD-10-CM | POA: Diagnosis not present

## 2021-01-31 DIAGNOSIS — Z905 Acquired absence of kidney: Secondary | ICD-10-CM | POA: Diagnosis not present

## 2021-01-31 DIAGNOSIS — N2581 Secondary hyperparathyroidism of renal origin: Secondary | ICD-10-CM | POA: Diagnosis not present

## 2021-01-31 DIAGNOSIS — D509 Iron deficiency anemia, unspecified: Secondary | ICD-10-CM | POA: Diagnosis not present

## 2021-01-31 DIAGNOSIS — M109 Gout, unspecified: Secondary | ICD-10-CM | POA: Diagnosis not present

## 2021-01-31 DIAGNOSIS — I129 Hypertensive chronic kidney disease with stage 1 through stage 4 chronic kidney disease, or unspecified chronic kidney disease: Secondary | ICD-10-CM | POA: Diagnosis not present

## 2021-06-19 ENCOUNTER — Encounter: Payer: Self-pay | Admitting: Internal Medicine

## 2021-07-17 DIAGNOSIS — Z905 Acquired absence of kidney: Secondary | ICD-10-CM | POA: Diagnosis not present

## 2021-07-17 DIAGNOSIS — E872 Acidosis, unspecified: Secondary | ICD-10-CM | POA: Diagnosis not present

## 2021-07-17 DIAGNOSIS — N189 Chronic kidney disease, unspecified: Secondary | ICD-10-CM | POA: Diagnosis not present

## 2021-07-17 DIAGNOSIS — D631 Anemia in chronic kidney disease: Secondary | ICD-10-CM | POA: Diagnosis not present

## 2021-07-17 DIAGNOSIS — I129 Hypertensive chronic kidney disease with stage 1 through stage 4 chronic kidney disease, or unspecified chronic kidney disease: Secondary | ICD-10-CM | POA: Diagnosis not present

## 2021-07-17 DIAGNOSIS — N2581 Secondary hyperparathyroidism of renal origin: Secondary | ICD-10-CM | POA: Diagnosis not present

## 2021-07-17 DIAGNOSIS — N184 Chronic kidney disease, stage 4 (severe): Secondary | ICD-10-CM | POA: Diagnosis not present

## 2021-07-17 DIAGNOSIS — N186 End stage renal disease: Secondary | ICD-10-CM | POA: Diagnosis not present

## 2021-07-17 DIAGNOSIS — I770 Arteriovenous fistula, acquired: Secondary | ICD-10-CM | POA: Diagnosis not present

## 2021-08-02 DIAGNOSIS — Z7952 Long term (current) use of systemic steroids: Secondary | ICD-10-CM | POA: Diagnosis not present

## 2021-08-02 DIAGNOSIS — R7303 Prediabetes: Secondary | ICD-10-CM | POA: Diagnosis not present

## 2021-08-02 DIAGNOSIS — M609 Myositis, unspecified: Secondary | ICD-10-CM | POA: Diagnosis not present

## 2021-08-02 DIAGNOSIS — N184 Chronic kidney disease, stage 4 (severe): Secondary | ICD-10-CM | POA: Diagnosis not present

## 2021-08-02 DIAGNOSIS — B181 Chronic viral hepatitis B without delta-agent: Secondary | ICD-10-CM | POA: Diagnosis not present

## 2021-08-02 DIAGNOSIS — R634 Abnormal weight loss: Secondary | ICD-10-CM | POA: Diagnosis not present

## 2021-08-02 DIAGNOSIS — D649 Anemia, unspecified: Secondary | ICD-10-CM | POA: Diagnosis not present

## 2021-08-02 DIAGNOSIS — N2581 Secondary hyperparathyroidism of renal origin: Secondary | ICD-10-CM | POA: Diagnosis not present

## 2021-08-02 DIAGNOSIS — I10 Essential (primary) hypertension: Secondary | ICD-10-CM | POA: Diagnosis not present

## 2021-10-10 ENCOUNTER — Ambulatory Visit (HOSPITAL_COMMUNITY)
Admission: RE | Admit: 2021-10-10 | Discharge: 2021-10-10 | Disposition: A | Discharge status: Home / Self Care | Payer: Medicare HMO | Source: Ambulatory Visit | Attending: Family Medicine | Admitting: Family Medicine

## 2021-10-10 ENCOUNTER — Encounter (HOSPITAL_COMMUNITY): Payer: Self-pay

## 2021-10-10 VITALS — BP 95/63 | HR 108 | Temp 100.0°F | Resp 18 | Ht 75.0 in | Wt 187.0 lb

## 2021-10-10 DIAGNOSIS — R509 Fever, unspecified: Secondary | ICD-10-CM | POA: Diagnosis not present

## 2021-10-10 DIAGNOSIS — J029 Acute pharyngitis, unspecified: Secondary | ICD-10-CM | POA: Diagnosis not present

## 2021-10-10 LAB — POCT RAPID STREP A, ED / UC: Streptococcus, Group A Screen (Direct): NEGATIVE

## 2021-10-10 MED ORDER — AMOXICILLIN 875 MG PO TABS: 875.0000 mg | ORAL_TABLET | Freq: Two times a day (BID) | ORAL | 0 refills | Status: AC

## 2021-10-10 MED ORDER — LIDOCAINE VISCOUS HCL 2 % MT SOLN: 10.0000 mL | Freq: Four times a day (QID) | OROMUCOSAL | 0 refills | Status: DC | PRN

## 2021-10-10 NOTE — Discharge Instructions (Signed)
You may use over the counter ibuprofen or acetaminophen as needed.  For a sore throat, over the counter products such as Colgate Peroxyl Mouth Sore Rinse or Chloraseptic Sore Throat Spray may provide some temporary relief. I am treating you with an antibiotic. We have sent your throat swab for culture and will let you know of any positive results.

## 2021-10-10 NOTE — ED Triage Notes (Signed)
Patient c/o sore throat and non-productive cough x 4 days, difficulty swallowing and decreased appetite.  Patient has been using Chloraseptic, throat lozenges and Tylenol for pain.

## 2021-10-10 NOTE — ED Provider Notes (Signed)
Brian Lawson   [id number] 10/10/21 Arrival Time: 3295  ASSESSMENT & PLAN:  1. Pharyngitis, unspecified etiology   2. Fever, unspecified fever cause    Will treat for strep given symptoms/appearance of throat. No signs of peritonsillar abscess. Discussed.  Meds ordered this encounter  Medications   amoxicillin (AMOXIL) 875 MG tablet    Sig: Take 1 tablet (875 mg total) by mouth 2 (two) times daily for 10 days.    Dispense:  20 tablet    Refill:  0   magic mouthwash (lidocaine, diphenhydrAMINE, alum & mag hydroxide) suspension    Sig: Swish and spit 10 mLs 4 (four) times daily as needed for mouth pain.    Dispense:  360 mL    Refill:  0    Results for orders placed or performed during the hospital encounter of 10/10/21  POCT Rapid Strep A  Result Value Ref Range   Streptococcus, Group A Screen (Direct) NEGATIVE NEGATIVE       Discharge Instructions      You may use over the counter ibuprofen or acetaminophen as needed.  For a sore throat, over the counter products such as Colgate Peroxyl Mouth Sore Rinse or Chloraseptic Sore Throat Spray may provide some temporary relief. I am treating you with an antibiotic. We have sent your throat swab for culture and will let you know of any positive results.      Reviewed expectations re: course of current medical issues. Questions answered. Outlined signs and symptoms indicating need for more acute intervention. Patient verbalized understanding. After Visit Summary given.   SUBJECTIVE:  Brian Lawson is a 66 y.o. male who reports a "severe" sore throat. Onset abrupt beginning  3 d ago . Symptoms have gradually worsened since beginning; without voice changes. No respiratory symptoms. Normal PO intake but reports discomfort with swallowing. No specific alleviating factors. Fever: believed to be present, temp not taken. No neck pain or swelling. No associated nausea, vomiting, or abdominal pain. Known sick contacts:  none. Recent travel: none. Various OTC without much relief.   OBJECTIVE:  Vitals:   10/10/21 1350 10/10/21 1352  BP: 95/63   Pulse: (!) 108   Resp: 18   Temp: 100 F (37.8 C)   TempSrc: Oral   SpO2: 100%   Weight:  84.8 kg  Height:  '6\' 3"'$  (1.905 m)    Sight tachycardia noted; regular.  General appearance: alert; no distress HEENT: throat with marked erythema and with exudative tonsillar hypertrophy; uvula is midline Neck: supple with FROM; mod bilat cervical LAD Lungs: speaks full sentences without difficulty; unlabored Abd: soft; non-tender Skin: reveals no rash; warm and dry Psychological: alert and cooperative; normal mood and affect  No Known Allergies  Past Medical History:  Diagnosis Date   Anemia associated with chronic renal failure    Arthritis    OA   Cancer ([place]) renal lt nephrectomy   Chronic kidney disease    ACUTE RENAL FAILURE 12/2012 STATUS POST RT ARM AV FISTULA - HAS NOT STARTED DIALYSIS; LEFT RENAL MASS   Gout    Hepatitis    30 years ago   Hypertension    Pneumonia    HOSPITALIZED 12/2012 LEGIONLLA PNEUMONIA   Social History   Socioeconomic History   Marital status: Single    Spouse name: Not on file   Number of children: Not on file   Years of education: Not on file   Highest education level: Not on file  Occupational History  Not on file  Tobacco Use   Smoking status: Former   Smokeless tobacco: Never  Substance and Sexual Activity   Alcohol use: Yes    Comment: ocassionally  QUIT SMOKING IN 2014   Drug use: No   Sexual activity: Not on file  Other Topics Concern   Not on file  Social History Narrative   Not on file   Social Determinants of Health   Financial Resource Strain: Not on file  Food Insecurity: Not on file  Transportation Needs: Not on file  Physical Activity: Not on file  Stress: Not on file  Social Connections: Not on file  Intimate Partner Violence: Not on file   Family History  Problem Relation Age of  Onset   Hypertension Mother    Cancer Father    Hypertension Sister            Vanessa Kick, MD 10/10/21 1500

## 2021-10-13 LAB — CULTURE, GROUP A STREP (THRC)

## 2021-10-24 ENCOUNTER — Encounter: Payer: Self-pay | Admitting: Physician Assistant

## 2021-10-24 ENCOUNTER — Ambulatory Visit: Payer: Medicare HMO | Admitting: Physician Assistant

## 2021-10-24 VITALS — BP 118/70 | HR 111 | Ht 75.0 in | Wt 183.5 lb

## 2021-10-24 DIAGNOSIS — K5900 Constipation, unspecified: Secondary | ICD-10-CM | POA: Diagnosis not present

## 2021-10-24 DIAGNOSIS — Z905 Acquired absence of kidney: Secondary | ICD-10-CM | POA: Diagnosis not present

## 2021-10-24 DIAGNOSIS — R194 Change in bowel habit: Secondary | ICD-10-CM

## 2021-10-24 MED ORDER — NA SULFATE-K SULFATE-MG SULF 17.5-3.13-1.6 GM/177ML PO SOLN: 1.0000 | Freq: Once | ORAL | 0 refills | Status: AC

## 2021-10-24 NOTE — Patient Instructions (Signed)
We have sent the following medications to your pharmacy for you to pick up at your convenience: Linzess 72 mcg daily before breakfast.  You have been scheduled for a colonoscopy. Please follow written instructions given to you at your visit today.  Please pick up your prep supplies at the pharmacy within the next 1-3 days. If you use inhalers (even only as needed), please bring them with you on the day of your procedure.  _______________________________________________________  If you are age 22 or older, your body mass index should be between 23-30. Your Body mass index is 22.94 kg/m. If this is out of the aforementioned range listed, please consider follow up with your Primary Care Provider.  If you are age 47 or younger, your body mass index should be between 19-25. Your Body mass index is 22.94 kg/m. If this is out of the aformentioned range listed, please consider follow up with your Primary Care Provider.   ________________________________________________________  The [facility] providers would like to encourage you to use [facility] to communicate with providers for non-urgent requests or questions.  Due to long hold times on the telephone, sending your provider a message by [facility] may be a faster and more efficient way to get a response.  Please allow 48 business hours for a response.  Please remember that this is for non-urgent requests.  _______________________________________________________

## 2021-10-24 NOTE — Progress Notes (Signed)
Chief Complaint: Constipation  HPI:  Brian Lawson is a 66 year old African-American male, known to Dr. Hilarie Fredrickson, with a past medical history of CKD and others listed below, who was referred to me by Sueanne Margarita, DO for a complaint of constipation.      09/23/2014 colonoscopy done for hematochezia with moderate diverticulosis in the ascending colon and at the hepatic flexure without active bleeding, though this is presumed to be the source.  Repeat recommended in 10 years.    03/11/2020 CT the abdomen pelvis without contrast showed relatively long segment of nonspecific colitis in the descending colon.  Also diffuse mild colonic diverticulosis.  Also diffuse large colonic stool volume suggesting constipation, left nephrectomy and coronary atherosclerosis.  At that time was treated with Augmentin and probiotics.    08/02/2021 patient followed with PCP and discussed that he was still having constipation with a bowel movement maybe once or twice a week.  MiraLAX it made him nauseous and Dulcolax was ineffective.    Today, the patient tells me that in 2015 he had a left nephrectomy for renal mass and since then really he has struggled with constipation but this has gotten much worse over the past 6 months or so.  Describes that he tried 1 dose of MiraLAX but had nausea and vomiting for 3 days so he stopped this.  He has also tried 200 mg of a stool softener daily which did nothing.  Describes that he only has a bowel movement about once a week and has a lot of straining and oftentimes it is "pebbles" and it feels like it is very painful.  Tells me "something must be going on in there".  Denies any blood in his stool.    Denies fever, chills, weight loss or symptoms that awaken him from sleep.  Past Medical History:  Diagnosis Date   Anemia associated with chronic renal failure    Arthritis    OA   Cancer ([place]) renal lt nephrectomy   Chronic kidney disease    ACUTE RENAL FAILURE 12/2012 STATUS POST RT ARM  AV FISTULA - HAS NOT STARTED DIALYSIS; LEFT RENAL MASS   Gout    Hepatitis    30 years ago   Hypertension    Pneumonia    HOSPITALIZED 12/2012 LEGIONLLA PNEUMONIA    Past Surgical History:  Procedure Laterality Date   BASCILIC VEIN TRANSPOSITION Right 01/18/2013   Procedure: BASCILIC VEIN TRANSPOSITION;  Surgeon: Elam Dutch, MD;  Location: [place];  Service: Vascular;  Laterality: Right;   BASCILIC VEIN TRANSPOSITION Right 04/27/2013   Procedure: BASCILIC VEIN TRANSPOSITION AND REVISION;  Surgeon: Elam Dutch, MD;  Location: [place];  Service: Vascular;  Laterality: Right;   COLONOSCOPY N/A 09/23/2014   Procedure: COLONOSCOPY;  Surgeon: Jerene Bears, MD;  Location: [place] [facility];  Service: Endoscopy;  Laterality: N/A;   HERNIA REPAIR     LAPAROSCOPIC NEPHRECTOMY Left 05/28/2013   Procedure: LEFT LAPAROSCOPIC  RADICAL NEPHRECTOMY;  Surgeon: Ardis Hughs, MD;  Location: [facility];  Service: Urology;  Laterality: Left;    Current Outpatient Medications  Medication Sig Dispense Refill   allopurinol (ZYLOPRIM) 300 MG tablet Take 300 mg by mouth daily.     calcium acetate (PHOSLO) 667 MG capsule Take 1,334 mg by mouth 3 (three) times daily with meals.     cholecalciferol (VITAMIN D) 1000 UNITS tablet Take 1,000 Units by mouth daily.     cloNIDine (CATAPRES) 0.2 MG tablet Take 0.2 mg by mouth  3 (three) times daily.     docusate sodium (COLACE) 100 MG capsule Take 1 capsule (100 mg total) by mouth every 12 (twelve) hours. 60 capsule 0   furosemide (LASIX) 40 MG tablet Take 40 mg by mouth daily.     hydrALAZINE (APRESOLINE) 25 MG tablet Take 25 mg by mouth 3 (three) times daily.     hydrALAZINE (APRESOLINE) 50 MG tablet Take 50 mg by mouth 3 (three) times daily. Patient states he takes a '25mg'$  TID as well per patient     HYDROcodone-acetaminophen (NORCO/VICODIN) 5-325 MG tablet Take 1 tablet by mouth every 4 (four) hours as needed. 15 tablet 0   magic mouthwash (lidocaine,  diphenhydrAMINE, alum & mag hydroxide) suspension Swish and spit 10 mLs 4 (four) times daily as needed for mouth pain. 360 mL 0   omeprazole (PRILOSEC) 40 MG capsule Take 40 mg by mouth every morning.      ondansetron (ZOFRAN ODT) 4 MG disintegrating tablet Take 1 tablet (4 mg total) by mouth every 6 (six) hours as needed. 20 tablet 0   predniSONE (DELTASONE) 10 MG tablet Take 10 mg by mouth daily with breakfast.     Probiotic, Lactobacillus, CAPS Take 1 capsule by mouth daily. 30 capsule 0   sodium bicarbonate 650 MG tablet Take 650 mg by mouth 2 (two) times daily.     No current facility-administered medications for this visit.    Allergies as of 10/24/2021   (No Known Allergies)    Family History  Problem Relation Age of Onset   Hypertension Mother    Cancer Father    Hypertension Sister     Social History   Socioeconomic History   Marital status: Single    Spouse name: Not on file   Number of children: Not on file   Years of education: Not on file   Highest education level: Not on file  Occupational History   Not on file  Tobacco Use   Smoking status: Former   Smokeless tobacco: Never  Substance and Sexual Activity   Alcohol use: Yes    Comment: ocassionally  QUIT SMOKING IN 2014   Drug use: No   Sexual activity: Not on file  Other Topics Concern   Not on file  Social History Narrative   Not on file   Social Determinants of Health   Financial Resource Strain: Not on file  Food Insecurity: Not on file  Transportation Needs: Not on file  Physical Activity: Not on file  Stress: Not on file  Social Connections: Not on file  Intimate Partner Violence: Not on file    Review of Systems:    Constitutional: No weight loss, fever or chills Skin: No rash  Cardiovascular: No chest pain Respiratory: No SOB  Gastrointestinal: See HPI and otherwise negative Genitourinary: No dysuria Neurological: No headache, dizziness or syncope Musculoskeletal: No new muscle or  joint pain Hematologic: No bleeding  Psychiatric: No history of depression or anxiety   Physical Exam:  Vital signs: BP 118/70 (BP Location: Left Arm, Patient Position: Sitting)   Pulse (!) 111   Ht '6\' 3"'$  (1.905 m)   Wt 183 lb 8 oz (83.2 kg)   SpO2 98%   BMI 22.94 kg/m   Constitutional:   Pleasant AA male appears to be in NAD, Well developed, Well nourished, alert and cooperative Head:  Normocephalic and atraumatic. Eyes:   PEERL, EOMI. No icterus. Conjunctiva pink. Ears:  Normal auditory acuity. Neck:  Supple Throat: Oral cavity  and pharynx without inflammation, swelling or lesion.  Respiratory: Respirations even and unlabored. Lungs clear to auscultation bilaterally.   No wheezes, crackles, or rhonchi.  Cardiovascular: Normal S1, S2. No MRG. Regular rate and rhythm. No peripheral edema, cyanosis or pallor.  Gastrointestinal:  Soft, nondistended, nontender. No rebound or guarding. Normal bowel sounds. No appreciable masses or hepatomegaly. Rectal:  Not performed.  Msk:  Symmetrical without gross deformities. Without edema, no deformity or joint abnormality.  Neurologic:  Alert and  oriented x4;  grossly normal neurologically.  Skin:   Dry and intact without significant lesions or rashes. Psychiatric:  Demonstrates good judgement and reason without abnormal affect or behaviors.  No recent labs or imaging.  Assessment: 1.  Change in bowel habits: Towards constipation over the past 5 to 6 years, but worse over the past 6 months; consider relation to nephrectomy/dehydration versus obstruction versus other 2.  Constipation: As above 3.  History of left nephrectomy  Plan: 1.  Scheduled patient for diagnostic colonoscopy in the [place] with Dr. Hilarie Fredrickson.  Did provide the patient a detailed list of risks for the procedure and he agrees to proceed. Patient is appropriate for endoscopic procedure(s) in the ambulatory ([place]) setting.  2.  Patient will have a 2-day bowel prep given his history of  constipation.  He cannot handle MiraLAX so he will have Mag citrate. 3.  Patient also given samples of Linzess 72 mcg every morning, 30 minutes before breakfast.  If this works well for him he will let us know. 4.  Patient to follow in clinic per recommendations from Dr. Hilarie Fredrickson after time of procedure.  Ellouise Newer, PA-C [facility] 10/24/2021, 10:45 AM  Cc: Sueanne Margarita, DO

## 2021-10-24 NOTE — Progress Notes (Signed)
Addendum: Reviewed and agree with assessment and management plan. Sharai Overbay M, MD  

## 2021-10-25 ENCOUNTER — Telehealth: Payer: Self-pay

## 2021-10-25 DIAGNOSIS — Z905 Acquired absence of kidney: Secondary | ICD-10-CM

## 2021-10-25 DIAGNOSIS — K5900 Constipation, unspecified: Secondary | ICD-10-CM

## 2021-10-25 DIAGNOSIS — R194 Change in bowel habit: Secondary | ICD-10-CM

## 2021-10-25 NOTE — Telephone Encounter (Signed)
Called and spoke with patient regarding Jennifer's recommendations. Pt is aware that we will call him on 11/13/21 to remind him to come in for labs prior to his colonoscopy appt. Pt verbalized understanding and had no concerns at the end of the call.   Lab orders and reminder in epic.

## 2021-10-25 NOTE — Telephone Encounter (Signed)
-----   Message from Levin Erp, [place] sent at 10/25/2021 10:25 AM EDT ----- Regarding: FW: prep Can you let patient know that I like him to come in for a CBC/CMP a week before his scheduled procedures to make sure that he is okay to proceed.  Thanks, Thomasenia Bottoms ----- Message ----- From: Jerene Bears, MD Sent: 10/24/2021   4:37 PM EDT To: Levin Erp, PA Subject: RE: prep                                       Prep probably ok but not wrong to check cbc, cmp before proceeding JMP  ----- Message ----- From: Levin Erp, PA Sent: 10/24/2021  11:48 AM EDT To: Jerene Bears, MD Subject: prep                                           I got to thinking about this patient a little more after I saw him.  He had not had any labs since November of last year and has history of left nephrectomy and CKD.  He had a change in bowel habits to constipation and wanted to have a colonoscopy which is not unreasonable.  He cannot handle MiraLAX but likely needs a 2-day bowel prep given his history.  I recommended Mag citrate and then the bowel prep you prefer.  Does he need to have labs prior to coming in for procedure?  Is there a different bowel prep you would recommend given his history?  Thanks, JL L

## 2021-11-05 MED ORDER — LINACLOTIDE 72 MCG PO CAPS: 72.0000 ug | ORAL_CAPSULE | Freq: Every day | ORAL | 1 refills | Status: DC

## 2021-11-05 NOTE — Telephone Encounter (Signed)
Rx sent 

## 2021-11-05 NOTE — Telephone Encounter (Signed)
Inbound call from patient stating prescription for Linzess has not been sent to the pharmacy. Verified pharmacy is [facility] on [address]. Please give a call back if needing to further advise.

## 2021-11-05 NOTE — Addendum Note (Signed)
Addended by: Larina Bras on: 11/05/2021 10:32 AM   Modules accepted: Orders

## 2021-11-13 ENCOUNTER — Telehealth: Payer: Self-pay

## 2021-11-13 ENCOUNTER — Encounter: Payer: Self-pay | Admitting: Internal Medicine

## 2021-11-13 ENCOUNTER — Other Ambulatory Visit (INDEPENDENT_AMBULATORY_CARE_PROVIDER_SITE_OTHER): Payer: Medicare HMO

## 2021-11-13 DIAGNOSIS — R194 Change in bowel habit: Secondary | ICD-10-CM

## 2021-11-13 DIAGNOSIS — K5900 Constipation, unspecified: Secondary | ICD-10-CM | POA: Diagnosis not present

## 2021-11-13 DIAGNOSIS — Z905 Acquired absence of kidney: Secondary | ICD-10-CM | POA: Diagnosis not present

## 2021-11-13 LAB — CBC WITH DIFFERENTIAL/PLATELET
Basophils Absolute: 0 10*3/uL (ref 0.0–0.1)
Basophils Relative: 0.6 % (ref 0.0–3.0)
Eosinophils Absolute: 0.3 10*3/uL (ref 0.0–0.7)
Eosinophils Relative: 4 % (ref 0.0–5.0)
HCT: 38.6 % — ABNORMAL LOW (ref 39.0–52.0)
Hemoglobin: 12.2 g/dL — ABNORMAL LOW (ref 13.0–17.0)
Lymphocytes Relative: 45.4 % (ref 12.0–46.0)
Lymphs Abs: 3.5 10*3/uL (ref 0.7–4.0)
MCHC: 31.7 g/dL (ref 30.0–36.0)
MCV: 91.6 fl (ref 78.0–100.0)
Monocytes Absolute: 0.6 10*3/uL (ref 0.1–1.0)
Monocytes Relative: 8.1 % (ref 3.0–12.0)
Neutro Abs: 3.3 10*3/uL (ref 1.4–7.7)
Neutrophils Relative %: 41.9 % — ABNORMAL LOW (ref 43.0–77.0)
Platelets: 171 10*3/uL (ref 150.0–400.0)
RBC: 4.21 Mil/uL — ABNORMAL LOW (ref 4.22–5.81)
RDW: 17.3 % — ABNORMAL HIGH (ref 11.5–15.5)
WBC: 7.8 10*3/uL (ref 4.0–10.5)

## 2021-11-13 LAB — COMPREHENSIVE METABOLIC PANEL
ALT: 13 U/L (ref 0–53)
AST: 16 U/L (ref 0–37)
Albumin: 3.7 g/dL (ref 3.5–5.2)
Alkaline Phosphatase: 60 U/L (ref 39–117)
BUN: 28 mg/dL — ABNORMAL HIGH (ref 6–23)
CO2: 27 mEq/L (ref 19–32)
Calcium: 9.4 mg/dL (ref 8.4–10.5)
Chloride: 105 mEq/L (ref 96–112)
Creatinine, Ser: 2.82 mg/dL — ABNORMAL HIGH (ref 0.40–1.50)
GFR: 22.43 mL/min — ABNORMAL LOW (ref >60.00)
Glucose, Bld: 115 mg/dL — ABNORMAL HIGH (ref 70–99)
Potassium: 4.1 mEq/L (ref 3.5–5.1)
Sodium: 140 mEq/L (ref 135–145)
Total Bilirubin: 0.4 mg/dL (ref 0.2–1.2)
Total Protein: 7.4 g/dL (ref 6.0–8.3)

## 2021-11-13 NOTE — Telephone Encounter (Signed)
Spoke with patient to remind him that he is due for repeat labs at this time. No appointment is necessary. Patient is aware that he can stop by the lab in the basement at his convenience between 7:30 AM - 5 PM, Monday through Friday. Pt states that he will stop by the lab today. Patient had no concerns at the end of the call.

## 2021-11-13 NOTE — Telephone Encounter (Signed)
-----   Message from Yevette Edwards, RN sent at 10/25/2021  1:50 PM EDT ----- Regarding: Labs CBC, CMET this week prior to procedure - orders are in epic

## 2021-11-19 ENCOUNTER — Encounter: Payer: Self-pay | Admitting: Internal Medicine

## 2021-11-19 ENCOUNTER — Ambulatory Visit (AMBULATORY_SURGERY_CENTER): Payer: Medicare HMO | Admitting: Internal Medicine

## 2021-11-19 ENCOUNTER — Telehealth: Payer: Self-pay

## 2021-11-19 VITALS — BP 137/75 | HR 71 | Temp 98.1°F | Resp 14 | Ht 75.0 in | Wt 183.0 lb

## 2021-11-19 DIAGNOSIS — K5730 Diverticulosis of large intestine without perforation or abscess without bleeding: Secondary | ICD-10-CM | POA: Diagnosis not present

## 2021-11-19 DIAGNOSIS — R194 Change in bowel habit: Secondary | ICD-10-CM

## 2021-11-19 DIAGNOSIS — D122 Benign neoplasm of ascending colon: Secondary | ICD-10-CM | POA: Diagnosis not present

## 2021-11-19 DIAGNOSIS — D123 Benign neoplasm of transverse colon: Secondary | ICD-10-CM

## 2021-11-19 MED ORDER — LUBIPROSTONE 24 MCG PO CAPS: 24.0000 ug | ORAL_CAPSULE | Freq: Two times a day (BID) | ORAL | 3 refills | Status: DC

## 2021-11-19 MED ORDER — SODIUM CHLORIDE 0.9 % IV SOLN: 500.0000 mL | Freq: Once | INTRAVENOUS | Status: DC

## 2021-11-19 NOTE — Progress Notes (Unsigned)
Please see office note dated 10/24/2021 for details and current H&P   Patient presenting for colonoscopy to evaluate change in bowel habits Last colonoscopy was performed in 2016 during a diverticular hemorrhage  He remains appropriate for colonoscopy in the [facility] today

## 2021-11-19 NOTE — Patient Instructions (Signed)
Resume previous diet and medications. Awaiting pathology results. Repeat Colonoscopy date to be determined based on pathology results. Trial of Amitizia 24 mcg BID. Please contact my office if medication not effective.  YOU HAD AN ENDOSCOPIC PROCEDURE TODAY AT [facility]:   Refer to the procedure report that was given to you for any specific questions about what was found during the examination.  If the procedure report does not answer your questions, please call your gastroenterologist to clarify.  If you requested that your care partner not be given the details of your procedure findings, then the procedure report has been included in a sealed envelope for you to review at your convenience later.  YOU SHOULD EXPECT: Some feelings of bloating in the abdomen. Passage of more gas than usual.  Walking can help get rid of the air that was put into your GI tract during the procedure and reduce the bloating. If you had a lower endoscopy (such as a colonoscopy or flexible sigmoidoscopy) you may notice spotting of blood in your stool or on the toilet paper. If you underwent a bowel prep for your procedure, you may not have a normal bowel movement for a few days.  Please Note:  You might notice some irritation and congestion in your nose or some drainage.  This is from the oxygen used during your procedure.  There is no need for concern and it should clear up in a day or so.  SYMPTOMS TO REPORT IMMEDIATELY:  Following lower endoscopy (colonoscopy or flexible sigmoidoscopy):  Excessive amounts of blood in the stool  Significant tenderness or worsening of abdominal pains  Swelling of the abdomen that is new, acute  Fever of 100F or higher  For urgent or emergent issues, a gastroenterologist can be reached at any hour by calling [phone]. Do not use MyChart messaging for urgent concerns.    DIET:  We do recommend a small meal at first, but then you may proceed to your regular diet.   Drink plenty of fluids but you should avoid alcoholic beverages for 24 hours.  ACTIVITY:  You should plan to take it easy for the rest of today and you should NOT DRIVE or use heavy machinery until tomorrow (because of the sedation medicines used during the test).    FOLLOW UP: Our staff will call the number listed on your records the next business day following your procedure.  We will call around 7:15- 8:00 am to check on you and address any questions or concerns that you may have regarding the information given to you following your procedure. If we do not reach you, we will leave a message.     If any biopsies were taken you will be contacted by phone or by letter within the next 1-3 weeks.  Please call us at [phone] if you have not heard about the biopsies in 3 weeks.    SIGNATURES/CONFIDENTIALITY: You and/or your care partner have signed paperwork which will be entered into your electronic medical record.  These signatures attest to the fact that that the information above on your After Visit Summary has been reviewed and is understood.  Full responsibility of the confidentiality of this discharge information lies with you and/or your care-partner.

## 2021-11-19 NOTE — Telephone Encounter (Signed)
Called patient to see if he could come in for today's procedure at 1:30 instead of 2:30.

## 2021-11-19 NOTE — Progress Notes (Unsigned)
Called to room to assist during endoscopic procedure.  Patient ID and intended procedure confirmed with present staff. Received instructions for my participation in the procedure from the performing physician.  

## 2021-11-19 NOTE — Op Note (Signed)
[facility] Patient Name: Brian Lawson Procedure Date: 11/19/2021 3:01 PM MRN: [id number] Endoscopist: Jerene Bears , MD Age: 66 Referring MD:  Date of Birth: Nov 25, 1955 Gender: Male Account #: [id number] Procedure:                Colonoscopy Indications:              Change in bowel habits with constipation Medicines:                Monitored Anesthesia Care Procedure:                Pre-Anesthesia Assessment:                           - Prior to the procedure, a History and Physical                            was performed, and patient medications and                            allergies were reviewed. The patient's tolerance of                            previous anesthesia was also reviewed. The risks                            and benefits of the procedure and the sedation                            options and risks were discussed with the patient.                            All questions were answered, and informed consent                            was obtained. Prior Anticoagulants: The patient has                            taken no previous anticoagulant or antiplatelet                            agents. ASA Grade Assessment: III - A patient with                            severe systemic disease. After reviewing the risks                            and benefits, the patient was deemed in                            satisfactory condition to undergo the procedure.                           After obtaining informed consent, the colonoscope  was passed under direct vision. Throughout the                            procedure, the patient's blood pressure, pulse, and                            oxygen saturations were monitored continuously. The                            CF [id number] #[id number] was introduced through the anus                            and advanced to the cecum, identified by                            appendiceal orifice and  ileocecal valve. The                            colonoscopy was performed without difficulty. The                            patient tolerated the procedure well. The quality                            of the bowel preparation was good. The ileocecal                            valve, appendiceal orifice, and rectum were                            photographed. Scope In: 3:14:24 PM Scope Out: 3:41:26 PM Scope Withdrawal Time: 0 hours 16 minutes 27 seconds  Total Procedure Duration: 0 hours 27 minutes 2 seconds  Findings:                 The digital rectal exam was normal.                           A 6 mm polyp was found in the ascending colon. The                            polyp was sessile. The polyp was removed with a                            cold snare. Resection and retrieval were complete.                           A 8 mm polyp was found in the proximal transverse                            colon. The polyp was sessile. The polyp was removed                            with a cold  snare. Resection and retrieval were                            complete.                           Multiple small and large-mouthed diverticula were                            found in the sigmoid colon, distal descending colon                            and ascending colon.                           The retroflexed view of the distal rectum and anal                            verge was normal and showed no anal or rectal                            abnormalities. Complications:            No immediate complications. Estimated Blood Loss:     Estimated blood loss was minimal. Impression:               - One 6 mm polyp in the ascending colon, removed                            with a cold snare. Resected and retrieved.                           - One 8 mm polyp in the proximal transverse colon,                            removed with a cold snare. Resected and retrieved.                           -  Moderate diverticulosis in the sigmoid colon, in                            the distal descending colon and in the ascending                            colon. Recommendation:           - Patient has a contact number available for                            emergencies. The signs and symptoms of potential                            delayed complications were discussed with the                            patient. Return to  normal activities tomorrow.                            Written discharge instructions were provided to the                            patient.                           - Resume previous diet.                           - Continue present medications. MiraLax, colace                            ineffective. Linzess "covered" but cost                            prohibitive. Trial of Amitiza 24 mcg BID. Please                            contact my office if medication not effective.                           - Await pathology results.                           - Repeat colonoscopy is recommended. The                            colonoscopy date will be determined after pathology                            results from today's exam become available for                            review. Jerene Bears, MD 11/19/2021 3:47:38 PM This report has been signed electronically.

## 2021-11-19 NOTE — Progress Notes (Signed)
To pacu, VSS. Report to [email or web address] 

## 2021-11-20 ENCOUNTER — Other Ambulatory Visit (HOSPITAL_COMMUNITY): Payer: Self-pay

## 2021-11-21 MED ORDER — LUBIPROSTONE 24 MCG PO CAPS: 24.0000 ug | ORAL_CAPSULE | Freq: Two times a day (BID) | ORAL | 3 refills | Status: DC

## 2021-11-21 NOTE — Addendum Note (Signed)
Addended by: Jess Barters L on: 11/21/2021 04:54 PM   Modules accepted: Orders

## 2021-11-21 NOTE — Progress Notes (Signed)
Pt called stating pharmacy did not receive prescription for Amitiza. Reordered prescription. Called to verify that they did receive it and they did. Pt's insurance does not cover this medication, needs prior authorization or a different medication. Pharmacy tech stated to RN that she would fax the prior auth request to MD. Hulen Skains pt to inform him of status. Asked him to follow up with pharmacy or MD's office in a few days. Pt verbalized understanding. Will notify MD.

## 2021-11-22 ENCOUNTER — Other Ambulatory Visit (HOSPITAL_COMMUNITY): Payer: Self-pay

## 2021-11-23 ENCOUNTER — Encounter: Payer: Self-pay | Admitting: Internal Medicine

## 2021-11-26 ENCOUNTER — Other Ambulatory Visit (HOSPITAL_COMMUNITY): Payer: Self-pay

## 2021-11-26 ENCOUNTER — Telehealth: Payer: Self-pay

## 2021-11-26 NOTE — Telephone Encounter (Signed)
Patient Advocate Encounter   Received notification from [facility] that prior authorization is required for Lubiprostone 24MCG capsules  Submitted: 11-26-2021 Key [id number]

## 2021-11-27 ENCOUNTER — Other Ambulatory Visit (HOSPITAL_COMMUNITY): Payer: Self-pay

## 2021-11-27 NOTE — Telephone Encounter (Signed)
Called patient. He still has the sample of Linzess 72 mcgs he was given at last office visit and has Not tried them.  I explained that he needs to try and fail those before his insurance would consider paying for Amitiza.  He indicated that he would try the samples and let us know if they are effective at treating his constipation.  Per PA team he would pay $45 for a 30 day supply of Linzess. Patient expressed understanding.

## 2021-11-27 NOTE — Addendum Note (Signed)
Addended by: Roetta Sessions on: 11/27/2021 02:29 PM   Modules accepted: Orders

## 2021-11-27 NOTE — Telephone Encounter (Signed)
According to PA Denial letter, estimated price if approved is $95.00. I see the Linzess is $45.00 through a test claim. If patient has not try/failed Linzess then I don't think it will be approved as BOTH need to be try/failed. Lactulose not being appropriate is understandable, but he would still have to try/fail the Linzess. And since patient is covered under Medicare, he is not eligible for patient assistance. If samples are available, please supply if cost is issue.

## 2021-11-27 NOTE — Telephone Encounter (Signed)
Patient Advocate Encounter  Received a fax from [facility] regarding Prior Authorization for Lubiprostone 24MCG capsules.   Authorization has been DENIED due to must have try/fail of lactulose oral solution AND linzess capsule. Need documentation why these two drugs would not work for you and/or would have bad side effects  Determination letter attached to chart

## 2021-12-03 NOTE — Telephone Encounter (Signed)
You're very welcome!

## 2021-12-10 NOTE — Telephone Encounter (Signed)
Patient called asking for a returned call back regarding the Linzess

## 2021-12-10 NOTE — Telephone Encounter (Signed)
Called patient. He indicated he tried the Linzess 72 mcg and it was not effective.  Only caused gas.  Please initiate PA for Amitiza 24 mcg BID per Dr. Hilarie Fredrickson. Thank you

## 2021-12-12 ENCOUNTER — Other Ambulatory Visit (HOSPITAL_COMMUNITY): Payer: Self-pay

## 2021-12-12 NOTE — Telephone Encounter (Signed)
I'm going to route this to the appeal team since I'm unable to resubmit request within 60 days of denial.

## 2021-12-17 MED ORDER — LUBIPROSTONE 24 MCG PO CAPS: 24.0000 ug | ORAL_CAPSULE | Freq: Two times a day (BID) | ORAL | 3 refills | Status: DC

## 2021-12-17 NOTE — Addendum Note (Signed)
Addended by: Roetta Sessions on: 12/17/2021 01:42 PM   Modules accepted: Orders

## 2021-12-17 NOTE — Telephone Encounter (Signed)
Received fax regarding appeal for Lubiprostone 24MCG capsules.   Appeal has been APPROVED.  Effective dates: 03-11-2021 to 03-11-2023

## 2021-12-17 NOTE — Telephone Encounter (Signed)
Order for Amitiza 24 mcg BID sent  to pharmacy

## 2022-01-29 DIAGNOSIS — R7989 Other specified abnormal findings of blood chemistry: Secondary | ICD-10-CM | POA: Diagnosis not present

## 2022-01-29 DIAGNOSIS — R7301 Impaired fasting glucose: Secondary | ICD-10-CM | POA: Diagnosis not present

## 2022-01-29 DIAGNOSIS — I10 Essential (primary) hypertension: Secondary | ICD-10-CM | POA: Diagnosis not present

## 2022-01-29 DIAGNOSIS — Z125 Encounter for screening for malignant neoplasm of prostate: Secondary | ICD-10-CM | POA: Diagnosis not present

## 2022-01-29 DIAGNOSIS — E785 Hyperlipidemia, unspecified: Secondary | ICD-10-CM | POA: Diagnosis not present

## 2022-02-05 DIAGNOSIS — N184 Chronic kidney disease, stage 4 (severe): Secondary | ICD-10-CM | POA: Diagnosis not present

## 2022-02-05 DIAGNOSIS — M609 Myositis, unspecified: Secondary | ICD-10-CM | POA: Diagnosis not present

## 2022-02-05 DIAGNOSIS — E785 Hyperlipidemia, unspecified: Secondary | ICD-10-CM | POA: Diagnosis not present

## 2022-02-05 DIAGNOSIS — Z1389 Encounter for screening for other disorder: Secondary | ICD-10-CM | POA: Diagnosis not present

## 2022-02-05 DIAGNOSIS — D649 Anemia, unspecified: Secondary | ICD-10-CM | POA: Diagnosis not present

## 2022-02-05 DIAGNOSIS — N2581 Secondary hyperparathyroidism of renal origin: Secondary | ICD-10-CM | POA: Diagnosis not present

## 2022-02-05 DIAGNOSIS — I10 Essential (primary) hypertension: Secondary | ICD-10-CM | POA: Diagnosis not present

## 2022-02-05 DIAGNOSIS — Z1331 Encounter for screening for depression: Secondary | ICD-10-CM | POA: Diagnosis not present

## 2022-02-05 DIAGNOSIS — Z85528 Personal history of other malignant neoplasm of kidney: Secondary | ICD-10-CM | POA: Diagnosis not present

## 2022-02-05 DIAGNOSIS — M109 Gout, unspecified: Secondary | ICD-10-CM | POA: Diagnosis not present

## 2022-02-05 DIAGNOSIS — Z0000 Encounter for general adult medical examination without abnormal findings: Secondary | ICD-10-CM | POA: Diagnosis not present

## 2022-02-05 DIAGNOSIS — R82998 Other abnormal findings in urine: Secondary | ICD-10-CM | POA: Diagnosis not present

## 2022-02-07 DIAGNOSIS — D631 Anemia in chronic kidney disease: Secondary | ICD-10-CM | POA: Diagnosis not present

## 2022-02-07 DIAGNOSIS — N189 Chronic kidney disease, unspecified: Secondary | ICD-10-CM | POA: Diagnosis not present

## 2022-02-07 DIAGNOSIS — N184 Chronic kidney disease, stage 4 (severe): Secondary | ICD-10-CM | POA: Diagnosis not present

## 2022-02-07 DIAGNOSIS — N2581 Secondary hyperparathyroidism of renal origin: Secondary | ICD-10-CM | POA: Diagnosis not present

## 2022-02-07 DIAGNOSIS — Z905 Acquired absence of kidney: Secondary | ICD-10-CM | POA: Diagnosis not present

## 2022-02-07 DIAGNOSIS — E872 Acidosis, unspecified: Secondary | ICD-10-CM | POA: Diagnosis not present

## 2022-02-07 DIAGNOSIS — M109 Gout, unspecified: Secondary | ICD-10-CM | POA: Diagnosis not present

## 2022-02-07 DIAGNOSIS — I129 Hypertensive chronic kidney disease with stage 1 through stage 4 chronic kidney disease, or unspecified chronic kidney disease: Secondary | ICD-10-CM | POA: Diagnosis not present

## 2022-02-07 DIAGNOSIS — I770 Arteriovenous fistula, acquired: Secondary | ICD-10-CM | POA: Diagnosis not present

## 2022-02-22 DIAGNOSIS — H524 Presbyopia: Secondary | ICD-10-CM | POA: Diagnosis not present

## 2022-02-22 DIAGNOSIS — H5213 Myopia, bilateral: Secondary | ICD-10-CM | POA: Diagnosis not present

## 2022-02-22 DIAGNOSIS — H52209 Unspecified astigmatism, unspecified eye: Secondary | ICD-10-CM | POA: Diagnosis not present

## 2022-05-29 NOTE — Telephone Encounter (Signed)
error 

## 2022-08-08 DIAGNOSIS — Z905 Acquired absence of kidney: Secondary | ICD-10-CM | POA: Diagnosis not present

## 2022-08-08 DIAGNOSIS — N2581 Secondary hyperparathyroidism of renal origin: Secondary | ICD-10-CM | POA: Diagnosis not present

## 2022-08-08 DIAGNOSIS — I770 Arteriovenous fistula, acquired: Secondary | ICD-10-CM | POA: Diagnosis not present

## 2022-08-08 DIAGNOSIS — N189 Chronic kidney disease, unspecified: Secondary | ICD-10-CM | POA: Diagnosis not present

## 2022-08-08 DIAGNOSIS — N184 Chronic kidney disease, stage 4 (severe): Secondary | ICD-10-CM | POA: Diagnosis not present

## 2022-08-08 DIAGNOSIS — E872 Acidosis, unspecified: Secondary | ICD-10-CM | POA: Diagnosis not present

## 2022-08-08 DIAGNOSIS — K5900 Constipation, unspecified: Secondary | ICD-10-CM | POA: Diagnosis not present

## 2022-08-08 DIAGNOSIS — I129 Hypertensive chronic kidney disease with stage 1 through stage 4 chronic kidney disease, or unspecified chronic kidney disease: Secondary | ICD-10-CM | POA: Diagnosis not present

## 2022-08-08 DIAGNOSIS — M109 Gout, unspecified: Secondary | ICD-10-CM | POA: Diagnosis not present

## 2022-08-08 DIAGNOSIS — D631 Anemia in chronic kidney disease: Secondary | ICD-10-CM | POA: Diagnosis not present

## 2022-08-09 DIAGNOSIS — I129 Hypertensive chronic kidney disease with stage 1 through stage 4 chronic kidney disease, or unspecified chronic kidney disease: Secondary | ICD-10-CM | POA: Diagnosis not present

## 2022-08-09 DIAGNOSIS — E785 Hyperlipidemia, unspecified: Secondary | ICD-10-CM | POA: Diagnosis not present

## 2022-08-09 DIAGNOSIS — N184 Chronic kidney disease, stage 4 (severe): Secondary | ICD-10-CM | POA: Diagnosis not present

## 2022-08-09 DIAGNOSIS — B181 Chronic viral hepatitis B without delta-agent: Secondary | ICD-10-CM | POA: Diagnosis not present

## 2022-08-09 DIAGNOSIS — R7303 Prediabetes: Secondary | ICD-10-CM | POA: Diagnosis not present

## 2022-08-09 DIAGNOSIS — K5900 Constipation, unspecified: Secondary | ICD-10-CM | POA: Diagnosis not present

## 2022-08-09 DIAGNOSIS — N2581 Secondary hyperparathyroidism of renal origin: Secondary | ICD-10-CM | POA: Diagnosis not present

## 2022-08-09 DIAGNOSIS — D649 Anemia, unspecified: Secondary | ICD-10-CM | POA: Diagnosis not present

## 2022-08-09 DIAGNOSIS — M609 Myositis, unspecified: Secondary | ICD-10-CM | POA: Diagnosis not present

## 2023-02-03 DIAGNOSIS — E785 Hyperlipidemia, unspecified: Secondary | ICD-10-CM | POA: Diagnosis not present

## 2023-02-03 DIAGNOSIS — Z79899 Other long term (current) drug therapy: Secondary | ICD-10-CM | POA: Diagnosis not present

## 2023-02-03 DIAGNOSIS — D649 Anemia, unspecified: Secondary | ICD-10-CM | POA: Diagnosis not present

## 2023-02-03 DIAGNOSIS — I129 Hypertensive chronic kidney disease with stage 1 through stage 4 chronic kidney disease, or unspecified chronic kidney disease: Secondary | ICD-10-CM | POA: Diagnosis not present

## 2023-02-03 DIAGNOSIS — N184 Chronic kidney disease, stage 4 (severe): Secondary | ICD-10-CM | POA: Diagnosis not present

## 2023-02-03 DIAGNOSIS — M109 Gout, unspecified: Secondary | ICD-10-CM | POA: Diagnosis not present

## 2023-02-03 DIAGNOSIS — Z125 Encounter for screening for malignant neoplasm of prostate: Secondary | ICD-10-CM | POA: Diagnosis not present

## 2023-02-10 DIAGNOSIS — N2581 Secondary hyperparathyroidism of renal origin: Secondary | ICD-10-CM | POA: Diagnosis not present

## 2023-02-10 DIAGNOSIS — I129 Hypertensive chronic kidney disease with stage 1 through stage 4 chronic kidney disease, or unspecified chronic kidney disease: Secondary | ICD-10-CM | POA: Diagnosis not present

## 2023-02-10 DIAGNOSIS — N189 Chronic kidney disease, unspecified: Secondary | ICD-10-CM | POA: Diagnosis not present

## 2023-02-10 DIAGNOSIS — D631 Anemia in chronic kidney disease: Secondary | ICD-10-CM | POA: Diagnosis not present

## 2023-02-10 DIAGNOSIS — M109 Gout, unspecified: Secondary | ICD-10-CM | POA: Diagnosis not present

## 2023-02-10 DIAGNOSIS — I770 Arteriovenous fistula, acquired: Secondary | ICD-10-CM | POA: Diagnosis not present

## 2023-02-10 DIAGNOSIS — Z905 Acquired absence of kidney: Secondary | ICD-10-CM | POA: Diagnosis not present

## 2023-02-10 DIAGNOSIS — N184 Chronic kidney disease, stage 4 (severe): Secondary | ICD-10-CM | POA: Diagnosis not present

## 2023-02-11 DIAGNOSIS — N2581 Secondary hyperparathyroidism of renal origin: Secondary | ICD-10-CM | POA: Diagnosis not present

## 2023-02-11 DIAGNOSIS — I10 Essential (primary) hypertension: Secondary | ICD-10-CM | POA: Diagnosis not present

## 2023-02-11 DIAGNOSIS — M109 Gout, unspecified: Secondary | ICD-10-CM | POA: Diagnosis not present

## 2023-02-11 DIAGNOSIS — I770 Arteriovenous fistula, acquired: Secondary | ICD-10-CM | POA: Diagnosis not present

## 2023-02-11 DIAGNOSIS — Z1339 Encounter for screening examination for other mental health and behavioral disorders: Secondary | ICD-10-CM | POA: Diagnosis not present

## 2023-02-11 DIAGNOSIS — Z85528 Personal history of other malignant neoplasm of kidney: Secondary | ICD-10-CM | POA: Diagnosis not present

## 2023-02-11 DIAGNOSIS — Z7952 Long term (current) use of systemic steroids: Secondary | ICD-10-CM | POA: Diagnosis not present

## 2023-02-11 DIAGNOSIS — M609 Myositis, unspecified: Secondary | ICD-10-CM | POA: Diagnosis not present

## 2023-02-11 DIAGNOSIS — I129 Hypertensive chronic kidney disease with stage 1 through stage 4 chronic kidney disease, or unspecified chronic kidney disease: Secondary | ICD-10-CM | POA: Diagnosis not present

## 2023-02-11 DIAGNOSIS — Z905 Acquired absence of kidney: Secondary | ICD-10-CM | POA: Diagnosis not present

## 2023-02-11 DIAGNOSIS — Z1331 Encounter for screening for depression: Secondary | ICD-10-CM | POA: Diagnosis not present

## 2023-02-11 DIAGNOSIS — E785 Hyperlipidemia, unspecified: Secondary | ICD-10-CM | POA: Diagnosis not present

## 2023-02-11 DIAGNOSIS — Z0000 Encounter for general adult medical examination without abnormal findings: Secondary | ICD-10-CM | POA: Diagnosis not present

## 2023-02-11 DIAGNOSIS — N184 Chronic kidney disease, stage 4 (severe): Secondary | ICD-10-CM | POA: Diagnosis not present

## 2023-02-11 DIAGNOSIS — R0789 Other chest pain: Secondary | ICD-10-CM | POA: Diagnosis not present

## 2023-02-18 NOTE — Progress Notes (Unsigned)
Cardiology Office Note:   Date:  02/19/2023  ID:  Brian Lawson, DOB 1955/04/30, MRN [id number] PCP:  Brian Ferretti, DO  [facility] Providers Cardiologist:  Brian Skeans, MD Referring MD: Brian Ferretti, DO  Chief Complaint/Reason for Referral: Chest pain ASSESSMENT:    1. Precordial pain   2. Aortic atherosclerosis (HCC)   3. Hyperlipidemia LDL goal <70   4. Primary hypertension   5. CKD (chronic kidney disease) stage 4, GFR 15-29 ml/min (HCC)     PLAN:   In order of problems listed above: Chest pain: PET stress test and echocardiogram.  Only if high risk findings will be proceed to diagnostic coronary angiography and potentially staged PCI.  Will maximize medical therapy otherwise. Aortic atherosclerosis: Patient was intolerant of atorvastatin.  Continue Crestor and start aspirin 81 mg daily. Hyperlipidemia: Continue Crestor which was increased to 10 mg by his PCP.  Will plan on checking lipid panel LFTs and LP(a) in the future. Hypertension: Blood pressure reviewed is low today.  Will decrease hydralazine to 50 mg 3 times daily. CKD stage IV: Will have to be careful about the use of dye in the future.        Informed Consent   Shared Decision Making/Informed Consent The risks [chest pain, shortness of breath, cardiac arrhythmias, dizziness, blood pressure fluctuations, myocardial infarction, stroke/transient ischemic attack, nausea, vomiting, allergic reaction, radiation exposure, metallic taste sensation and life-threatening complications (estimated to be 1 in 10,000)], benefits (risk stratification, diagnosing coronary artery disease, treatment guidance) and alternatives of a cardiac PET stress test were discussed in detail with Brian Lawson and he agrees to proceed.      Dispo:  Return in about 6 months (around 08/20/2023).      Medication Adjustments/Labs and Tests Ordered: Current medicines are reviewed at length with the patient today.  Concerns regarding medicines  are outlined above.  The following changes have been made:     Labs/tests ordered: Orders Placed This Encounter  Procedures   NM PET CT CARDIAC PERFUSION MULTI W/ABSOLUTE BLOODFLOW   EKG 12-Lead   ECHOCARDIOGRAM COMPLETE    Medication Changes: Meds ordered this encounter  Medications   aspirin EC 81 MG tablet    Sig: Take 1 tablet (81 mg total) by mouth daily. Swallow whole.    Current medicines are reviewed at length with the patient today.  The patient does not have concerns regarding medicines.     History of Present Illness:      FOCUSED PROBLEM LIST:   Hypertension Hyperlipidemia Intolerant of atorvastatin Aortic atherosclerosis CT abdomen pelvis 2022 CKD stage IV Renal cell carcinoma status post nephrectomy 2015 BMI 27  12/24: The patient is a 67 year old male with the above listed medical problems referred to their PCP for recommendations regarding atypical chest pain.  The patient was seen recently and reported chest pain that occurred at rest and occasionally with exertion.  It was not reproducible with exertion.  Due to a lack of energy and the symptoms he is referred to cardiology for further recommendations.  The patient tells me that over the last 9 months he has developed more frequent episodes of chest discomfort.  This typically happens at rest and is sometimes modifiable by rubbing on his chest.  He occasionally gets short of breath and chest tightness when he walks but this does not happen on a regular basis.  He denies any peripheral edema or paroxysmal nocturnal dyspnea.  He does have a right upper extremity fistula in place that is  not enlarged and has not yet been used.  He is pretty compliant with his medical therapy.  He tells me that his blood pressure was fairly stable last week though he had a question about whether his hydralazine dose needed to be decreased.  He fortunately has not required any emergency room visits or hospitalizations.           Current Medications: Current Meds  Medication Sig   allopurinol (ZYLOPRIM) 100 MG tablet Take 150 mg by mouth daily.   aspirin EC 81 MG tablet Take 1 tablet (81 mg total) by mouth daily. Swallow whole.   cholecalciferol (VITAMIN D) 1000 UNITS tablet Take 1,000 Units by mouth daily.   cloNIDine (CATAPRES) 0.2 MG tablet Take 0.2 mg by mouth 3 (three) times daily.   colchicine 0.6 MG tablet Take 0.3 mg by mouth daily.   hydrALAZINE (APRESOLINE) 50 MG tablet Take 50 mg by mouth 3 (three) times daily. Patient states he takes a 25mg  TID as well per patient   lactulose (CHRONULAC) 10 GM/15ML solution SMARTSIG:Milliliter(s) By Mouth   Probiotic, Lactobacillus, CAPS Take 1 capsule by mouth daily.   rosuvastatin (CRESTOR) 10 MG tablet Take 10 mg by mouth daily.   [DISCONTINUED] hydrALAZINE (APRESOLINE) 25 MG tablet Take 25 mg by mouth 3 (three) times daily.     Review of Systems:   Please see the history of present illness.    All other systems reviewed and are negative.     EKGs/Labs/Other Test Reviewed:   EKG: EKG performed 2021 demonstrates sinus tachycardia with nonspecific ST and T wave changes.  EKG Interpretation Date/Time:  Wednesday February 19 2023 11:47:25 EST Ventricular Rate:  108 PR Interval:  158 QRS Duration:  92 QT Interval:  334 QTC Calculation: 447 R Axis:   33  Text Interpretation: Sinus tachycardia Left ventricular hypertrophy with repolarization abnormality ( R in aVL , Romhilt-Estes ) When compared with ECG of 03-Oct-2019 14:07, T wave inversion now evident in Inferior leads Confirmed by Brian Lawson ([id number]) on 02/19/2023 11:55:48 AM         Risk Assessment/Calculations:          Physical Exam:   VS:  BP (!) 96/42   Pulse 99   Ht 6\' 3"  (1.905 m)   Wt 196 lb (88.9 kg)   SpO2 96%   BMI 24.50 kg/m        Wt Readings from Last 3 Encounters:  02/19/23 196 lb (88.9 kg)  11/19/21 183 lb (83 kg)  10/24/21 183 lb 8 oz (83.2 kg)      GENERAL:  No  apparent distress, AOx3 HEENT:  No carotid bruits, +2 carotid impulses, no scleral icterus CAR: RRR  no murmurs, gallops, rubs, or thrills RES:  Clear to auscultation bilaterally ABD:  Soft, nontender, nondistended, positive bowel sounds x 4 VASC:  +2 radial pulses, +2 carotid pulses NEURO:  CN 2-12 grossly intact; motor and sensory grossly intact PSYCH:  No active depression or anxiety EXT:  No edema, ecchymosis, or cyanosis; right upper extremity fistula  Signed, Orbie Pyo, MD  02/19/2023 12:23 PM    [facility] [address] [place], [place], [place]  [address] Phone: [phone]; Fax: [phone]   Note:  This document was prepared using Dragon voice recognition software and may include unintentional dictation errors.

## 2023-02-19 ENCOUNTER — Ambulatory Visit: Payer: Medicare HMO | Attending: Internal Medicine | Admitting: Internal Medicine

## 2023-02-19 ENCOUNTER — Encounter: Payer: Self-pay | Admitting: Internal Medicine

## 2023-02-19 VITALS — BP 96/42 | HR 99 | Ht 75.0 in | Wt 196.0 lb

## 2023-02-19 DIAGNOSIS — I700 Atherosclerosis of aorta: Secondary | ICD-10-CM

## 2023-02-19 DIAGNOSIS — R072 Precordial pain: Secondary | ICD-10-CM | POA: Diagnosis not present

## 2023-02-19 DIAGNOSIS — E785 Hyperlipidemia, unspecified: Secondary | ICD-10-CM

## 2023-02-19 DIAGNOSIS — I10 Essential (primary) hypertension: Secondary | ICD-10-CM

## 2023-02-19 DIAGNOSIS — N184 Chronic kidney disease, stage 4 (severe): Secondary | ICD-10-CM

## 2023-02-19 MED ORDER — ASPIRIN 81 MG PO TBEC: 81.0000 mg | DELAYED_RELEASE_TABLET | Freq: Every day | ORAL | Status: DC

## 2023-02-19 NOTE — Patient Instructions (Addendum)
Medication Instructions:  Your physician has recommended you make the following change in your medication:   1) START aspirin 81 mg daily -- you can purchase this over the counter 2) STOP hydralazine 25 mg, continue only hydralazine 50 mg three times daily  *If you need a refill on your cardiac medications before your next appointment, please call your pharmacy*  Lab Work: None ordered today.  Testing/Procedures: Your physician has requested that you have an echocardiogram. Echocardiography is a painless test that uses sound waves to create images of your heart. It provides your doctor with information about the size and shape of your heart and how well your heart's chambers and valves are working. This procedure takes approximately one hour. There are no restrictions for this procedure. Please do NOT wear cologne, perfume, aftershave, or lotions (deodorant is allowed). Please arrive 15 minutes prior to your appointment time.  Please note: We ask at that you not bring children with you during ultrasound (echo/ vascular) testing. Due to room size and safety concerns, children are not allowed in the ultrasound rooms during exams. Our front office staff cannot provide observation of children in our lobby area while testing is being conducted. An adult accompanying a patient to their appointment will only be allowed in the ultrasound room at the discretion of the ultrasound technician under special circumstances. We apologize for any inconvenience.  Your physician has requested that you have a Cardiac PET CT stress test. This will be performed at [facility], a staff member will contact you to schedule this test. Please see instructions below:     Please report to Radiology at the [facility] Main Entrance 30 minutes early for your test.  [address] [place], [place] [address]  How to Prepare for Your Cardiac PET/CT Stress Test:  Nothing to eat or drink, except water,  3 hours prior to arrival time.  NO caffeine/decaffeinated products, or chocolate 12 hours prior to arrival. (Please note decaffeinated beverages (teas/coffees) still contain caffeine).  If you have caffeine within 12 hours prior, the test will need to be rescheduled.  Medication instructions: Do not take erectile dysfunction medications for 72 hours prior to test (sildenafil, tadalafil) Do not take nitrates (isosorbide mononitrate, Ranexa) the day before or day of test Do not take tamsulosin the day before or morning of test Hold theophylline containing medications for 12 hours. Hold Dipyridamole 48 hours prior to the test.  Diabetic Preparation: If able to eat breakfast prior to 3 hour fasting, you may take all medications, including your insulin. Do not worry if you miss your breakfast dose of insulin - start at your next meal. If you do not eat prior to 3 hour fast-Hold all diabetes (oral and insulin) medications. Patients who wear a continuous glucose monitor MUST remove the device prior to scanning.  You may take your remaining medications with water.  NO perfume, cologne or lotion on chest or abdomen area. FEMALES - Please avoid wearing dresses to this appointment.  Total time is 1 to 2 hours; you may want to bring reading material for the waiting time.  IF YOU THINK YOU MAY BE PREGNANT, OR ARE NURSING PLEASE INFORM THE TECHNOLOGIST.  In preparation for your appointment, medication and supplies will be purchased.  Appointment availability is limited, so if you need to cancel or reschedule, please call the Radiology Department at [phone] [facility]) OR [phone] [facility] 24 hours in advance to avoid a cancellation fee of $100.00  What  to Expect When you Arrive:  Once you arrive and check in for your appointment, you will be taken to a preparation room within the Radiology Department.  A technologist or Nurse will obtain your medical history, verify that you are correctly  prepped for the exam, and explain the procedure.  Afterwards, an IV will be started in your arm and electrodes will be placed on your skin for EKG monitoring during the stress portion of the exam. Then you will be escorted to the PET/CT scanner.  There, staff will get you positioned on the scanner and obtain a blood pressure and EKG.  During the exam, you will continue to be connected to the EKG and blood pressure machines.  A small, safe amount of a radioactive tracer will be injected in your IV to obtain a series of pictures of your heart along with an injection of a stress agent.    After your Exam:  It is recommended that you eat a meal and drink a caffeinated beverage to counter act any effects of the stress agent.  Drink plenty of fluids for the remainder of the day and urinate frequently for the first couple of hours after the exam.  Your doctor will inform you of your test results within 7-10 business days.  For more information and frequently asked questions, please visit our website: [email or web address]  For questions about your test or how to prepare for your test, please call: Cardiac Imaging Nurse Navigators Office: [phone]   Follow-Up: At [facility], you and your health needs are our priority.  As part of our continuing mission to provide you with exceptional heart care, we have created designated Provider Care Teams.  These Care Teams include your primary Cardiologist (physician) and Advanced Practice Providers (APPs -  Physician Assistants and Nurse Practitioners) who all work together to provide you with the care you need, when you need it.  We recommend signing up for the patient portal called "MyChart".  Sign up information is provided on this After Visit Summary.  MyChart is used to connect with patients for Virtual Visits (Telemedicine).  Patients are able to view lab/test results, encounter notes, upcoming appointments, etc.  Non-urgent messages can be  sent to your provider as well.   To learn more about what you can do with MyChart, go to [email or web address].    Your next appointment:   6 month(s)  The format for your next appointment:   In Person  Provider:   Orbie Pyo, MD

## 2023-03-27 ENCOUNTER — Ambulatory Visit (HOSPITAL_COMMUNITY): Payer: Medicare (Managed Care) | Attending: Internal Medicine

## 2023-03-27 DIAGNOSIS — R072 Precordial pain: Secondary | ICD-10-CM | POA: Insufficient documentation

## 2023-03-27 LAB — ECHOCARDIOGRAM COMPLETE
Area-P 1/2: 7.02 cm2
Est EF: 50
S' Lateral: 3.5 cm

## 2023-04-02 ENCOUNTER — Emergency Department (HOSPITAL_COMMUNITY): Payer: Medicare (Managed Care)

## 2023-04-02 ENCOUNTER — Encounter (HOSPITAL_COMMUNITY): Payer: Self-pay

## 2023-04-02 ENCOUNTER — Other Ambulatory Visit: Payer: Self-pay

## 2023-04-02 ENCOUNTER — Observation Stay (HOSPITAL_COMMUNITY)
Admission: EM | Admit: 2023-04-02 | Discharge: 2023-04-06 | Disposition: A | Discharge status: Home / Self Care | Payer: Medicare (Managed Care) | Attending: Internal Medicine | Admitting: Internal Medicine

## 2023-04-02 DIAGNOSIS — N179 Acute kidney failure, unspecified: Secondary | ICD-10-CM | POA: Insufficient documentation

## 2023-04-02 DIAGNOSIS — Z87891 Personal history of nicotine dependence: Secondary | ICD-10-CM | POA: Diagnosis not present

## 2023-04-02 DIAGNOSIS — D696 Thrombocytopenia, unspecified: Secondary | ICD-10-CM | POA: Insufficient documentation

## 2023-04-02 DIAGNOSIS — Z8739 Personal history of other diseases of the musculoskeletal system and connective tissue: Secondary | ICD-10-CM | POA: Diagnosis not present

## 2023-04-02 DIAGNOSIS — Z85520 Personal history of malignant carcinoid tumor of kidney: Secondary | ICD-10-CM | POA: Diagnosis not present

## 2023-04-02 DIAGNOSIS — K5289 Other specified noninfective gastroenteritis and colitis: Principal | ICD-10-CM

## 2023-04-02 DIAGNOSIS — N184 Chronic kidney disease, stage 4 (severe): Secondary | ICD-10-CM | POA: Insufficient documentation

## 2023-04-02 DIAGNOSIS — E785 Hyperlipidemia, unspecified: Secondary | ICD-10-CM | POA: Insufficient documentation

## 2023-04-02 DIAGNOSIS — Z79899 Other long term (current) drug therapy: Secondary | ICD-10-CM | POA: Insufficient documentation

## 2023-04-02 DIAGNOSIS — Z7901 Long term (current) use of anticoagulants: Secondary | ICD-10-CM | POA: Insufficient documentation

## 2023-04-02 DIAGNOSIS — I10 Essential (primary) hypertension: Secondary | ICD-10-CM | POA: Diagnosis present

## 2023-04-02 DIAGNOSIS — Z85528 Personal history of other malignant neoplasm of kidney: Secondary | ICD-10-CM

## 2023-04-02 DIAGNOSIS — D649 Anemia, unspecified: Secondary | ICD-10-CM | POA: Insufficient documentation

## 2023-04-02 DIAGNOSIS — K6289 Other specified diseases of anus and rectum: Principal | ICD-10-CM | POA: Diagnosis present

## 2023-04-02 DIAGNOSIS — I129 Hypertensive chronic kidney disease with stage 1 through stage 4 chronic kidney disease, or unspecified chronic kidney disease: Secondary | ICD-10-CM | POA: Insufficient documentation

## 2023-04-02 LAB — CBC WITH DIFFERENTIAL/PLATELET
Abs Immature Granulocytes: 0.01 10*3/uL (ref 0.00–0.07)
Basophils Absolute: 0 10*3/uL (ref 0.0–0.1)
Basophils Relative: 0 %
Eosinophils Absolute: 0.1 10*3/uL (ref 0.0–0.5)
Eosinophils Relative: 1 %
HCT: 38.3 % — ABNORMAL LOW (ref 39.0–52.0)
Hemoglobin: 11.9 g/dL — ABNORMAL LOW (ref 13.0–17.0)
Immature Granulocytes: 0 %
Lymphocytes Relative: 19 %
Lymphs Abs: 1.3 10*3/uL (ref 0.7–4.0)
MCH: 28.7 pg (ref 26.0–34.0)
MCHC: 31.1 g/dL (ref 30.0–36.0)
MCV: 92.5 fL (ref 80.0–100.0)
Monocytes Absolute: 0.6 10*3/uL (ref 0.1–1.0)
Monocytes Relative: 8 %
Neutro Abs: 5 10*3/uL (ref 1.7–7.7)
Neutrophils Relative %: 72 %
Platelets: 132 10*3/uL — ABNORMAL LOW (ref 150–400)
RBC: 4.14 MIL/uL — ABNORMAL LOW (ref 4.22–5.81)
RDW: 16.1 % — ABNORMAL HIGH (ref 11.5–15.5)
WBC: 7 10*3/uL (ref 4.0–10.5)
nRBC: 0 % (ref 0.0–0.2)

## 2023-04-02 LAB — COMPREHENSIVE METABOLIC PANEL
ALT: 27 U/L (ref 0–44)
AST: 34 U/L (ref 15–41)
Albumin: 3.6 g/dL (ref 3.5–5.0)
Alkaline Phosphatase: 41 U/L (ref 38–126)
Anion gap: 10 (ref 5–15)
BUN: 29 mg/dL — ABNORMAL HIGH (ref 8–23)
CO2: 21 mmol/L — ABNORMAL LOW (ref 22–32)
Calcium: 9 mg/dL (ref 8.9–10.3)
Chloride: 109 mmol/L (ref 98–111)
Creatinine, Ser: 3.59 mg/dL — ABNORMAL HIGH (ref 0.61–1.24)
GFR, Estimated: 18 mL/min — ABNORMAL LOW (ref >60)
Glucose, Bld: 138 mg/dL — ABNORMAL HIGH (ref 70–99)
Potassium: 4.4 mmol/L (ref 3.5–5.1)
Sodium: 140 mmol/L (ref 135–145)
Total Bilirubin: 0.6 mg/dL (ref 0.0–1.2)
Total Protein: 7.1 g/dL (ref 6.5–8.1)

## 2023-04-02 LAB — LIPASE, BLOOD: Lipase: 32 U/L (ref 11–51)

## 2023-04-02 MED ORDER — BISACODYL 10 MG RE SUPP
10.0000 mg | Freq: Once | RECTAL | Status: AC
  Administered 2023-04-02: 10 mg via RECTAL
  Filled 2023-04-02: qty 1

## 2023-04-02 MED ORDER — FENTANYL CITRATE PF 50 MCG/ML IJ SOSY
100.0000 ug | PREFILLED_SYRINGE | Freq: Once | INTRAMUSCULAR | Status: AC
Start: 2023-04-02 — End: 2023-04-02
  Administered 2023-04-02: 100 ug via INTRAVENOUS
  Filled 2023-04-02: qty 2

## 2023-04-02 MED ORDER — SODIUM CHLORIDE 0.9 % IV BOLUS
500.0000 mL | Freq: Once | INTRAVENOUS | Status: AC
  Administered 2023-04-02: 500 mL via INTRAVENOUS

## 2023-04-02 NOTE — ED Triage Notes (Signed)
Pt arrives via EMS from Home. Pt reports constipation and abdominal pain for over 1 week. Pt is AxOx4.    EMS administered of Fentanyl.

## 2023-04-02 NOTE — ED Provider Notes (Signed)
[place] EMERGENCY DEPARTMENT AT [facility] Provider Note   CSN: [id number] Arrival date & time: 04/02/23  1737     History  Chief Complaint  Patient presents with   Constipation   Abdominal Pain    Brian Lawson is a 68 y.o. male.  Patient with history of CKD, HTN, Hep B, arthritis presents with abdominal discomfort and rectal pain. Reports his last bowel movement was 5 days ago despite use of lactulose today. Today with nausea but no vomiting. No fever. No blood per rectum.   The history is provided by the patient. No language interpreter was used.  Constipation Associated symptoms: abdominal pain   Abdominal Pain Associated symptoms: constipation        Home Medications Prior to Admission medications   Medication Sig Start Date End Date Taking? Authorizing Provider  allopurinol (ZYLOPRIM) 100 MG tablet Take 150 mg by mouth daily. 01/23/23   [provider]  aspirin EC 81 MG tablet Take 1 tablet (81 mg total) by mouth daily. Swallow whole. 02/19/23   Orbie Pyo, MD  cholecalciferol (VITAMIN D) 1000 UNITS tablet Take 1,000 Units by mouth daily.    [provider]  cloNIDine (CATAPRES) 0.2 MG tablet Take 0.2 mg by mouth 3 (three) times daily. 01/25/13   Viyuoh, Rolland Bimler, MD  colchicine 0.6 MG tablet Take 0.3 mg by mouth daily. 02/11/23   [provider]  hydrALAZINE (APRESOLINE) 50 MG tablet Take 50 mg by mouth 3 (three) times daily. Patient states he takes a 25mg  TID as well per patient    [provider]  lactulose (CHRONULAC) 10 GM/15ML solution SMARTSIG:Milliliter(s) By Mouth 02/14/23   [provider]  Probiotic, Lactobacillus, CAPS Take 1 capsule by mouth daily. 03/11/20   Ward, Layla Maw, DO  rosuvastatin (CRESTOR) 10 MG tablet Take 10 mg by mouth daily. 02/11/23   [provider]      Allergies    Patient has no known allergies.    Review of Systems   Review of Systems  Gastrointestinal:   Positive for abdominal pain and constipation.    Physical Exam Updated Vital Signs BP (!) 176/99 (BP Location: Left Arm)   Pulse (!) 123   Temp 97.9 F (36.6 C) (Oral)   Resp 20   SpO2 98%  Physical Exam Vitals and nursing note reviewed.  Constitutional:      Appearance: He is well-developed.  Pulmonary:     Effort: Pulmonary effort is normal.  Abdominal:     General: There is no distension.     Palpations: Abdomen is soft.     Tenderness: There is generalized abdominal tenderness (Mild).  Genitourinary:    Comments: Large quantity hard stool in the rectum.  Musculoskeletal:        General: Normal range of motion.     Cervical back: Normal range of motion.  Skin:    General: Skin is warm and dry.  Neurological:     Mental Status: He is alert and oriented to person, place, and time.     ED Results / Procedures / Treatments   Labs (all labs ordered are listed, but only abnormal results are displayed) Labs Reviewed  CBC WITH DIFFERENTIAL/PLATELET  LIPASE, BLOOD  COMPREHENSIVE METABOLIC PANEL  URINALYSIS, ROUTINE W REFLEX MICROSCOPIC   Results for orders placed or performed during the hospital encounter of 04/02/23  CBC with Differential   Collection Time: 04/02/23  6:41 PM  Result Value Ref Range   WBC  7.0 4.0 - 10.5 K/uL   RBC 4.14 (L) 4.22 - 5.81 MIL/uL   Hemoglobin 11.9 (L) 13.0 - 17.0 g/dL   HCT 62.9 (L) 52.8 - 41.3 %   MCV 92.5 80.0 - 100.0 fL   MCH 28.7 26.0 - 34.0 pg   MCHC 31.1 30.0 - 36.0 g/dL   RDW 24.4 (H) 01.0 - 27.2 %   Platelets 132 (L) 150 - 400 K/uL   nRBC 0.0 0.0 - 0.2 %   Neutrophils Relative % 72 %   Neutro Abs 5.0 1.7 - 7.7 K/uL   Lymphocytes Relative 19 %   Lymphs Abs 1.3 0.7 - 4.0 K/uL   Monocytes Relative 8 %   Monocytes Absolute 0.6 0.1 - 1.0 K/uL   Eosinophils Relative 1 %   Eosinophils Absolute 0.1 0.0 - 0.5 K/uL   Basophils Relative 0 %   Basophils Absolute 0.0 0.0 - 0.1 K/uL   Immature Granulocytes 0 %   Abs Immature  Granulocytes 0.01 0.00 - 0.07 K/uL  Lipase, blood   Collection Time: 04/02/23  6:41 PM  Result Value Ref Range   Lipase 32 11 - 51 U/L  Comprehensive metabolic panel   Collection Time: 04/02/23  6:41 PM  Result Value Ref Range   Sodium 140 135 - 145 mmol/L   Potassium 4.4 3.5 - 5.1 mmol/L   Chloride 109 98 - 111 mmol/L   CO2 21 (L) 22 - 32 mmol/L   Glucose, Bld 138 (H) 70 - 99 mg/dL   BUN 29 (H) 8 - 23 mg/dL   Creatinine, Ser 5.36 (H) 0.61 - 1.24 mg/dL   Calcium 9.0 8.9 - 64.4 mg/dL   Total Protein 7.1 6.5 - 8.1 g/dL   Albumin 3.6 3.5 - 5.0 g/dL   AST 34 15 - 41 U/L   ALT 27 0 - 44 U/L   Alkaline Phosphatase 41 38 - 126 U/L   Total Bilirubin 0.6 0.0 - 1.2 mg/dL   GFR, Estimated 18 (L) >60 mL/min   Anion gap 10 5 - 15     EKG None  Radiology No results found.  Procedures .Fecal disimpaction  Date/Time: 04/02/2023 6:09 PM  Performed by: Elpidio Anis, PA-C Authorized by: Elpidio Anis, PA-C  Consent: Verbal consent obtained.  Sedation: Patient sedated: no  Comments: Moderate amount of hard, brown stool removed from rectum to finger tip depth.        Medications Ordered in ED Medications - No data to display  ED Course/ Medical Decision Making/ A&P Clinical Course as of 04/04/23 0644  Wed Apr 02, 2023  1919 Manual disimpaction of large quantity of brown stool successful, but not complete. Patient requests a break from treatment due to pain.  [SU]  2026 Attempt to continue manual disimpaction unsuccessful as stool is beyond finger depth and patient intolerance. Will provide pain relief and attempt to pass a foley catheter to assist.  [SU]  2140 Unable to pass foley past stool impaction. Suppository ordered. CT pending, r/o stercoral colitis, perforation, obstruction. Noncontrast study in the setting of CKD.  Pain improved with Fentanyl. Will continue pain control.  [SU]  618 68 year old male here with constipation and rectal pain.  Was manually disimpacted  but still very uncomfortable.  Getting CT.  May need admission to the hospital for further management. [MB]  2340 Patient's care signed out to Dr. Preston Fleeting to follow-up on CT and ultimate disposition [MB]    Clinical Course User Index [MB] Terrilee Files, MD [SU]  Elpidio Anis, PA-C                                 Medical Decision Making Amount and/or Complexity of Data Reviewed Labs: ordered. Radiology: ordered.  Risk OTC drugs. Prescription drug management. Decision regarding hospitalization.           Final Clinical Impression(s) / ED Diagnoses Final diagnoses:  None    Rx / DC Orders ED Discharge Orders     None         Elpidio Anis, PA-C 04/04/23 0646    Terrilee Files, MD 04/04/23 [id number]

## 2023-04-02 NOTE — ED Notes (Signed)
Assumed pt care from [facility]

## 2023-04-02 NOTE — ED Provider Notes (Signed)
Care assumed from Dr. Charm Barges, patient with constipation, no relief from disimpaction, pending CT of abdomen/pelvis.  CT scan shows circumferential wall thickening of the rectum and perirectal inflammatory stranding.  I have independently viewed the images, and agree with the radiologist's interpretation.  It does appear that the rectal vault had been cleared of stool but this appears to be stercoral colitis.  I have ordered ciprofloxacin and metronidazole.  I have discussed the case with Dr. Toniann Fail of [facility], who agrees to admit the patient.  Results for orders placed or performed during the hospital encounter of 04/02/23  CBC with Differential   Collection Time: 04/02/23  6:41 PM  Result Value Ref Range   WBC 7.0 4.0 - 10.5 K/uL   RBC 4.14 (L) 4.22 - 5.81 MIL/uL   Hemoglobin 11.9 (L) 13.0 - 17.0 g/dL   HCT 16.1 (L) 09.6 - 04.5 %   MCV 92.5 80.0 - 100.0 fL   MCH 28.7 26.0 - 34.0 pg   MCHC 31.1 30.0 - 36.0 g/dL   RDW 40.9 (H) 81.1 - 91.4 %   Platelets 132 (L) 150 - 400 K/uL   nRBC 0.0 0.0 - 0.2 %   Neutrophils Relative % 72 %   Neutro Abs 5.0 1.7 - 7.7 K/uL   Lymphocytes Relative 19 %   Lymphs Abs 1.3 0.7 - 4.0 K/uL   Monocytes Relative 8 %   Monocytes Absolute 0.6 0.1 - 1.0 K/uL   Eosinophils Relative 1 %   Eosinophils Absolute 0.1 0.0 - 0.5 K/uL   Basophils Relative 0 %   Basophils Absolute 0.0 0.0 - 0.1 K/uL   Immature Granulocytes 0 %   Abs Immature Granulocytes 0.01 0.00 - 0.07 K/uL  Lipase, blood   Collection Time: 04/02/23  6:41 PM  Result Value Ref Range   Lipase 32 11 - 51 U/L  Comprehensive metabolic panel   Collection Time: 04/02/23  6:41 PM  Result Value Ref Range   Sodium 140 135 - 145 mmol/L   Potassium 4.4 3.5 - 5.1 mmol/L   Chloride 109 98 - 111 mmol/L   CO2 21 (L) 22 - 32 mmol/L   Glucose, Bld 138 (H) 70 - 99 mg/dL   BUN 29 (H) 8 - 23 mg/dL   Creatinine, Ser 7.82 (H) 0.61 - 1.24 mg/dL   Calcium 9.0 8.9 - 95.6 mg/dL   Total Protein 7.1 6.5 -  8.1 g/dL   Albumin 3.6 3.5 - 5.0 g/dL   AST 34 15 - 41 U/L   ALT 27 0 - 44 U/L   Alkaline Phosphatase 41 38 - 126 U/L   Total Bilirubin 0.6 0.0 - 1.2 mg/dL   GFR, Estimated 18 (L) >60 mL/min   Anion gap 10 5 - 15   CT ABDOMEN PELVIS WO CONTRAST Result Date: 04/03/2023 CLINICAL DATA:  Acute nonlocalized abdominal pain EXAM: CT ABDOMEN AND PELVIS WITHOUT CONTRAST TECHNIQUE: Multidetector CT imaging of the abdomen and pelvis was performed following the standard protocol without IV contrast. RADIATION DOSE REDUCTION: This exam was performed according to the departmental dose-optimization program which includes automated exposure control, adjustment of the mA and/or kV according to patient size and/or use of iterative reconstruction technique. COMPARISON:  03/11/2020 FINDINGS: Lower chest: No acute abnormality. Hepatobiliary: No focal liver abnormality is seen. No gallstones, gallbladder wall thickening, or biliary dilatation. Pancreas: Unremarkable Spleen: Unremarkable Adrenals/Urinary Tract: Status post left radical nephrectomy. The right adrenal gland and kidney are unremarkable save for 2 separate 3 mm nonobstructing  calculi within the interpolar region. Bladder is unremarkable. Stomach/Bowel: There is circumferential wall thickening involving the terminal rectum and mild perirectal inflammatory stranding suggesting changes of an infectious or inflammatory proctitis. The stomach, small bowel, large bowel are otherwise unremarkable. Appendix normal. No free intraperitoneal gas or fluid. Vascular/Lymphatic: Moderate aortoiliac atherosclerotic calcification. No pathologic adenopathy within the abdomen and pelvis. Reproductive: Prostate is unremarkable. Other: Tiny fat containing umbilical hernia. Stable 15 mm subcutaneous soft tissue nodule left anterior mid abdominal wall, safely considered benign Musculoskeletal: Degenerative changes are seen within the lumbar spine. No acute bone abnormality. No lytic or  blastic bone lesion. IMPRESSION: 1. Circumferential wall thickening involving the terminal rectum and mild perirectal inflammatory stranding suggesting changes of an infectious or inflammatory proctitis. 2. Status post left radical nephrectomy. 3. Nonobstructing right nephrolithiasis. 4. Moderate aortoiliac atherosclerotic calcification. Aortic Atherosclerosis (ICD10-I70.0). Electronically Signed   By: Helyn Numbers M.D.   On: 04/03/2023 01:13   ECHOCARDIOGRAM COMPLETE Result Date: 03/27/2023    ECHOCARDIOGRAM REPORT   Patient Name:   Brian Lawson Date of Exam: 03/27/2023 Medical Rec #:  [id number]     Height:       75.0 in Accession #:    [id number]    Weight:       196.0 lb Date of Birth:  1956-01-10     BSA:          2.176 m Patient Age:    68 years      BP:           129/88 mmHg Patient Gender: M             HR:           90 bpm. Exam Location:  [address] Procedure: 2D Echo, 3D Echo, Cardiac Doppler and Color Doppler Indications:    R07.2 Chest Pain  History:        Patient has no prior history of Echocardiogram examinations.                 CKD; Risk Factors:Hypertension.  Sonographer:    Clearence Ped RCS Referring Phys: [id number] Orbie Pyo IMPRESSIONS  1. Left ventricular ejection fraction, by estimation, is 50%. The left ventricle has low normal function. The left ventricle has no regional wall motion abnormalities. Left ventricular diastolic parameters are indeterminate. There is pseudodyskinesis of  the inferolateral wall.  2. Right ventricular systolic function is normal. The right ventricular size is normal. There is normal pulmonary artery systolic pressure. The estimated right ventricular systolic pressure is 30.0 mmHg.  3. The mitral valve is normal in structure. No evidence of mitral valve regurgitation. No evidence of mitral stenosis.  4. The aortic valve is tricuspid. Aortic valve regurgitation is trivial. No aortic stenosis is present.  5. The inferior vena cava is normal in size with  greater than 50% respiratory variability, suggesting right atrial pressure of 3 mmHg. FINDINGS  Left Ventricle: Left ventricular ejection fraction, by estimation, is 50%. The left ventricle has low normal function. The left ventricle has no regional wall motion abnormalities. 3D ejection fraction reviewed and evaluated as part of the interpretation. Alternate measurement of EF is felt to be most reflective of LV function. The left ventricular internal cavity size was normal in size. There is no left ventricular hypertrophy. Pseudodyskinesis of the inferolateral wall. Left ventricular  diastolic parameters are indeterminate. Right Ventricle: The right ventricular size is normal. No increase in right ventricular wall thickness. Right ventricular systolic function is normal. There  is normal pulmonary artery systolic pressure. The tricuspid regurgitant velocity is 2.60 m/s, and  with an assumed right atrial pressure of 3 mmHg, the estimated right ventricular systolic pressure is 30.0 mmHg. Left Atrium: Left atrial size was normal in size. Right Atrium: Right atrial size was normal in size. Pericardium: There is no evidence of pericardial effusion. Mitral Valve: The mitral valve is normal in structure. No evidence of mitral valve regurgitation. No evidence of mitral valve stenosis. Tricuspid Valve: The tricuspid valve is normal in structure. Tricuspid valve regurgitation is trivial. No evidence of tricuspid stenosis. Aortic Valve: The aortic valve is tricuspid. Aortic valve regurgitation is trivial. No aortic stenosis is present. Pulmonic Valve: The pulmonic valve was grossly normal. Pulmonic valve regurgitation is mild to moderate. No evidence of pulmonic stenosis. Aorta: The aortic root is normal in size and structure. Venous: The inferior vena cava is normal in size with greater than 50% respiratory variability, suggesting right atrial pressure of 3 mmHg. IAS/Shunts: No atrial level shunt detected by color flow  Doppler.  LEFT VENTRICLE PLAX 2D LVIDd:         4.40 cm   Diastology LVIDs:         3.50 cm   LV e' medial:    4.03 cm/s LV PW:         0.90 cm   LV E/e' medial:  15.2 LV IVS:        0.90 cm   LV e' lateral:   7.94 cm/s LVOT diam:     2.20 cm   LV E/e' lateral: 7.7 LV SV:         54 LV SV Index:   25 LVOT Area:     3.80 cm                           3D Volume EF:                          3D EF:        42 %                          LV EDV:       134 ml                          LV ESV:       78 ml                          LV SV:        56 ml RIGHT VENTRICLE RV Basal diam:  3.90 cm RV S prime:     9.79 cm/s TAPSE (M-mode): 2.1 cm RVSP:           30.0 mmHg LEFT ATRIUM             Index        RIGHT ATRIUM           Index LA diam:        3.70 cm 1.70 cm/m   RA Pressure: 3.00 mmHg LA Vol (A2C):   40.7 ml 18.71 ml/m  RA Area:     11.00 cm LA Vol (A4C):   33.1 ml 15.21 ml/m  RA Volume:   26.20 ml  12.04 ml/m LA Biplane Vol: 38.7 ml 17.79 ml/m  AORTIC VALVE LVOT Vmax:   77.60 cm/s LVOT Vmean:  51.900 cm/s LVOT VTI:    0.142 m  AORTA Ao Root diam: 3.50 cm Ao Asc diam:  3.10 cm MITRAL VALVE                TRICUSPID VALVE MV Area (PHT):              TR Peak grad:   27.0 mmHg MV Decel Time:              TR Vmax:        260.00 cm/s MV E velocity: 61.30 cm/s   Estimated RAP:  3.00 mmHg MV A velocity: 108.00 cm/s  RVSP:           30.0 mmHg MV E/A ratio:  0.57                             SHUNTS                             Systemic VTI:  0.14 m                             Systemic Diam: 2.20 cm Weston Brass MD Electronically signed by Weston Brass MD Signature Date/Time: 03/27/2023/10:35:04 PM    Final       Dione Booze, MD 04/03/23 [phone]

## 2023-04-02 NOTE — ED Notes (Signed)
Pt made aware urine is needed but is unable to provide sample at this time

## 2023-04-03 ENCOUNTER — Encounter (HOSPITAL_COMMUNITY): Payer: Self-pay | Admitting: Internal Medicine

## 2023-04-03 ENCOUNTER — Emergency Department (HOSPITAL_COMMUNITY): Payer: Medicare (Managed Care)

## 2023-04-03 DIAGNOSIS — N189 Chronic kidney disease, unspecified: Secondary | ICD-10-CM | POA: Insufficient documentation

## 2023-04-03 DIAGNOSIS — K6289 Other specified diseases of anus and rectum: Secondary | ICD-10-CM | POA: Diagnosis present

## 2023-04-03 DIAGNOSIS — D696 Thrombocytopenia, unspecified: Secondary | ICD-10-CM | POA: Insufficient documentation

## 2023-04-03 DIAGNOSIS — K5289 Other specified noninfective gastroenteritis and colitis: Secondary | ICD-10-CM

## 2023-04-03 DIAGNOSIS — Z8739 Personal history of other diseases of the musculoskeletal system and connective tissue: Secondary | ICD-10-CM

## 2023-04-03 DIAGNOSIS — I10 Essential (primary) hypertension: Secondary | ICD-10-CM

## 2023-04-03 DIAGNOSIS — N179 Acute kidney failure, unspecified: Secondary | ICD-10-CM | POA: Insufficient documentation

## 2023-04-03 DIAGNOSIS — D649 Anemia, unspecified: Secondary | ICD-10-CM | POA: Insufficient documentation

## 2023-04-03 DIAGNOSIS — N184 Chronic kidney disease, stage 4 (severe): Secondary | ICD-10-CM

## 2023-04-03 LAB — COMPREHENSIVE METABOLIC PANEL
ALT: 23 U/L (ref 0–44)
AST: 40 U/L (ref 15–41)
Albumin: 2.6 g/dL — ABNORMAL LOW (ref 3.5–5.0)
Alkaline Phosphatase: 30 U/L — ABNORMAL LOW (ref 38–126)
Anion gap: 6 (ref 5–15)
BUN: 27 mg/dL — ABNORMAL HIGH (ref 8–23)
CO2: 18 mmol/L — ABNORMAL LOW (ref 22–32)
Calcium: 6.9 mg/dL — ABNORMAL LOW (ref 8.9–10.3)
Chloride: 112 mmol/L — ABNORMAL HIGH (ref 98–111)
Creatinine, Ser: 2.94 mg/dL — ABNORMAL HIGH (ref 0.61–1.24)
GFR, Estimated: 22 mL/min — ABNORMAL LOW (ref >60)
Glucose, Bld: 94 mg/dL (ref 70–99)
Potassium: 3.9 mmol/L (ref 3.5–5.1)
Sodium: 136 mmol/L (ref 135–145)
Total Bilirubin: 0.7 mg/dL (ref 0.0–1.2)
Total Protein: 5.1 g/dL — ABNORMAL LOW (ref 6.5–8.1)

## 2023-04-03 LAB — TSH: TSH: 0.751 u[IU]/mL (ref 0.350–4.500)

## 2023-04-03 LAB — URINALYSIS, ROUTINE W REFLEX MICROSCOPIC
Bacteria, UA: NONE SEEN
Bilirubin Urine: NEGATIVE
Glucose, UA: NEGATIVE mg/dL
Hgb urine dipstick: NEGATIVE
Ketones, ur: NEGATIVE mg/dL
Leukocytes,Ua: NEGATIVE
Nitrite: NEGATIVE
Protein, ur: 30 mg/dL — AB
Specific Gravity, Urine: 1.015 (ref 1.005–1.030)
pH: 6 (ref 5.0–8.0)

## 2023-04-03 LAB — HIV ANTIBODY (ROUTINE TESTING W REFLEX): HIV Screen 4th Generation wRfx: NONREACTIVE

## 2023-04-03 MED ORDER — LACTULOSE 10 GM/15ML PO SOLN: 20.0000 g | Freq: Every day | ORAL | Status: DC | PRN

## 2023-04-03 MED ORDER — ALLOPURINOL 100 MG PO TABS
150.0000 mg | ORAL_TABLET | Freq: Every day | ORAL | Status: DC
  Administered 2023-04-03 – 2023-04-05 (×3): 150 mg via ORAL
  Filled 2023-04-03 (×3): qty 2

## 2023-04-03 MED ORDER — SODIUM CHLORIDE 0.9 % IV SOLN: INTRAVENOUS | Status: DC

## 2023-04-03 MED ORDER — RENA-VITE PO TABS
1.0000 | ORAL_TABLET | Freq: Every day | ORAL | Status: DC
  Administered 2023-04-03 – 2023-04-06 (×4): 1 via ORAL
  Filled 2023-04-03 (×5): qty 1

## 2023-04-03 MED ORDER — BISACODYL 10 MG RE SUPP: 10.0000 mg | Freq: Every day | RECTAL | Status: DC | PRN

## 2023-04-03 MED ORDER — ACETAMINOPHEN 325 MG PO TABS
650.0000 mg | ORAL_TABLET | Freq: Four times a day (QID) | ORAL | Status: DC | PRN
Start: 2023-04-03 — End: 2023-04-06

## 2023-04-03 MED ORDER — LINACLOTIDE 145 MCG PO CAPS
290.0000 ug | ORAL_CAPSULE | Freq: Every day | ORAL | Status: DC
  Administered 2023-04-03 – 2023-04-06 (×4): 290 ug via ORAL
  Filled 2023-04-03 (×4): qty 2

## 2023-04-03 MED ORDER — METRONIDAZOLE 500 MG/100ML IV SOLN
500.0000 mg | Freq: Two times a day (BID) | INTRAVENOUS | Status: DC
  Administered 2023-04-03 – 2023-04-06 (×7): 500 mg via INTRAVENOUS
  Filled 2023-04-03 (×7): qty 100

## 2023-04-03 MED ORDER — HYDRALAZINE HCL 50 MG PO TABS
50.0000 mg | ORAL_TABLET | Freq: Three times a day (TID) | ORAL | Status: DC
Start: 2023-04-03 — End: 2023-04-06
  Administered 2023-04-03 – 2023-04-06 (×10): 50 mg via ORAL
  Filled 2023-04-03 (×2): qty 1
  Filled 2023-04-03: qty 2
  Filled 2023-04-03 (×7): qty 1

## 2023-04-03 MED ORDER — METRONIDAZOLE 500 MG/100ML IV SOLN
500.0000 mg | Freq: Once | INTRAVENOUS | Status: AC
  Administered 2023-04-03: 500 mg via INTRAVENOUS
  Filled 2023-04-03: qty 100

## 2023-04-03 MED ORDER — CLONIDINE HCL 0.1 MG PO TABS
0.2000 mg | ORAL_TABLET | Freq: Three times a day (TID) | ORAL | Status: DC
  Administered 2023-04-03 – 2023-04-06 (×10): 0.2 mg via ORAL
  Filled 2023-04-03 (×8): qty 2
  Filled 2023-04-03: qty 1
  Filled 2023-04-03: qty 2

## 2023-04-03 MED ORDER — HEPARIN SODIUM (PORCINE) 5000 UNIT/ML IJ SOLN
5000.0000 [IU] | Freq: Three times a day (TID) | INTRAMUSCULAR | Status: DC
  Administered 2023-04-03 – 2023-04-06 (×5): 5000 [IU] via SUBCUTANEOUS
  Filled 2023-04-03 (×8): qty 1

## 2023-04-03 MED ORDER — ACETAMINOPHEN 650 MG RE SUPP: 650.0000 mg | Freq: Four times a day (QID) | RECTAL | Status: DC | PRN

## 2023-04-03 MED ORDER — CIPROFLOXACIN IN D5W 400 MG/200ML IV SOLN
400.0000 mg | INTRAVENOUS | Status: DC
  Administered 2023-04-03 – 2023-04-06 (×3): 400 mg via INTRAVENOUS
  Filled 2023-04-03 (×5): qty 200

## 2023-04-03 MED ORDER — ROSUVASTATIN CALCIUM 5 MG PO TABS
10.0000 mg | ORAL_TABLET | Freq: Every day | ORAL | Status: DC
  Administered 2023-04-03 – 2023-04-05 (×3): 10 mg via ORAL
  Filled 2023-04-03 (×3): qty 2

## 2023-04-03 MED ORDER — SODIUM CHLORIDE 0.9 % IV SOLN: INTRAVENOUS | Status: AC

## 2023-04-03 MED ORDER — CIPROFLOXACIN IN D5W 400 MG/200ML IV SOLN
400.0000 mg | Freq: Once | INTRAVENOUS | Status: AC
  Administered 2023-04-03: 400 mg via INTRAVENOUS
  Filled 2023-04-03: qty 200

## 2023-04-03 MED ORDER — LACTULOSE 10 GM/15ML PO SOLN: 15.0000 g | ORAL | Status: DC

## 2023-04-03 NOTE — Progress Notes (Signed)
Pharmacy Antibiotic Note  Brian Lawson is a 68 y.o. male admitted on 04/02/2023 with  intra-abdominal infection .  Pharmacy has been consulted for Cipro dosing. WBC WNL. Noted renal dysfunction.   Plan: Cipro 400 mg IV q24h Flagyl per MD  Temp (24hrs), Avg:97.9 F (36.6 C), Min:97.9 F (36.6 C), Max:97.9 F (36.6 C)  Recent Labs  Lab 04/02/23 1841  WBC 7.0  CREATININE 3.59*    CrCl cannot be calculated (Unknown ideal weight.).    No Known Allergies  Abran Duke, PharmD, BCPS Clinical Pharmacist Phone: [phone]

## 2023-04-03 NOTE — ED Notes (Addendum)
Denies pain, nausea, sob, HA or other sx. Mentions hunger. Wanting snack until meal delivered. Denies questions, needs, sx or complaints, except hunger.

## 2023-04-03 NOTE — Plan of Care (Signed)

## 2023-04-03 NOTE — Progress Notes (Signed)
Patient seen and examined, admitted by Dr. Toniann Fail this AM.  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 Rhea Belton) presented to ED with increasing rectal pain and severe constipation.  Patient reported that he had not had a BM for a week and a half, has been having worsening rectal pain.  No nausea vomiting, fevers, hematochezia or melena.   Labs showed creatinine 3.59 on admission, baseline 2.8-3.0  CT abdomen showed no bowel obstruction, circumferential wall thickening involving the terminal rectum and mild perirectal inflammatory stranding suggesting infectious or inflammatory proctitis.  BP (!) 148/74   Pulse 66   Temp 99.1 F (37.3 C) (Oral)   Resp 16   SpO2 100%   Physical Exam General: Alert and oriented x 3, NAD Cardiovascular: S1 S2 clear, RRR.  Respiratory: CTAB, no wheezing, rales or rhonchi Gastrointestinal: Soft, nontender, nondistended, NBS Ext: no pedal edema bilaterally Neuro: no new deficits Skin: No rashes Psych: Normal affect    A/p  Severe constipation with proctitis -Patient has history of chronic constipation, states over-the-counter medications like Colace, senna does not work and MiraLAX causes GI upset.  Takes lactulose 2-3 times a week however not helping. -Last colonoscopy in 2023 had shown moderate diverticulosis, polyps -Placed on scheduled Linzess 290 mcg daily, lactulose 20 g p.o. daily as needed if no BM in the 2 days, Dulcolax suppository daily PRN -Recommended outpatient close follow-ups with GI -Continue IV ciprofloxacin, Flagyl.  TSH 0.7  Acute kidney injury on CKD stage IV, history of prior nephrectomy for renal cell CA - creatinine 3.59 on admission, baseline 2.8-3.0 -Continue IV fluid hydration -Creatinine improving   Armonee Bojanowski M.D.  [facility] 04/03/2023, 9:52 AM

## 2023-04-03 NOTE — ED Notes (Signed)
Pt states he feels so much better after large bowel movement

## 2023-04-03 NOTE — H&P (Signed)
History and Physical    Brian Lawson HKV:[id number] DOB: Jul 14, 1955 DOA: 04/02/2023  Patient coming from: Home.  Chief Complaint: Rectal pain and constipation.  HPI: Brian Lawson is a 68 y.o. male with history of chronic kidney disease stage IV with single functioning kidney with prior history of nephrectomy for renal cell carcinoma, history of gout, history of hypertension and history of chronic constipation being followed by gastroenterologist presents to the ER because of increasing rectal pain and constipation.  Patient states he has not moved his bowels for almost a week and a half and has been having worsening rectal pain.  Denies any blood in the stools nausea vomiting fever or chills.  ED Course: In the ER patient had to be manually disimpacted and also was given bisacodyl following which patient had large bowel movement.  CT scan shows proctitis either inflammatory or infectious.  Patient's creatinine which is usually around 2.8-3 has increased to 3.5.  Was given fluid and antibiotics admitted for further observation.  Review of Systems: As per HPI, rest all negative.   Past Medical History:  Diagnosis Date   Anemia associated with chronic renal failure    Arthritis    OA   Cancer (HCC) renal lt nephrectomy   Chronic kidney disease    ACUTE RENAL FAILURE 12/2012 STATUS POST RT ARM AV FISTULA - HAS NOT STARTED DIALYSIS; LEFT RENAL MASS   Gout    Hepatitis    30 years ago   Hypertension    Pneumonia    HOSPITALIZED 12/2012 LEGIONLLA PNEUMONIA    Past Surgical History:  Procedure Laterality Date   BASCILIC VEIN TRANSPOSITION Right 01/18/2013   Procedure: BASCILIC VEIN TRANSPOSITION;  Surgeon: Sherren Kerns, MD;  Location: [facility] OR;  Service: Vascular;  Laterality: Right;   BASCILIC VEIN TRANSPOSITION Right 04/27/2013   Procedure: BASCILIC VEIN TRANSPOSITION AND REVISION;  Surgeon: Sherren Kerns, MD;  Location: [facility] OR;  Service: Vascular;  Laterality: Right;   COLONOSCOPY  N/A 09/23/2014   Procedure: COLONOSCOPY;  Surgeon: Beverley Fiedler, MD;  Location: [facility] ENDOSCOPY;  Service: Endoscopy;  Laterality: N/A;   HERNIA REPAIR     LAPAROSCOPIC NEPHRECTOMY Left 05/28/2013   Procedure: LEFT LAPAROSCOPIC  RADICAL NEPHRECTOMY;  Surgeon: Crist Fat, MD;  Location: [facility] ORS;  Service: Urology;  Laterality: Left;     reports that he has quit smoking. He has never used smokeless tobacco. He reports that he does not currently use alcohol. He reports that he does not currently use drugs.  No Known Allergies  Family History  Problem Relation Age of Onset   Hypertension Mother    Cancer Father        throat cancer   Hypertension Sister     Prior to Admission medications   Medication Sig Start Date End Date Taking? Authorizing Provider  allopurinol (ZYLOPRIM) 300 MG tablet Take 150 mg by mouth daily. 01/23/23  Yes [provider]  aspirin EC 81 MG tablet Take 1 tablet (81 mg total) by mouth daily. Swallow whole. 02/19/23  Yes Orbie Pyo, MD  cholecalciferol (VITAMIN D) 1000 UNITS tablet Take 1,000 Units by mouth daily.   Yes [provider]  cloNIDine (CATAPRES) 0.2 MG tablet Take 0.2 mg by mouth 3 (three) times daily. 01/25/13  Yes Viyuoh, Adeline C, MD  colchicine 0.6 MG tablet Take 0.3 mg by mouth daily. 02/11/23  Yes [provider]  hydrALAZINE (APRESOLINE) 50 MG tablet Take 50 mg by mouth 3 (three) times daily.  Yes [provider]  lactulose (CHRONULAC) 10 GM/15ML solution Take 15 g by mouth 3 (three) times a week. 02/14/23  Yes [provider]  multivitamin (RENA-VIT) TABS tablet Take 1 tablet by mouth daily.   Yes [provider]  Probiotic, Lactobacillus, CAPS Take 1 capsule by mouth daily. 03/11/20  Yes Ward, Baxter Hire N, DO  rosuvastatin (CRESTOR) 10 MG tablet Take 10 mg by mouth at bedtime. 02/11/23  Yes [provider]    Physical Exam: Constitutional: Moderately built and nourished. Vitals:    04/03/23 0230 04/03/23 0300 04/03/23 0400 04/03/23 0530  BP: (!) 151/91 136/71 (!) 147/74 (!) 150/71  Pulse: 70 64 65 66  Resp: 16 15 17 14   Temp:      TempSrc:      SpO2: 100% 100% 100% 100%   Eyes: Anicteric no pallor. ENMT: No discharge from the ears eyes nose or mouth. Neck: No mass felt.  No neck rigidity. Respiratory: No rhonchi or crepitations. Cardiovascular: S1-S2 heard. Abdomen: Soft nontender bowel sounds present. Musculoskeletal: No edema. Skin: No rash. Neurologic: Alert awake oriented time place and person.  Moves all extremities. Psychiatric: Appears normal.  Normal affect.   Labs on Admission: I have personally reviewed following labs and imaging studies  CBC: Recent Labs  Lab 04/02/23 1841  WBC 7.0  NEUTROABS 5.0  HGB 11.9*  HCT 38.3*  MCV 92.5  PLT 132*   Basic Metabolic Panel: Recent Labs  Lab 04/02/23 1841  NA 140  K 4.4  CL 109  CO2 21*  GLUCOSE 138*  BUN 29*  CREATININE 3.59*  CALCIUM 9.0   GFR: CrCl cannot be calculated (Unknown ideal weight.). Liver Function Tests: Recent Labs  Lab 04/02/23 1841  AST 34  ALT 27  ALKPHOS 41  BILITOT 0.6  PROT 7.1  ALBUMIN 3.6   Recent Labs  Lab 04/02/23 1841  LIPASE 32   No results for input(s): "AMMONIA" in the last 168 hours. Coagulation Profile: No results for input(s): "INR", "PROTIME" in the last 168 hours. Cardiac Enzymes: No results for input(s): "CKTOTAL", "CKMB", "CKMBINDEX", "TROPONINI" in the last 168 hours. BNP (last 3 results) No results for input(s): "PROBNP" in the last 8760 hours. HbA1C: No results for input(s): "HGBA1C" in the last 72 hours. CBG: No results for input(s): "GLUCAP" in the last 168 hours. Lipid Profile: No results for input(s): "CHOL", "HDL", "LDLCALC", "TRIG", "CHOLHDL", "LDLDIRECT" in the last 72 hours. Thyroid Function Tests: No results for input(s): "TSH", "T4TOTAL", "FREET4", "T3FREE", "THYROIDAB" in the last 72 hours. Anemia Panel: No results  for input(s): "VITAMINB12", "FOLATE", "FERRITIN", "TIBC", "IRON", "RETICCTPCT" in the last 72 hours. Urine analysis:    Component Value Date/Time   COLORURINE YELLOW 04/03/2023 0438   APPEARANCEUR CLEAR 04/03/2023 0438   LABSPEC 1.015 04/03/2023 0438   PHURINE 6.0 04/03/2023 0438   GLUCOSEU NEGATIVE 04/03/2023 0438   HGBUR NEGATIVE 04/03/2023 0438   BILIRUBINUR NEGATIVE 04/03/2023 0438   KETONESUR NEGATIVE 04/03/2023 0438   PROTEINUR 30 (A) 04/03/2023 0438   UROBILINOGEN 0.2 12/30/2013 0208   NITRITE NEGATIVE 04/03/2023 0438   LEUKOCYTESUR NEGATIVE 04/03/2023 0438   Sepsis Labs: @LABRCNTIP (procalcitonin:4,lacticidven:4) )No results found for this or any previous visit (from the past 240 hours).   Radiological Exams on Admission: CT ABDOMEN PELVIS WO CONTRAST Result Date: 04/03/2023 CLINICAL DATA:  Acute nonlocalized abdominal pain EXAM: CT ABDOMEN AND PELVIS WITHOUT CONTRAST TECHNIQUE: Multidetector CT imaging of the abdomen and pelvis was performed following the standard protocol without IV contrast. RADIATION  DOSE REDUCTION: This exam was performed according to the departmental dose-optimization program which includes automated exposure control, adjustment of the mA and/or kV according to patient size and/or use of iterative reconstruction technique. COMPARISON:  03/11/2020 FINDINGS: Lower chest: No acute abnormality. Hepatobiliary: No focal liver abnormality is seen. No gallstones, gallbladder wall thickening, or biliary dilatation. Pancreas: Unremarkable Spleen: Unremarkable Adrenals/Urinary Tract: Status post left radical nephrectomy. The right adrenal gland and kidney are unremarkable save for 2 separate 3 mm nonobstructing calculi within the interpolar region. Bladder is unremarkable. Stomach/Bowel: There is circumferential wall thickening involving the terminal rectum and mild perirectal inflammatory stranding suggesting changes of an infectious or inflammatory proctitis. The stomach,  small bowel, large bowel are otherwise unremarkable. Appendix normal. No free intraperitoneal gas or fluid. Vascular/Lymphatic: Moderate aortoiliac atherosclerotic calcification. No pathologic adenopathy within the abdomen and pelvis. Reproductive: Prostate is unremarkable. Other: Tiny fat containing umbilical hernia. Stable 15 mm subcutaneous soft tissue nodule left anterior mid abdominal wall, safely considered benign Musculoskeletal: Degenerative changes are seen within the lumbar spine. No acute bone abnormality. No lytic or blastic bone lesion. IMPRESSION: 1. Circumferential wall thickening involving the terminal rectum and mild perirectal inflammatory stranding suggesting changes of an infectious or inflammatory proctitis. 2. Status post left radical nephrectomy. 3. Nonobstructing right nephrolithiasis. 4. Moderate aortoiliac atherosclerotic calcification. Aortic Atherosclerosis (ICD10-I70.0). Electronically Signed   By: Helyn Numbers M.D.   On: 04/03/2023 01:13     Assessment/Plan Principal Problem:   Proctitis Active Problems:   Essential hypertension   H/O renal cell cancer   Renal failure (ARF), acute on chronic (HCC)   History of gout   Thrombocytopenia (HCC)    Proctitis with history of severe constipation -    Per CAT scan reading patient's proctitis could be either inflammatory or infectious.  Suspect likely from chronic constipation.  Patient has had a colonoscopy in September 2023 by Dr. Rhea Belton, which showed moderate diverticulosis and had polyps removed.  Patient was started on Cipro and Flagyl which we will continue for now and start diet and continue lactulose and observe. Acute on chronic kidney disease stage IV with single functioning kidney prior history of nephrectomy for renal cell carcinoma gentle hydration for now.  Follow metabolic panel. Gout on allopurinol and takes 0.3 mg of colchicine.  Doses of which may need to be adjusted if there is worsening of renal  function. Hypertension on clonidine and hydralazine. Hyperlipidemia on statins. 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  Follow CBC. Thrombocytopenia appears to be new.  Follow CBC.  Since patient has proctitis with severe constipation with worsening renal function with only single functioning kidney we will need close monitoring and more than 2 midnight stay.   DVT prophylaxis: Heparin subcutaneous. Code Status: Full code. Family Communication: Discussed with patient. Disposition Plan: Medical floor. Consults called: None. Admission status: Observation.

## 2023-04-04 DIAGNOSIS — N179 Acute kidney failure, unspecified: Secondary | ICD-10-CM | POA: Diagnosis not present

## 2023-04-04 DIAGNOSIS — K6289 Other specified diseases of anus and rectum: Secondary | ICD-10-CM | POA: Diagnosis not present

## 2023-04-04 LAB — BASIC METABOLIC PANEL
Anion gap: 7 (ref 5–15)
BUN: 37 mg/dL — ABNORMAL HIGH (ref 8–23)
CO2: 20 mmol/L — ABNORMAL LOW (ref 22–32)
Calcium: 8.3 mg/dL — ABNORMAL LOW (ref 8.9–10.3)
Chloride: 112 mmol/L — ABNORMAL HIGH (ref 98–111)
Creatinine, Ser: 3.86 mg/dL — ABNORMAL HIGH (ref 0.61–1.24)
GFR, Estimated: 16 mL/min — ABNORMAL LOW (ref >60)
Glucose, Bld: 102 mg/dL — ABNORMAL HIGH (ref 70–99)
Potassium: 5.1 mmol/L (ref 3.5–5.1)
Sodium: 139 mmol/L (ref 135–145)

## 2023-04-04 LAB — CBC
HCT: 35.6 % — ABNORMAL LOW (ref 39.0–52.0)
Hemoglobin: 11.1 g/dL — ABNORMAL LOW (ref 13.0–17.0)
MCH: 28.5 pg (ref 26.0–34.0)
MCHC: 31.2 g/dL (ref 30.0–36.0)
MCV: 91.3 fL (ref 80.0–100.0)
Platelets: 124 10*3/uL — ABNORMAL LOW (ref 150–400)
RBC: 3.9 MIL/uL — ABNORMAL LOW (ref 4.22–5.81)
RDW: 15.8 % — ABNORMAL HIGH (ref 11.5–15.5)
WBC: 6.6 10*3/uL (ref 4.0–10.5)
nRBC: 0 % (ref 0.0–0.2)

## 2023-04-04 MED ORDER — SMOG ENEMA: 960.0000 mL | Freq: Once | RECTAL | Status: DC | PRN

## 2023-04-04 MED ORDER — SMOG ENEMA: 960.0000 mL | Freq: Once | RECTAL | Status: DC

## 2023-04-04 MED ORDER — LACTULOSE 10 GM/15ML PO SOLN
20.0000 g | Freq: Three times a day (TID) | ORAL | Status: AC
  Administered 2023-04-04 – 2023-04-05 (×3): 20 g via ORAL
  Filled 2023-04-04 (×5): qty 30

## 2023-04-04 MED ORDER — SENNA 8.6 MG PO TABS
1.0000 | ORAL_TABLET | Freq: Every day | ORAL | Status: DC
  Administered 2023-04-04 – 2023-04-06 (×3): 8.6 mg via ORAL
  Filled 2023-04-04 (×3): qty 1

## 2023-04-04 NOTE — Plan of Care (Signed)

## 2023-04-04 NOTE — Progress Notes (Signed)
PROGRESS NOTE  Brian Lawson    DOB: [birth year], 68 y.o.  VOJ:[id number]    Code Status: Full Code   DOA: 04/02/2023   LOS: 0   Brief hospital course  Brian Lawson is a 68 y.o. male with history of chronic kidney disease stage IV with single functioning kidney with prior history of nephrectomy for renal cell carcinoma, history of gout, history of hypertension and history of chronic constipation being followed by gastroenterologist presents to the ER because of increasing rectal pain and constipation.    ED Course: In the ER patient had to be manually disimpacted and also was given bisacodyl following which patient had large bowel movement.  CT scan shows proctitis either inflammatory or infectious.  Patient's creatinine which is usually around 2.8-3 has increased to 3.5.  Was given fluid and antibiotics admitted for further observation.  04/04/23 -stable clinically, renal function worsened today since admission  Assessment & Plan  Principal Problem:   Proctitis Active Problems:   Essential hypertension   H/O renal cell cancer   Renal failure (ARF), acute on chronic (HCC)   History of gout   Thrombocytopenia (HCC)   Anemia  Proctitis with history of severe constipation -    Per CAT scan reading patient's proctitis could be either inflammatory or infectious.  Suspect likely from chronic constipation.  Patient has had a colonoscopy in September 2023 by Dr. Rhea Belton, which showed moderate diverticulosis and had polyps removed.   - Cipro and Flagyl  - continue lactulose - enema PRN   Acute on chronic kidney disease stage IV with single functioning kidney prior history of nephrectomy for renal cell carcinoma.  - continue hydration as Cr has worsened today. He has good UOP and no dysuria. If worsens again tomorrow, will contact nephrology. Medications reviewed for contribution.   Gout on allopurinol and takes 0.3 mg of colchicine.  Doses of which may need to be adjusted if there is worsening  of renal function. Hypertension on clonidine and hydralazine. Hyperlipidemia on statins. 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  Follow CBC. Thrombocytopenia appears to be new.  Follow CBC.  There is no height or weight on file to calculate BMI.  VTE ppx: heparin injection 5,000 Units Start: 04/03/23 1400  Diet:     Diet   Diet renal with fluid restriction Fluid restriction: 1200 mL Fluid; Room service appropriate? Yes; Fluid consistency: Thin   Consultants: None   Subjective 04/04/23    Pt reports doing well today. Denies abdominal pain. Has not had another BM today. Endorses good UOP without dysuria.    Objective   Vitals:   04/03/23 1530 04/03/23 2133 04/04/23 0502 04/04/23 0718  BP: (!) 149/71 (!) 141/76 127/77 139/77  Pulse: 90 70 68 75  Resp: 18 18 18 20   Temp: 98 F (36.7 C) 98.3 F (36.8 C) 98.1 F (36.7 C) 98.3 F (36.8 C)  TempSrc: Oral Oral Oral Oral  SpO2: 98% 100% 100% 100%    Intake/Output Summary (Last 24 hours) at 04/04/2023 0848 Last data filed at 04/04/2023 9937 Gross per 24 hour  Intake 2013.82 ml  Output 1200 ml  Net 813.82 ml   There were no vitals filed for this visit.   Physical Exam:  General: awake, alert, NAD HEENT: atraumatic, clear conjunctiva, anicteric sclera, MMM, hearing grossly normal Respiratory: normal respiratory effort. Cardiovascular: quick capillary refill, normal S1/S2, RRR, no JVD, murmurs Gastrointestinal: soft, NT, ND Nervous: A&O x3. no gross focal neurologic deficits, normal speech Extremities: moves all equally,  no edema, normal tone Skin: dry, intact, normal temperature, normal color. No rashes, lesions or ulcers on exposed skin Psychiatry: normal mood, congruent affect  Labs   I have personally reviewed the following labs and imaging studies CBC    Component Value Date/Time   WBC 6.6 04/04/2023 0522   RBC 3.90 (L) 04/04/2023 0522   HGB 11.1 (L) 04/04/2023 0522   HCT 35.6 (L) 04/04/2023 0522   PLT 124 (L)  04/04/2023 0522   MCV 91.3 04/04/2023 0522   MCH 28.5 04/04/2023 0522   MCHC 31.2 04/04/2023 0522   RDW 15.8 (H) 04/04/2023 0522   LYMPHSABS 1.3 04/02/2023 1841   MONOABS 0.6 04/02/2023 1841   EOSABS 0.1 04/02/2023 1841   BASOSABS 0.0 04/02/2023 1841      Latest Ref Rng & Units 04/04/2023    5:22 AM 04/03/2023    6:45 AM 04/02/2023    6:41 PM  BMP  Glucose 70 - 99 mg/dL 829  94  562   BUN 8 - 23 mg/dL 37  27  29   Creatinine 0.61 - 1.24 mg/dL 1.30  8.65  7.84   Sodium 135 - 145 mmol/L 139  136  140   Potassium 3.5 - 5.1 mmol/L 5.1  3.9  4.4   Chloride 98 - 111 mmol/L 112  112  109   CO2 22 - 32 mmol/L 20  18  21    Calcium 8.9 - 10.3 mg/dL 8.3  6.9  9.0     CT ABDOMEN PELVIS WO CONTRAST Result Date: 04/03/2023 CLINICAL DATA:  Acute nonlocalized abdominal pain EXAM: CT ABDOMEN AND PELVIS WITHOUT CONTRAST TECHNIQUE: Multidetector CT imaging of the abdomen and pelvis was performed following the standard protocol without IV contrast. RADIATION DOSE REDUCTION: This exam was performed according to the departmental dose-optimization program which includes automated exposure control, adjustment of the mA and/or kV according to patient size and/or use of iterative reconstruction technique. COMPARISON:  03/11/2020 FINDINGS: Lower chest: No acute abnormality. Hepatobiliary: No focal liver abnormality is seen. No gallstones, gallbladder wall thickening, or biliary dilatation. Pancreas: Unremarkable Spleen: Unremarkable Adrenals/Urinary Tract: Status post left radical nephrectomy. The right adrenal gland and kidney are unremarkable save for 2 separate 3 mm nonobstructing calculi within the interpolar region. Bladder is unremarkable. Stomach/Bowel: There is circumferential wall thickening involving the terminal rectum and mild perirectal inflammatory stranding suggesting changes of an infectious or inflammatory proctitis. The stomach, small bowel, large bowel are otherwise unremarkable. Appendix normal. No  free intraperitoneal gas or fluid. Vascular/Lymphatic: Moderate aortoiliac atherosclerotic calcification. No pathologic adenopathy within the abdomen and pelvis. Reproductive: Prostate is unremarkable. Other: Tiny fat containing umbilical hernia. Stable 15 mm subcutaneous soft tissue nodule left anterior mid abdominal wall, safely considered benign Musculoskeletal: Degenerative changes are seen within the lumbar spine. No acute bone abnormality. No lytic or blastic bone lesion. IMPRESSION: 1. Circumferential wall thickening involving the terminal rectum and mild perirectal inflammatory stranding suggesting changes of an infectious or inflammatory proctitis. 2. Status post left radical nephrectomy. 3. Nonobstructing right nephrolithiasis. 4. Moderate aortoiliac atherosclerotic calcification. Aortic Atherosclerosis (ICD10-I70.0). Electronically Signed   By: Helyn Numbers M.D.   On: 04/03/2023 01:13    Disposition Plan & Communication  Patient status: Observation  Admitted From: Home Planned disposition location: Home Anticipated discharge date: 1/26 pending renal function improvement  Family Communication: none at bedside    Author: Leeroy Bock, DO [facility] 04/04/2023, 8:48 AM   Available by Epic secure chat 7AM-7PM. If 7PM-7AM, please contact  night-coverage.  [facility] contact information found on [email or web address].

## 2023-04-04 NOTE — TOC CM/SW Note (Signed)
Transition of Care [facility]) - Inpatient Brief Assessment   Patient Details  Name: Brian Lawson MRN: [id number] Date of Birth: 1955/08/17  [facility] CM/SW Contact:    Harriet Masson, RN Phone Number: 04/04/2023, 2:00 PM   Clinical Narrative:   Spoke to patient regarding transition needs.  Patient lives alone and is independent.  Patient has family/friends that can transport home at discharge.  No TOC needs at this time.  Transition of Care Asessment: Insurance and Status: Insurance coverage has been reviewed Patient has primary care physician: Yes Home environment has been reviewed: safe to discharge home when medically stable Prior level of function:: independent Prior/Current Home Services: No current home services Social Drivers of Health Review: SDOH reviewed no interventions necessary Readmission risk has been reviewed: Yes Transition of care needs: no transition of care needs at this time

## 2023-04-04 NOTE — TOC Initial Note (Signed)
Transition of Care [facility]) - Initial/Assessment Note    Patient Details  Name: Brian Lawson MRN: [id number] Date of Birth: May 06, 1955  Transition of [facility]) CM/SW Contact:    Marliss Coots, LCSW Phone Number: 04/04/2023, 10:10 AM  Clinical Narrative:                  10:10 AM Per progressions, patient may discharge home today. Patient's stomach was disimpacted in the ED and remains observation for constipation and abdominal pain.  Expected Discharge Plan: Home/Self Care Barriers to Discharge: Continued Medical Work up   Patient Goals and CMS Choice Patient states their goals for this hospitalization and ongoing recovery are:: Home          Expected Discharge Plan and Services       Living arrangements for the past 2 months: Single Family Home                                      Prior Living Arrangements/Services Living arrangements for the past 2 months: Single Family Home Lives with:: Self Patient language and need for interpreter reviewed:: Yes        Need for Family Participation in Patient Care: No (Comment) Care giver support system in place?: No (comment)   Criminal Activity/Legal Involvement Pertinent to Current Situation/Hospitalization: No - Comment as needed  Activities of Daily Living   ADL Screening (condition at time of admission) Independently performs ADLs?: Yes (appropriate for developmental age) Is the patient deaf or have difficulty hearing?: No Does the patient have difficulty seeing, even when wearing glasses/contacts?: No Does the patient have difficulty concentrating, remembering, or making decisions?: No  Permission Sought/Granted Permission sought to share information with : Family Supports Permission granted to share information with : No (Contact information on chart)  Share Information with NAME: Bo Merino     Permission granted to share info w Relationship: Mother  Permission granted to share info w Contact  Information: [phone]  Emotional Assessment       Orientation: : Oriented to Self, Oriented to Place, Oriented to  Time, Oriented to Situation Alcohol / Substance Use: Not Applicable Psych Involvement: No (comment)  Admission diagnosis:  Proctitis [K62.89] Thrombocytopenia (HCC) [D69.6] Normochromic normocytic anemia [D64.9] 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] Patient Active Problem List   Diagnosis Date Noted   Proctitis 04/03/2023   Renal failure (ARF), acute on chronic (HCC) 04/03/2023   History of gout 04/03/2023   Thrombocytopenia (HCC) 04/03/2023   Anemia 04/03/2023   Acute post-hemorrhagic anemia    BRBPR (bright red blood per rectum) 09/21/2014   Lower GI bleed 09/21/2014   Uremia 12/29/2013   Hyponatremia 12/29/2013   CKD (chronic kidney disease) stage 4, GFR 15-29 ml/min (HCC) 06/24/2013   H/O renal cell cancer 05/28/2013   Legionella pneumonia (HCC) 01/02/2013   GOUT 12/15/2006   Essential hypertension 12/15/2006   Osteoarthritis 12/15/2006   HEPATITIS B, HX OF 12/15/2006   PCP:  Charlane Ferretti, DO Pharmacy:   [id number] [place], [place] - [address] AT [facility] OF [address] [place] [address] Phone: [phone] Fax: [phone]  [id number] - [place] - [address] [place] ([place]) [place] [address] Phone: [phone] Fax: [phone]     Social Drivers of Health (SDOH) Social History: SDOH Screenings   Food Insecurity: No  Food Insecurity (04/03/2023)  Housing: Low Risk  (04/03/2023)  Transportation Needs: No Transportation Needs (04/03/2023)  Utilities: Not At Risk (04/03/2023)  Social Connections: Socially Isolated (04/03/2023)  Tobacco Use: Medium Risk (04/03/2023)   SDOH Interventions:     Readmission Risk Interventions     No data to display

## 2023-04-04 NOTE — Care Management Obs Status (Signed)
MEDICARE OBSERVATION STATUS NOTIFICATION   Patient Details  Name: Brian Lawson MRN: [id number] Date of Birth: 1955-05-19   Medicare Observation Status Notification Given:  Yes    Harriet Masson, RN 04/04/2023, 12:01 PM

## 2023-04-05 DIAGNOSIS — K6289 Other specified diseases of anus and rectum: Secondary | ICD-10-CM | POA: Diagnosis not present

## 2023-04-05 LAB — BASIC METABOLIC PANEL
Anion gap: 9 (ref 5–15)
BUN: 39 mg/dL — ABNORMAL HIGH (ref 8–23)
CO2: 20 mmol/L — ABNORMAL LOW (ref 22–32)
Calcium: 8.5 mg/dL — ABNORMAL LOW (ref 8.9–10.3)
Chloride: 110 mmol/L (ref 98–111)
Creatinine, Ser: 3.7 mg/dL — ABNORMAL HIGH (ref 0.61–1.24)
GFR, Estimated: 17 mL/min — ABNORMAL LOW (ref >60)
Glucose, Bld: 119 mg/dL — ABNORMAL HIGH (ref 70–99)
Potassium: 4.3 mmol/L (ref 3.5–5.1)
Sodium: 139 mmol/L (ref 135–145)

## 2023-04-05 MED ORDER — ALLOPURINOL 100 MG PO TABS: 50.0000 mg | ORAL_TABLET | ORAL | Status: DC

## 2023-04-05 MED ORDER — LACTATED RINGERS IV SOLN: INTRAVENOUS | Status: AC

## 2023-04-05 NOTE — Plan of Care (Signed)

## 2023-04-05 NOTE — Progress Notes (Signed)
Progress Note    Brian Lawson  NWG:[id number] DOB: 02/22/1956  DOA: 04/02/2023 PCP: Charlane Ferretti, DO      Brief Narrative:    Medical records reviewed and are as summarized below:  Brian Lawson is a 68 y.o. 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  ED Course: In the ER patient had to be manually disimpacted and also was given bisacodyl following which patient had large bowel movement.  CT scan shows proctitis either inflammatory or infectious.  Patient's creatinine which is usually around 2.8-3 has increased to 3.86        Assessment/Plan:   Principal Problem:   Proctitis Active Problems:   Essential hypertension   H/O renal cell cancer   Renal failure (ARF), acute on chronic (HCC)   History of gout   Thrombocytopenia (HCC)   Anemia    Body mass index is 24.5 kg/m.   Proctitis with history of severe constipation -    Per CAT scan reading patient's proctitis could be either inflammatory or infectious.  Suspect likely from chronic constipation.  Patient has had a colonoscopy in September 2023 by Dr. Rhea Belton, which showed moderate diverticulosis and had polyps removed.   -Continue ciprofloxacin and Flagyl.  Continue lactulose.     Acute on chronic kidney disease stage IV with single functioning  kidney prior history of nephrectomy for renal cell carcinoma.       Creatinine only down from 3.86-3.7.  Creatinine on admission was  3.59 went down to 2.94.  Restart IV fluids and repeat BMP tomorrow   Gout: Continue allopurinol and colchicine  Hypertension on clonidine and hydralazine.  Hyperlipidemia on statins.  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  Follow CBC.  Thrombocytopenia  appears to be new.  Follow CBC.       Diet Order             Diet renal with fluid restriction Fluid restriction: 1200 mL Fluid; Room service appropriate? Yes; Fluid consistency: Thin  Diet effective now                            Consultants: None  Procedures: None    Medications:    allopurinol  150 mg Oral Daily   cloNIDine  0.2 mg Oral TID   heparin  5,000 Units Subcutaneous Q8H   hydrALAZINE  50 mg Oral TID   lactulose  20 g Oral TID   linaclotide  290 mcg Oral QAC breakfast   multivitamin  1 tablet Oral Daily   rosuvastatin  10 mg Oral QHS   senna  1 tablet Oral Daily   Continuous Infusions:  ciprofloxacin Stopped (04/04/23 2217)   metronidazole Stopped (04/04/23 2316)     Anti-infectives (From admission, onward)    Start     Dose/Rate Route Frequency Ordered Stop   04/03/23 2200  ciprofloxacin (CIPRO) IVPB 400 mg        400 mg 200 mL/hr over 60 Minutes Intravenous Every 24 hours 04/03/23 0540     04/03/23 1000  metroNIDAZOLE (FLAGYL) IVPB 500 mg        500 mg 100 mL/hr over 60 Minutes Intravenous Every 12 hours 04/03/23 0532 04/08/23 0959   04/03/23  0315  ciprofloxacin (CIPRO) IVPB 400 mg        400 mg 200 mL/hr over 60 Minutes Intravenous  Once 04/03/23 0312 04/03/23 0431   04/03/23 0315  metroNIDAZOLE (FLAGYL) IVPB 500 mg        500 mg 100 mL/hr over 60 Minutes Intravenous  Once 04/03/23 6578 04/03/23 0548              Family Communication/Anticipated D/C date and plan/Code Status   DVT prophylaxis: heparin injection 5,000 Units Start: 04/03/23 1400     Code Status: Full Code  Family Communication: None Disposition Plan: Plan to discharge home tomorrow   Status is: Observation The patient will require care spanning > 2 midnights and should be moved to inpatient because: Creatinine is not at baseline.  Continue IV fluids       Subjective:   Interval events noted.  No abdominal pain, shortness of breath, chest  pain.  He said he had a bowel movement last night.  Urine output is okay.  Objective:    Vitals:   04/04/23 1531 04/04/23 2119 04/05/23 0331 04/05/23 0836  BP: (!) 146/81 (!) 166/80 (!) 145/82 (!) 166/93  Pulse: 74 69 71 72  Resp: 20 18 15 16   Temp: 98.1 F (36.7 C) 98 F (36.7 C) 97.9 F (36.6 C) 98 F (36.7 C)  TempSrc: Oral Oral Oral Oral  SpO2: 100% 100% 100% 100%  Weight: 88.9 kg      No data found.   Intake/Output Summary (Last 24 hours) at 04/05/2023 0953 Last data filed at 04/05/2023 0404 Gross per 24 hour  Intake 640 ml  Output 46962 ml  Net -78115 ml   Filed Weights   04/04/23 1531  Weight: 88.9 kg    Exam:   GEN: NAD SKIN: Warm and dry EYES: No pallor or icterus ENT: MMM CV: RRR PULM: CTA B ABD: soft, ND, NT, +BS CNS: AAO x 3, non focal EXT: No edema or tenderness       Data Reviewed:   I have personally reviewed following labs and imaging studies:  Labs: Labs show the following:   Basic Metabolic Panel: Recent Labs  Lab 04/02/23 1841 04/03/23 0645 04/04/23 0522 04/05/23 0411  NA 140 136 139 139  K 4.4 3.9 5.1 4.3  CL 109 112* 112* 110  CO2 21* 18* 20* 20*  GLUCOSE 138* 94 102* 119*  BUN 29* 27* 37* 39*  CREATININE 3.59* 2.94* 3.86* 3.70*  CALCIUM 9.0 6.9* 8.3* 8.5*   GFR Estimated Creatinine Clearance: 22.8 mL/min (A) (by C-G formula based on SCr of 3.7 mg/dL (H)). Liver Function Tests: Recent Labs  Lab 04/02/23 1841 04/03/23 0645  AST 34 40  ALT 27 23  ALKPHOS 41 30*  BILITOT 0.6 0.7  PROT 7.1 5.1*  ALBUMIN 3.6 2.6*   Recent Labs  Lab 04/02/23 1841  LIPASE 32   No results for input(s): "AMMONIA" in the last 168 hours. Coagulation profile No results for input(s): "INR", "PROTIME" in the last 168 hours.  CBC: Recent Labs  Lab 04/02/23 1841 04/04/23 0522  WBC 7.0 6.6  NEUTROABS 5.0  --   HGB 11.9* 11.1*  HCT 38.3* 35.6*  MCV 92.5 91.3  PLT 132* 124*   Cardiac Enzymes: No results for input(s):  "CKTOTAL", "CKMB", "CKMBINDEX", "TROPONINI" in the last 168 hours. BNP (last 3 results) No results for input(s): "PROBNP" in the last 8760 hours. CBG: No results for input(s): "GLUCAP" in the last 168 hours. D-Dimer:  No results for input(s): "DDIMER" in the last 72 hours. Hgb A1c: No results for input(s): "HGBA1C" in the last 72 hours. Lipid Profile: No results for input(s): "CHOL", "HDL", "LDLCALC", "TRIG", "CHOLHDL", "LDLDIRECT" in the last 72 hours. Thyroid function studies: Recent Labs    04/03/23 0536  TSH 0.751   Anemia work up: No results for input(s): "VITAMINB12", "FOLATE", "FERRITIN", "TIBC", "IRON", "RETICCTPCT" in the last 72 hours. Sepsis Labs: Recent Labs  Lab 04/02/23 1841 04/04/23 0522  WBC 7.0 6.6    Microbiology No results found for this or any previous visit (from the past 240 hours).  Procedures and diagnostic studies:  No results found.             LOS: 0 days   Brian Lawson  [facility]   Pager on [email or web address]. If 7PM-7AM, please contact night-coverage at [email or web address]     04/05/2023, 9:53 AM

## 2023-04-05 NOTE — Plan of Care (Signed)

## 2023-04-06 DIAGNOSIS — K6289 Other specified diseases of anus and rectum: Secondary | ICD-10-CM | POA: Diagnosis not present

## 2023-04-06 LAB — CBC
HCT: 34.1 % — ABNORMAL LOW (ref 39.0–52.0)
Hemoglobin: 10.7 g/dL — ABNORMAL LOW (ref 13.0–17.0)
MCH: 28.5 pg (ref 26.0–34.0)
MCHC: 31.4 g/dL (ref 30.0–36.0)
MCV: 90.9 fL (ref 80.0–100.0)
Platelets: 123 10*3/uL — ABNORMAL LOW (ref 150–400)
RBC: 3.75 MIL/uL — ABNORMAL LOW (ref 4.22–5.81)
RDW: 15.7 % — ABNORMAL HIGH (ref 11.5–15.5)
WBC: 5.2 10*3/uL (ref 4.0–10.5)
nRBC: 0 % (ref 0.0–0.2)

## 2023-04-06 LAB — BASIC METABOLIC PANEL
Anion gap: 7 (ref 5–15)
BUN: 39 mg/dL — ABNORMAL HIGH (ref 8–23)
CO2: 20 mmol/L — ABNORMAL LOW (ref 22–32)
Calcium: 8.6 mg/dL — ABNORMAL LOW (ref 8.9–10.3)
Chloride: 112 mmol/L — ABNORMAL HIGH (ref 98–111)
Creatinine, Ser: 3.76 mg/dL — ABNORMAL HIGH (ref 0.61–1.24)
GFR, Estimated: 17 mL/min — ABNORMAL LOW (ref >60)
Glucose, Bld: 114 mg/dL — ABNORMAL HIGH (ref 70–99)
Potassium: 4.4 mmol/L (ref 3.5–5.1)
Sodium: 139 mmol/L (ref 135–145)

## 2023-04-06 MED ORDER — METRONIDAZOLE 500 MG PO TABS: 500.0000 mg | ORAL_TABLET | Freq: Three times a day (TID) | ORAL | 0 refills | Status: AC

## 2023-04-06 NOTE — Discharge Summary (Signed)
Physician Discharge Summary   Patient: Brian Lawson MRN: [id number] DOB: [birth year]  Admit date:     04/02/2023  Discharge date: 04/06/23  Discharge Physician: Lurene Shadow   PCP: Charlane Ferretti, DO   Recommendations at discharge:   Follow-up with PCP or nephrologist within 1 week of discharge  Discharge Diagnoses: Principal Problem:   Proctitis Active Problems:   Essential hypertension   H/O renal cell cancer   Renal failure (ARF), acute on chronic (HCC)   History of gout   Thrombocytopenia (HCC)   Anemia  Resolved Problems:   * No resolved hospital problems. [facility] Course:  Brian Lawson is a 68 y.o. 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   ED Course: In the ER patient had to be manually disimpacted and also was given bisacodyl following which patient had large bowel movement.  CT scan shows proctitis either inflammatory or infectious.  Patient's creatinine which is usually around 2.8-3 has increased to 3.86    Assessment and Plan:   Proctitis with history of severe constipation -    Per CAT scan reading patient's proctitis could be either inflammatory or infectious.  Suspect likely from chronic constipation.  Patient has had a colonoscopy in September 2023 by Dr. Rhea Belton, which showed moderate diverticulosis and had polyps removed.   He was treated with IV metronidazole and IV ciprofloxacin.  He will be discharged on oral Flagyl.     Acute on chronic kidney disease stage IV with single functioning  kidney prior history of nephrectomy for renal cell carcinoma.       Creatinine is stable at 3.76.  He is still within CKD stage IV.    Recommended outpatient follow-up with nephrologist to  monitor kidney function.   Gout: Continue allopurinol and colchicine   Hypertension on clonidine and hydralazine.   Hyperlipidemia on statins.   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  H&H is stable.   Thrombocytopenia platelet count is stable.  Outpatient follow-up with PCP   His condition has improved and he is deemed stable for discharge to home today.      Consultants: None Procedures performed: None  Disposition: Home Diet recommendation:  Discharge Diet Orders (From admission, onward)     Start     Ordered   04/06/23 0000  Diet - low sodium heart healthy        04/06/23 0930           Cardiac diet DISCHARGE MEDICATION: Allergies as of 04/06/2023   No Known Allergies      Medication List     STOP taking these medications    aspirin EC 81 MG tablet       TAKE these medications    allopurinol 300 MG tablet Commonly known as: ZYLOPRIM Take 150 mg by mouth daily.   cholecalciferol 1000 units tablet Commonly known as: VITAMIN D Take 1,000 Units by mouth daily.   cloNIDine 0.2 MG tablet Commonly known as: CATAPRES Take 0.2 mg by mouth 3 (three) times daily.   colchicine 0.6 MG tablet Take 0.3 mg by mouth daily.   hydrALAZINE 50 MG tablet Commonly known as: APRESOLINE Take 50 mg by mouth 3 (three) times daily.   lactulose 10 GM/15ML solution Commonly known as: CHRONULAC Take  15 g by mouth 3 (three) times a week.   metroNIDAZOLE 500 MG tablet Commonly known as: Flagyl Take 1 tablet (500 mg total) by mouth 3 (three) times daily for 3 doses.   multivitamin Tabs tablet Take 1 tablet by mouth daily.   Probiotic (Lactobacillus) Caps Take 1 capsule by mouth daily.   rosuvastatin 10 MG tablet Commonly known as: CRESTOR Take 10 mg by mouth at bedtime.        Discharge Exam: Filed Weights   04/04/23 1531 04/06/23 1610  Weight: 88.9 kg 92.7 kg   GEN: NAD SKIN: Warm and dry EYES: No pallor or icterus ENT: MMM CV: RRR PULM: CTA B ABD:  soft, ND, NT, +BS CNS: AAO x 3, non focal EXT: No edema or tenderness   Condition at discharge: good  The results of significant diagnostics from this hospitalization (including imaging, microbiology, ancillary and laboratory) are listed below for reference.   Imaging Studies: CT ABDOMEN PELVIS WO CONTRAST Result Date: 04/03/2023 CLINICAL DATA:  Acute nonlocalized abdominal pain EXAM: CT ABDOMEN AND PELVIS WITHOUT CONTRAST TECHNIQUE: Multidetector CT imaging of the abdomen and pelvis was performed following the standard protocol without IV contrast. RADIATION DOSE REDUCTION: This exam was performed according to the departmental dose-optimization program which includes automated exposure control, adjustment of the mA and/or kV according to patient size and/or use of iterative reconstruction technique. COMPARISON:  03/11/2020 FINDINGS: Lower chest: No acute abnormality. Hepatobiliary: No focal liver abnormality is seen. No gallstones, gallbladder wall thickening, or biliary dilatation. Pancreas: Unremarkable Spleen: Unremarkable Adrenals/Urinary Tract: Status post left radical nephrectomy. The right adrenal gland and kidney are unremarkable save for 2 separate 3 mm nonobstructing calculi within the interpolar region. Bladder is unremarkable. Stomach/Bowel: There is circumferential wall thickening involving the terminal rectum and mild perirectal inflammatory stranding suggesting changes of an infectious or inflammatory proctitis. The stomach, small bowel, large bowel are otherwise unremarkable. Appendix normal. No free intraperitoneal gas or fluid. Vascular/Lymphatic: Moderate aortoiliac atherosclerotic calcification. No pathologic adenopathy within the abdomen and pelvis. Reproductive: Prostate is unremarkable. Other: Tiny fat containing umbilical hernia. Stable 15 mm subcutaneous soft tissue nodule left anterior mid abdominal wall, safely considered benign Musculoskeletal: Degenerative changes are seen  within the lumbar spine. No acute bone abnormality. No lytic or blastic bone lesion. IMPRESSION: 1. Circumferential wall thickening involving the terminal rectum and mild perirectal inflammatory stranding suggesting changes of an infectious or inflammatory proctitis. 2. Status post left radical nephrectomy. 3. Nonobstructing right nephrolithiasis. 4. Moderate aortoiliac atherosclerotic calcification. Aortic Atherosclerosis (ICD10-I70.0). Electronically Signed   By: Helyn Numbers M.D.   On: 04/03/2023 01:13   ECHOCARDIOGRAM COMPLETE Result Date: 03/27/2023    ECHOCARDIOGRAM REPORT   Patient Name:   Brian Lawson Date of Exam: 03/27/2023 Medical Rec #:  [id number]     Height:       75.0 in Accession #:    [id number]    Weight:       196.0 lb Date of Birth:  1956-02-10     BSA:          2.176 m Patient Age:    67 years      BP:           129/88 mmHg Patient Gender: M             HR:           90 bpm. Exam Location:  [address] Procedure: 2D Echo, 3D Echo, Cardiac Doppler and Color Doppler Indications:  R07.2 Chest Pain  History:        Patient has no prior history of Echocardiogram examinations.                 CKD; Risk Factors:Hypertension.  Sonographer:    Clearence Ped RCS Referring Phys: [id number] Orbie Pyo IMPRESSIONS  1. Left ventricular ejection fraction, by estimation, is 50%. The left ventricle has low normal function. The left ventricle has no regional wall motion abnormalities. Left ventricular diastolic parameters are indeterminate. There is pseudodyskinesis of  the inferolateral wall.  2. Right ventricular systolic function is normal. The right ventricular size is normal. There is normal pulmonary artery systolic pressure. The estimated right ventricular systolic pressure is 30.0 mmHg.  3. The mitral valve is normal in structure. No evidence of mitral valve regurgitation. No evidence of mitral stenosis.  4. The aortic valve is tricuspid. Aortic valve regurgitation is trivial. No aortic stenosis  is present.  5. The inferior vena cava is normal in size with greater than 50% respiratory variability, suggesting right atrial pressure of 3 mmHg. FINDINGS  Left Ventricle: Left ventricular ejection fraction, by estimation, is 50%. The left ventricle has low normal function. The left ventricle has no regional wall motion abnormalities. 3D ejection fraction reviewed and evaluated as part of the interpretation. Alternate measurement of EF is felt to be most reflective of LV function. The left ventricular internal cavity size was normal in size. There is no left ventricular hypertrophy. Pseudodyskinesis of the inferolateral wall. Left ventricular  diastolic parameters are indeterminate. Right Ventricle: The right ventricular size is normal. No increase in right ventricular wall thickness. Right ventricular systolic function is normal. There is normal pulmonary artery systolic pressure. The tricuspid regurgitant velocity is 2.60 m/s, and  with an assumed right atrial pressure of 3 mmHg, the estimated right ventricular systolic pressure is 30.0 mmHg. Left Atrium: Left atrial size was normal in size. Right Atrium: Right atrial size was normal in size. Pericardium: There is no evidence of pericardial effusion. Mitral Valve: The mitral valve is normal in structure. No evidence of mitral valve regurgitation. No evidence of mitral valve stenosis. Tricuspid Valve: The tricuspid valve is normal in structure. Tricuspid valve regurgitation is trivial. No evidence of tricuspid stenosis. Aortic Valve: The aortic valve is tricuspid. Aortic valve regurgitation is trivial. No aortic stenosis is present. Pulmonic Valve: The pulmonic valve was grossly normal. Pulmonic valve regurgitation is mild to moderate. No evidence of pulmonic stenosis. Aorta: The aortic root is normal in size and structure. Venous: The inferior vena cava is normal in size with greater than 50% respiratory variability, suggesting right atrial pressure of 3 mmHg.  IAS/Shunts: No atrial level shunt detected by color flow Doppler.  LEFT VENTRICLE PLAX 2D LVIDd:         4.40 cm   Diastology LVIDs:         3.50 cm   LV e' medial:    4.03 cm/s LV PW:         0.90 cm   LV E/e' medial:  15.2 LV IVS:        0.90 cm   LV e' lateral:   7.94 cm/s LVOT diam:     2.20 cm   LV E/e' lateral: 7.7 LV SV:         54 LV SV Index:   25 LVOT Area:     3.80 cm  3D Volume EF:                          3D EF:        42 %                          LV EDV:       134 ml                          LV ESV:       78 ml                          LV SV:        56 ml RIGHT VENTRICLE RV Basal diam:  3.90 cm RV S prime:     9.79 cm/s TAPSE (M-mode): 2.1 cm RVSP:           30.0 mmHg LEFT ATRIUM             Index        RIGHT ATRIUM           Index LA diam:        3.70 cm 1.70 cm/m   RA Pressure: 3.00 mmHg LA Vol (A2C):   40.7 ml 18.71 ml/m  RA Area:     11.00 cm LA Vol (A4C):   33.1 ml 15.21 ml/m  RA Volume:   26.20 ml  12.04 ml/m LA Biplane Vol: 38.7 ml 17.79 ml/m  AORTIC VALVE LVOT Vmax:   77.60 cm/s LVOT Vmean:  51.900 cm/s LVOT VTI:    0.142 m  AORTA Ao Root diam: 3.50 cm Ao Asc diam:  3.10 cm MITRAL VALVE                TRICUSPID VALVE MV Area (PHT):              TR Peak grad:   27.0 mmHg MV Decel Time:              TR Vmax:        260.00 cm/s MV E velocity: 61.30 cm/s   Estimated RAP:  3.00 mmHg MV A velocity: 108.00 cm/s  RVSP:           30.0 mmHg MV E/A ratio:  0.57                             SHUNTS                             Systemic VTI:  0.14 m                             Systemic Diam: 2.20 cm Weston Brass MD Electronically signed by Weston Brass MD Signature Date/Time: 03/27/2023/10:35:04 PM    Final     Microbiology: Results for orders placed or performed during the hospital encounter of 10/10/21  Culture, group A strep (throat)     Status: None   Collection Time: 10/10/21  2:49 PM   Specimen: Throat  Result Value Ref Range Status   Specimen Description  THROAT  Final   Special Requests NONE  Final   Culture   Final    NO GROUP  A STREP (S.PYOGENES) ISOLATED Performed at [facility], [address] [place], [place] [address]    Report Status 10/13/2021 FINAL  Final    Labs: CBC: Recent Labs  Lab 04/02/23 1841 04/04/23 0522 04/06/23 0404  WBC 7.0 6.6 5.2  NEUTROABS 5.0  --   --   HGB 11.9* 11.1* 10.7*  HCT 38.3* 35.6* 34.1*  MCV 92.5 91.3 90.9  PLT 132* 124* 123*   Basic Metabolic Panel: Recent Labs  Lab 04/02/23 1841 04/03/23 0645 04/04/23 0522 04/05/23 0411 04/06/23 0404  NA 140 136 139 139 139  K 4.4 3.9 5.1 4.3 4.4  CL 109 112* 112* 110 112*  CO2 21* 18* 20* 20* 20*  GLUCOSE 138* 94 102* 119* 114*  BUN 29* 27* 37* 39* 39*  CREATININE 3.59* 2.94* 3.86* 3.70* 3.76*  CALCIUM 9.0 6.9* 8.3* 8.5* 8.6*   Liver Function Tests: Recent Labs  Lab 04/02/23 1841 04/03/23 0645  AST 34 40  ALT 27 23  ALKPHOS 41 30*  BILITOT 0.6 0.7  PROT 7.1 5.1*  ALBUMIN 3.6 2.6*   CBG: No results for input(s): "GLUCAP" in the last 168 hours.  Discharge time spent: greater than 30 minutes.  Signed: Lurene Shadow, MD [facility] 04/06/2023

## 2023-04-06 NOTE — Progress Notes (Signed)
Patient discharged.  Removed PIV.  Reviewed discharge instructions, medications and follow up appts with patient.  Answered questions.  Gave patient copy of discharge instructions.  Prescriptions were sent to patient's pharmacy.  No additional questions or concerns at this time.

## 2023-05-05 ENCOUNTER — Encounter (HOSPITAL_COMMUNITY): Payer: Self-pay

## 2023-05-07 ENCOUNTER — Encounter (HOSPITAL_COMMUNITY)
Admission: RE | Admit: 2023-05-07 | Discharge: 2023-05-07 | Disposition: A | Discharge status: Home / Self Care | Payer: Medicare (Managed Care) | Source: Ambulatory Visit | Attending: Internal Medicine | Admitting: Internal Medicine

## 2023-05-07 ENCOUNTER — Encounter (HOSPITAL_COMMUNITY): Payer: Self-pay

## 2023-05-07 DIAGNOSIS — R072 Precordial pain: Secondary | ICD-10-CM | POA: Insufficient documentation

## 2023-05-07 LAB — NM PET CT CARDIAC PERFUSION MULTI W/ABSOLUTE BLOODFLOW
MBFR: 1.55
Nuc Rest EF: 51 %
Nuc Stress EF: 57 %
Peak HR: 99 {beats}/min
Rest HR: 80 {beats}/min
Rest MBF: 1.27 ml/g/min
Rest Nuclear Isotope Dose: 24.4 mCi
ST Depression (mm): 0 mm
Stress MBF: 1.97 ml/g/min
Stress Nuclear Isotope Dose: 24.4 mCi
TID: 1.02

## 2023-05-07 MED ORDER — REGADENOSON 0.4 MG/5ML IV SOLN
INTRAVENOUS | Status: AC
  Filled 2023-05-07: qty 5

## 2023-05-07 MED ORDER — REGADENOSON 0.4 MG/5ML IV SOLN
0.4000 mg | Freq: Once | INTRAVENOUS | Status: AC
  Administered 2023-05-07: 0.4 mg via INTRAVENOUS

## 2023-05-07 MED ORDER — RUBIDIUM RB82 GENERATOR (RUBYFILL)
24.3600 | PACK | Freq: Once | INTRAVENOUS | Status: AC
Start: 2023-05-07 — End: 2023-05-07
  Administered 2023-05-07: 24.36 via INTRAVENOUS

## 2023-05-07 MED ORDER — RUBIDIUM RB82 GENERATOR (RUBYFILL)
24.3800 | PACK | Freq: Once | INTRAVENOUS | Status: AC
  Administered 2023-05-07: 24.38 via INTRAVENOUS

## 2023-05-08 ENCOUNTER — Encounter: Payer: Self-pay | Admitting: Internal Medicine

## 2023-07-29 ENCOUNTER — Ambulatory Visit: Payer: Medicare (Managed Care) | Admitting: Gastroenterology

## 2023-07-29 ENCOUNTER — Encounter: Payer: Self-pay | Admitting: Gastroenterology

## 2023-07-29 VITALS — BP 122/70 | HR 92 | Ht 75.0 in | Wt 211.0 lb

## 2023-07-29 DIAGNOSIS — N184 Chronic kidney disease, stage 4 (severe): Secondary | ICD-10-CM

## 2023-07-29 DIAGNOSIS — K5909 Other constipation: Secondary | ICD-10-CM

## 2023-07-29 DIAGNOSIS — K581 Irritable bowel syndrome with constipation: Secondary | ICD-10-CM

## 2023-07-29 DIAGNOSIS — K6289 Other specified diseases of anus and rectum: Secondary | ICD-10-CM

## 2023-07-29 MED ORDER — LUBIPROSTONE 24 MCG PO CAPS: 24.0000 ug | ORAL_CAPSULE | Freq: Two times a day (BID) | ORAL | 3 refills | Status: DC

## 2023-07-29 NOTE — Progress Notes (Signed)
 Chief Complaint: Chronic constipation Primary GI MD: Dr. Bridgett Camps  HPI: 68 year old male history of chronic constipation presents for hospital follow-up  09/23/2014 colonoscopy done for hematochezia with moderate diverticulosis in the ascending colon and at the hepatic flexure without active bleeding, though this is presumed to be the source.  Repeat recommended in 10 years.    03/11/2020 CT the abdomen pelvis without contrast showed relatively long segment of nonspecific colitis in the descending colon.  Also diffuse mild colonic diverticulosis.  Also diffuse large colonic stool volume suggesting constipation, left nephrectomy and coronary atherosclerosis.  At that time was treated with Augmentin  and probiotics.    08/02/2021 patient followed with PCP and discussed that he was still having constipation with a bowel movement maybe once or twice a week.  MiraLAX it made him nauseous and Dulcolax was ineffective.    10/2021: seen by Reginal Capra, PA for constipation and given samples of linzess  and set up for colonoscopy    03/2023: hospital admission for constipation and rectal pain requiring manual disimpaction and CT showing proctitis inflammatory/infectious given Cipro /Flagyl   Discussed the use of AI scribe software for clinical note transcription with the patient, who gave verbal consent to proceed.  History of Present Illness  He has been experiencing chronic constipation, which worsened in January, leading to a hospital visit where he underwent disimpaction and was found to have inflammation on a CT scan. He was treated with antibiotics at that time.  He continues to experience constipation and requires medication to have bowel movements. He is currently using lactulose , a stool softener, and occasionally Senokot to manage his symptoms. When he takes these medications, he can have four to five bowel movements in one day, but without them, he does not have any bowel movements.  He has  tried Miralax in the past without success, and Linzess  was not covered by his insurance. Amitiza  was prescribed but was not affordable due to high out-of-pocket costs. Lactulose , although effective, makes him feel unwell on the days he takes it.  His bowel movements are sometimes hard, requiring effort to pass, but not always difficult. He also has stage four kidney disease, which requires him to limit his water intake, further complicating his constipation management. No pain during the examination.     PREVIOUS GI WORKUP   Colonoscopy for constipation 11/2021 - One 6 mm polyp in the ascending colon, removed with a cold snare. Resected and retrieved.  - One 8 mm polyp in the proximal transverse colon, removed with a cold snare. Resected and retrieved.  - Moderate diverticulosis in the sigmoid colon, in the distal descending colon and in the ascending colon.  Past Medical History:  Diagnosis Date   Anemia associated with chronic renal failure    Arthritis    OA   Cancer (HCC) renal lt nephrectomy   Chronic kidney disease    ACUTE RENAL FAILURE 12/2012 STATUS POST RT ARM AV FISTULA - HAS NOT STARTED DIALYSIS; LEFT RENAL MASS   Gout    Hepatitis    30 years ago   Hypertension    Pneumonia    HOSPITALIZED 12/2012 LEGIONLLA PNEUMONIA    Past Surgical History:  Procedure Laterality Date   BASCILIC VEIN TRANSPOSITION Right 01/18/2013   Procedure: BASCILIC VEIN TRANSPOSITION;  Surgeon: Richrd Char, MD;  Location: [facility] OR;  Service: Vascular;  Laterality: Right;   BASCILIC VEIN TRANSPOSITION Right 04/27/2013   Procedure: BASCILIC VEIN TRANSPOSITION AND REVISION;  Surgeon: Richrd Char, MD;  Location: [facility] OR;  Service: Vascular;  Laterality: Right;   COLONOSCOPY N/A 09/23/2014   Procedure: COLONOSCOPY;  Surgeon: Nannette Babe, MD;  Location: [facility] ENDOSCOPY;  Service: Endoscopy;  Laterality: N/A;   HERNIA REPAIR     LAPAROSCOPIC NEPHRECTOMY Left 05/28/2013   Procedure: LEFT LAPAROSCOPIC   RADICAL NEPHRECTOMY;  Surgeon: Andrez Banker, MD;  Location: [facility] ORS;  Service: Urology;  Laterality: Left;    Current Outpatient Medications  Medication Sig Dispense Refill   allopurinol  (ZYLOPRIM ) 300 MG tablet Take 150 mg by mouth daily.     cholecalciferol (VITAMIN D ) 1000 UNITS tablet Take 1,000 Units by mouth daily.     cloNIDine  (CATAPRES ) 0.2 MG tablet Take 0.2 mg by mouth 3 (three) times daily.     colchicine 0.6 MG tablet Take 0.3 mg by mouth daily.     hydrALAZINE  (APRESOLINE ) 50 MG tablet Take 50 mg by mouth 3 (three) times daily.     lactulose  (CHRONULAC ) 10 GM/15ML solution Take 15 g by mouth 3 (three) times a week.     lubiprostone  (AMITIZA ) 24 MCG capsule Take 1 capsule (24 mcg total) by mouth 2 (two) times daily with a meal. 60 capsule 3   multivitamin (RENA-VIT) TABS tablet Take 1 tablet by mouth daily.     Probiotic, Lactobacillus, CAPS Take 1 capsule by mouth daily. 30 capsule 0   rosuvastatin  (CRESTOR ) 10 MG tablet Take 10 mg by mouth at bedtime.     No current facility-administered medications for this visit.    Allergies as of 07/29/2023   (No Known Allergies)    Family History  Problem Relation Age of Onset   Hypertension Mother    Cancer Father        throat cancer   Hypertension Sister     Social History   Socioeconomic History   Marital status: Single    Spouse name: Not on file   Number of children: 0   Years of education: Not on file   Highest education level: Not on file  Occupational History   Occupation: RETIRED  Tobacco Use   Smoking status: Former   Smokeless tobacco: Never  Advertising account planner   Vaping status: Never Used  Substance and Sexual Activity   Alcohol  use: Not Currently    Comment: ocassionally  QUIT SMOKING IN 2014   Drug use: Not Currently   Sexual activity: Not Currently  Other Topics Concern   Not on file  Social History Narrative   Not on file   Social Drivers of Health   Financial Resource Strain: Not on file   Food Insecurity: No Food Insecurity (04/03/2023)   Hunger Vital Sign    Worried About Running Out of Food in the Last Year: Never true    Ran Out of Food in the Last Year: Never true  Transportation Needs: No Transportation Needs (04/03/2023)   PRAPARE - Administrator, Civil Service (Medical): No    Lack of Transportation (Non-Medical): No  Physical Activity: Not on file  Stress: Not on file  Social Connections: Socially Isolated (04/03/2023)   Social Connection and Isolation Panel [NHANES]    Frequency of Communication with Friends and Family: More than three times a week    Frequency of Social Gatherings with Friends and Family: Never    Attends Religious Services: Never    Database administrator or Organizations: No    Attends Banker Meetings: Never    Marital Status: Never married  Intimate Partner Violence: Not At Risk (04/03/2023)   Humiliation, Afraid, Rape, and Kick questionnaire    Fear of Current or Ex-Partner: No    Emotionally Abused: No    Physically Abused: No    Sexually Abused: No    Review of Systems:    Constitutional: No weight loss, fever, chills, weakness or fatigue HEENT: Eyes: No change in vision               Ears, Nose, Throat:  No change in hearing or congestion Skin: No rash or itching Cardiovascular: No chest pain, chest pressure or palpitations   Respiratory: No SOB or cough Gastrointestinal: See HPI and otherwise negative Genitourinary: No dysuria or change in urinary frequency Neurological: No headache, dizziness or syncope Musculoskeletal: No new muscle or joint pain Hematologic: No bleeding or bruising Psychiatric: No history of depression or anxiety    Physical Exam:  Vital signs: BP 122/70   Pulse 92   Ht 6\' 3"  (1.905 m)   Wt 211 lb (95.7 kg)   BMI 26.37 kg/m   Constitutional: NAD, alert and cooperative Head:  Normocephalic and atraumatic. Eyes:   PEERL, EOMI. No icterus. Conjunctiva pink. Respiratory:  Respirations even and unlabored. Lungs clear to auscultation bilaterally.   No wheezes, crackles, or rhonchi.  Cardiovascular:  Regular rate and rhythm. No peripheral edema, cyanosis or pallor.  Gastrointestinal:  Soft, nondistended, nontender. No rebound or guarding. Normal bowel sounds. No appreciable masses or hepatomegaly. Rectal:  Declines Msk:  Symmetrical without gross deformities. Without edema, no deformity or joint abnormality.  Neurologic:  Alert and  oriented x4;  grossly normal neurologically.  Skin:   Dry and intact without significant lesions or rashes. Psychiatric: Oriented to person, place and time. Demonstrates good judgement and reason without abnormal affect or behaviors.  RELEVANT LABS AND IMAGING: CBC    Component Value Date/Time   WBC 5.2 04/06/2023 0404   RBC 3.75 (L) 04/06/2023 0404   HGB 10.7 (L) 04/06/2023 0404   HCT 34.1 (L) 04/06/2023 0404   PLT 123 (L) 04/06/2023 0404   MCV 90.9 04/06/2023 0404   MCH 28.5 04/06/2023 0404   MCHC 31.4 04/06/2023 0404   RDW 15.7 (H) 04/06/2023 0404   LYMPHSABS 1.3 04/02/2023 1841   MONOABS 0.6 04/02/2023 1841   EOSABS 0.1 04/02/2023 1841   BASOSABS 0.0 04/02/2023 1841    CMP     Component Value Date/Time   NA 139 04/06/2023 0404   K 4.4 04/06/2023 0404   CL 112 (H) 04/06/2023 0404   CO2 20 (L) 04/06/2023 0404   GLUCOSE 114 (H) 04/06/2023 0404   BUN 39 (H) 04/06/2023 0404   CREATININE 3.76 (H) 04/06/2023 0404   CALCIUM  8.6 (L) 04/06/2023 0404   PROT 5.1 (L) 04/03/2023 0645   ALBUMIN 2.6 (L) 04/03/2023 0645   AST 40 04/03/2023 0645   ALT 23 04/03/2023 0645   ALKPHOS 30 (L) 04/03/2023 0645   BILITOT 0.7 04/03/2023 0645   GFRNONAA 17 (L) 04/06/2023 0404   GFRAA 25 (L) 10/03/2019 1421     Assessment/Plan:   68 year old male with chronic constipation presenting after admission January 2025 for rectal pain and worsening constipation having to be manually disimpacted in ED and CT scan showing  inflammatory/infectious proctitis (likely stercoral colitis) and with history of moderate diverticulosis patient was discharged on cipro /flagyl   Chronic constipation Colonoscopy in 2023 for constipation with two polyps (6-41mm). Linzess  was not covered. Put on Amitiza  24mcg BID after colonoscopy but was  too expensive.  Currently managing with stool softeners, Senokot, lactulose  without sufficient results.  Go multiple days without a bowel movement and sometimes having straining and hard stools versus no straining and soft stools Has not tried MiraLAX in over 10 years.  Discussion with patient about options moving forward and reviewed medications in regards to coverage.  Amitiza  appears to be tier 3 but we have not tried any type of follow-up medication. - Trial of MiraLAX 1-2 capfuls daily to see how he does - Will send in Amitiza  24 mcg twice daily for 30 days and he will call with the price.  If too expensive we will see what we can do insurance as Linzess  and Trulance are worse coverage - Increase water, increase fiber, increase exercise - Advised to use squatty potty to help with bowel movements - Consider pelvic floor physical therapy - Follow-up 8 to 12 weeks  CKD stage IV  Hypertension  Kyshon Tolliver [facility] 07/29/2023, 11:07 AM  Cc: Windell Hasty, DO

## 2023-07-29 NOTE — Patient Instructions (Addendum)
 Start taking Miralax 1 capful (17 grams) 1x / day for 1 week.   If this is not effective, increase to 1 dose 2x / day for 1 week.   If this is still not effective, increase to two capfuls (34 grams) 2x / day.   Can adjust dose as needed based on response. Can take 1/2 cap daily, skip days, or increase per day.    We have sent the following medications to your pharmacy for you to pick up at your convenience:  Amitiza   _______________________________________________________  If your blood pressure at your visit was 140/90 or greater, please contact your primary care physician to follow up on this.  _______________________________________________________  If you are age 27 or older, your body mass index should be between 23-30. Your Body mass index is 26.37 kg/m. If this is out of the aforementioned range listed, please consider follow up with your Primary Care Provider.  If you are age 33 or younger, your body mass index should be between 19-25. Your Body mass index is 26.37 kg/m. If this is out of the aformentioned range listed, please consider follow up with your Primary Care Provider.   ________________________________________________________  The [facility] providers would like to encourage you to use MYCHART to communicate with providers for non-urgent requests or questions.  Due to long hold times on the telephone, sending your provider a message by [facility] may be a faster and more efficient way to get a response.  Please allow 48 business hours for a response.  Please remember that this is for non-urgent requests.  _______________________________________________________

## 2023-07-31 NOTE — Progress Notes (Signed)
 Addendum: Reviewed and agree with assessment and management plan. Asha Grumbine, Carie Caddy, MD

## 2023-10-31 ENCOUNTER — Ambulatory Visit: Payer: Medicare (Managed Care) | Admitting: Internal Medicine

## 2023-11-04 ENCOUNTER — Other Ambulatory Visit: Payer: Self-pay | Admitting: Ophthalmology

## 2023-11-04 DIAGNOSIS — H532 Diplopia: Secondary | ICD-10-CM

## 2023-11-04 DIAGNOSIS — E079 Disorder of thyroid, unspecified: Secondary | ICD-10-CM

## 2023-11-06 ENCOUNTER — Ambulatory Visit: Payer: Medicare (Managed Care) | Admitting: Internal Medicine

## 2023-11-06 ENCOUNTER — Encounter: Payer: Self-pay | Admitting: Internal Medicine

## 2023-11-06 VITALS — BP 100/50 | HR 98 | Ht 75.0 in | Wt 208.0 lb

## 2023-11-06 DIAGNOSIS — K5909 Other constipation: Secondary | ICD-10-CM | POA: Diagnosis not present

## 2023-11-06 DIAGNOSIS — R140 Abdominal distension (gaseous): Secondary | ICD-10-CM

## 2023-11-06 DIAGNOSIS — K5790 Diverticulosis of intestine, part unspecified, without perforation or abscess without bleeding: Secondary | ICD-10-CM | POA: Diagnosis not present

## 2023-11-06 DIAGNOSIS — R109 Unspecified abdominal pain: Secondary | ICD-10-CM

## 2023-11-06 DIAGNOSIS — K5904 Chronic idiopathic constipation: Secondary | ICD-10-CM

## 2023-11-06 DIAGNOSIS — K5730 Diverticulosis of large intestine without perforation or abscess without bleeding: Secondary | ICD-10-CM

## 2023-11-06 MED ORDER — LINACLOTIDE 145 MCG PO CAPS: 145.0000 ug | ORAL_CAPSULE | Freq: Every day | ORAL | 5 refills | Status: DC

## 2023-11-06 NOTE — Progress Notes (Signed)
   Subjective:    Patient ID: Brian Lawson, male    DOB: November 13, 1955, 68 y.o.   MRN: [id number]  HPI Brian Lawson is a 68 year old male with diverticulosis who presents with constipation and difficulty managing bowel movements.  He experiences ongoing constipation and difficulty achieving regular bowel movements without medication. He uses lactulose  and stool softeners, which cause significant discomfort and cramping. Lower abdominal cramping and stomach gurgling occur on the days he takes lactulose .  He has a history of diverticulosis in the left colon. Without medication, he can go four to five days without a bowel movement. He has tried Miralax in the past, but it caused illness, and he is concerned about its effects on his kidney function.  He has not tried Linzess  or Amitiza  due to cost concerns. He is currently exploring more affordable options for managing his condition.  He is taking a probiotic, which helps alleviate bloating and gas. He also has a history of gout, which is currently under control with allopurinol .  Colonoscopy from 2 years ago reviewed with 2 subcentimeter adenomas.  Review of Systems As per HPI, otherwise negative  Current Medications, Allergies, Past Medical History, Past Surgical History, Family History and Social History were reviewed in [employer] record.\    Objective:   Physical Exam BP (!) 100/50   Pulse 98   Ht 6' 3 (1.905 m)   Wt 208 lb (94.3 kg)   BMI 26.00 kg/m  Gen: awake, alert, NAD HEENT: anicteric  Abd: soft, NT/ND, +BS throughout Ext: no c/c/e Neuro: nonfocal        Assessment & Plan:   Chronic constipation exacerbated by left-sided diverticulosis Previous treatments ineffective or caused adverse effects. Linzess  and Amitiza  not tried due to cost. - Provide Linzess  samples to assess efficacy. - Start Linzess  145 mcg once daily, 30 minutes before a meal. - Discontinue stool softeners and  lactulose . - Monitor for side effects such as diarrhea and abdominal pain. - Include Medicare assistance program information in after-visit summary, could possibly ask for drug company assistance as well.  Multiple tried and failed OTC and prescription laxatives noted.  Bloating and gas managed with probiotics Bloating and gas effectively managed with probiotics. - Okay to continue OTC probiotic  Gout, currently controlled with allopurinol  Gout well-controlled with allopurinol , no recent flare-ups.  CKD-IV  Hx colonic polyps -- recall colonoscopy Sept 2030  30 minutes total spent today including patient facing time, coordination of care, reviewing medical history/procedures/pertinent radiology studies, and documentation of the encounter.

## 2023-11-06 NOTE — Patient Instructions (Addendum)
 We have sent the following medications to your pharmacy for you to pick up at your convenience: Linzess  145 mcg daily. We have given you samples to get started.  Please MyChart message our office in a few weeks with a update on your symptoms with the Linzess .  Apply for Medicare Part D Extra Help program  Extra Help, also known as Low Income Subsidy (LIS) is a Medicare program to help people with limited income and resources pay Medicare drug coverage (Part D) premiums, deductibles, coinsurance, and other costs.  Patient's can apply for Extra Help/LIS in the following ways:  - Visit [email or web address].info/ - Visit their local Social Security office - Call Social Security at [phone]  This program may be easier to qualify for than you think.

## 2023-11-12 ENCOUNTER — Encounter: Payer: Self-pay | Admitting: Internal Medicine

## 2023-11-12 ENCOUNTER — Ambulatory Visit: Payer: Medicare (Managed Care) | Attending: Internal Medicine | Admitting: Internal Medicine

## 2023-11-12 VITALS — BP 120/70 | HR 98 | Resp 16 | Ht 75.0 in | Wt 211.8 lb

## 2023-11-12 DIAGNOSIS — I700 Atherosclerosis of aorta: Secondary | ICD-10-CM | POA: Diagnosis not present

## 2023-11-12 DIAGNOSIS — I429 Cardiomyopathy, unspecified: Secondary | ICD-10-CM

## 2023-11-12 DIAGNOSIS — R072 Precordial pain: Secondary | ICD-10-CM

## 2023-11-12 DIAGNOSIS — E785 Hyperlipidemia, unspecified: Secondary | ICD-10-CM | POA: Diagnosis not present

## 2023-11-12 DIAGNOSIS — Z6827 Body mass index (BMI) 27.0-27.9, adult: Secondary | ICD-10-CM

## 2023-11-12 DIAGNOSIS — I10 Essential (primary) hypertension: Secondary | ICD-10-CM

## 2023-11-12 DIAGNOSIS — N184 Chronic kidney disease, stage 4 (severe): Secondary | ICD-10-CM

## 2023-11-12 MED ORDER — METOPROLOL SUCCINATE ER 25 MG PO TB24: 25.0000 mg | ORAL_TABLET | Freq: Every day | ORAL | 3 refills | Status: AC

## 2023-11-12 MED ORDER — HYDRALAZINE HCL 25 MG PO TABS: 25.0000 mg | ORAL_TABLET | Freq: Three times a day (TID) | ORAL | 3 refills | Status: AC

## 2023-11-12 MED ORDER — CLOPIDOGREL BISULFATE 75 MG PO TABS: 75.0000 mg | ORAL_TABLET | Freq: Every day | ORAL | 3 refills | Status: AC

## 2023-11-12 NOTE — Patient Instructions (Addendum)
 Medication Instructions:  Your physician has recommended you make the following change in your medication:   1) DECREASE hydralazine  to 25 mg three times daily 2) START metoprolol  succinate (Toprol  XL) 25 mg daily at bedtime 3) START clopidogrel  (Plavix ) 75 mg daily  *If you need a refill on your cardiac medications before your next appointment, please call your pharmacy*  Lab Work: TODAY: Lipid panel, LFTs, LP(a) If you have labs (blood work) drawn today and your tests are completely normal, you will receive your results only by: MyChart Message (if you have MyChart) OR A paper copy in the mail If you have any lab test that is abnormal or we need to change your treatment, we will call you to review the results.  Testing: Your physician has requested that you have an echocardiogram. Echocardiography is a painless test that uses sound waves to create images of your heart. It provides your doctor with information about the size and shape of your heart and how well your heart's chambers and valves are working. This procedure takes approximately one hour. There are no restrictions for this procedure. Please do NOT wear cologne, perfume, aftershave, or lotions (deodorant is allowed). Please arrive 15 minutes prior to your appointment time.  Please note: We ask at that you not bring children with you during ultrasound (echo/ vascular) testing. Due to room size and safety concerns, children are not allowed in the ultrasound rooms during exams. Our front office staff cannot provide observation of children in our lobby area while testing is being conducted. An adult accompanying a patient to their appointment will only be allowed in the ultrasound room at the discretion of the ultrasound technician under special circumstances. We apologize for any inconvenience.  Follow-Up: At [facility], you and your health needs are our priority.  As part of our continuing mission to provide you with  exceptional heart care, our providers are all part of one team.  This team includes your primary Cardiologist (physician) and Advanced Practice Providers or APPs (Physician Assistants and Nurse Practitioners) who all work together to provide you with the care you need, when you need it.  Your next appointment:   6 month(s)  Provider:   Arun K Thukkani, MD  We recommend signing up for the patient portal called MyChart.  Sign up information is provided on this After Visit Summary.  MyChart is used to connect with patients for Virtual Visits (Telemedicine).  Patients are able to view lab/test results, encounter notes, upcoming appointments, etc.  Non-urgent messages can be sent to your provider as well.    To learn more about what you can do with MyChart, go to [email or web address].

## 2023-11-12 NOTE — Progress Notes (Signed)
 Cardiology Office Note:   Date:  11/12/2023  ID:  Brian Lawson, DOB [birth year], MRN [id number] PCP:  Brian Skates, DO  [facility] Providers Cardiologist:  Brian Haws, MD Referring MD: Brian Skates, DO  Chief Complaint/Reason for Referral: Chest pain ASSESSMENT:    1. Cardiomyopathy, unspecified type (HCC)   2. Precordial pain   3. Aortic atherosclerosis (HCC)   4. Hyperlipidemia LDL goal <70   5. Primary hypertension   6. CKD (chronic kidney disease) stage 4, GFR 15-29 ml/min (HCC)   7. BMI 27.0-27.9,adult      PLAN:   In order of problems listed above: Cardiomyopathy: Start Toprol  25 mg at bedtime and consider SGLT2 inhibitor; obtain echocardiogram in 3 months. Chest pain: PET stress test and echocardiogram were reassuring. Aortic atherosclerosis: Continue Crestor  10 mg and start Plavix  75 mg. Hyperlipidemia: Continue Crestor  10 mg, start Plavix  75 mg, check lipid panel, LFTs, LP(a) today Hypertension: Decrease hydralazine  to 25 mg 3 times daily and start Toprol  25 mg at bedtime to address cardiomyopathy; patient has had occasional low blood pressures. CKD stage IV: Consider SGLT2 inhibitor for renal protection; will discuss with patient's nephrologist.     Elevated BMI: Diet and exercise modification  I spent 33 minutes reviewing all clinical data during and prior to this visit including all relevant imaging studies, laboratories, clinical information from other health systems and prior notes from both Cardiology and other specialties, interviewing the patient, conducting a complete physical examination, and coordinating care in order to formulate a comprehensive and personalized evaluation and treatment plan.   Informed Consent   Shared Decision Making/Informed Consent The risks [chest pain, shortness of breath, cardiac arrhythmias, dizziness, blood pressure fluctuations, myocardial infarction, stroke/transient ischemic attack, nausea, vomiting, allergic reaction,  radiation exposure, metallic taste sensation and life-threatening complications (estimated to be 1 in 10,000)], benefits (risk stratification, diagnosing coronary artery disease, treatment guidance) and alternatives of a cardiac PET stress test were discussed in detail with Brian Lawson and he agrees to proceed.      Dispo:  Return in about 6 months (around 05/11/2024).      Medication Adjustments/Labs and Tests Ordered: Current medicines are reviewed at length with the patient today.  Concerns regarding medicines are outlined above.  The following changes have been made:     Labs/tests ordered: Orders Placed This Encounter  Procedures   Lipid panel   Hepatic function panel   Lipoprotein A (LPA)   ECHOCARDIOGRAM COMPLETE    Medication Changes: Meds ordered this encounter  Medications   hydrALAZINE  (APRESOLINE ) 25 MG tablet    Sig: Take 1 tablet (25 mg total) by mouth 3 (three) times daily.    Dispense:  270 tablet    Refill:  3    Dose change (decreased on 11/12/23).   metoprolol  succinate (TOPROL -XL) 25 MG 24 hr tablet    Sig: Take 1 tablet (25 mg total) by mouth at bedtime.    Dispense:  90 tablet    Refill:  3   clopidogrel  (PLAVIX ) 75 MG tablet    Sig: Take 1 tablet (75 mg total) by mouth daily.    Dispense:  90 tablet    Refill:  3    Current medicines are reviewed at length with the patient today.  The patient does not have concerns regarding medicines.     History of Present Illness:      FOCUSED PROBLEM LIST:   Chest pain Low risk PET stress fibber 2025 EF 50%, pseudo dyskinesis inferolateral wall  TTE [name] 2025 Hypertension Hyperlipidemia Intolerant of atorvastatin Aortic atherosclerosis CT abdomen pelvis 2022 Intolerant of aspirin  >> 5 to contribute or exacerbate constipation/proctitis CKD stage IV Renal cell carcinoma status post nephrectomy 2015 BMI 27  12/24: The patient is a 68 year old male with the above listed medical problems referred to their  PCP for recommendations regarding atypical chest pain.  The patient was seen recently and reported chest pain that occurred at rest and occasionally with exertion.  It was not reproducible with exertion.  Due to a lack of energy and the symptoms he is referred to cardiology for further recommendations.  The patient tells me that over the last 9 months he has developed more frequent episodes of chest discomfort.  This typically happens at rest and is sometimes modifiable by rubbing on his chest.  He occasionally gets short of breath and chest tightness when he walks but this does not happen on a regular basis.  He denies any peripheral edema or paroxysmal nocturnal dyspnea.  He does have a right upper extremity fistula in place that is not enlarged and has not yet been used.  He is pretty compliant with his medical therapy.  He tells me that his blood pressure was fairly stable last week though he had a question about whether his hydralazine  dose needed to be decreased.  He fortunately has not required any emergency room visits or hospitalizations.  Plan: Obtain PET stress test and echocardiogram.  Start aspirin  81 mg; decrease hydralazine  to 50 mg 3 times daily.  September 2025:  Patient consents to use of AI scribe. In the interim the patient's PET stress test and echocardiogram are reassuring.  His ejection fraction was 50% with pseudodyskinesis of the inferolateral wall.  He has experienced fluctuating blood pressure readings recently, with notably low readings during his last two checkups. A few weeks ago, his blood pressure was particularly low, though he did not experience any symptoms. He is currently on hydralazine , which was previously reduced in dosage.  No current chest pain, breathing difficulties, lightheadedness, blacking out spells, or leg swelling.  He has chronic kidney disease and continues to produce urine without requiring dialysis. He has a fistula in place for over ten years.   He  reports dealing with chronic constipation for several years, which led to a recent visit to the emergency department.         Current Medications: Current Meds  Medication Sig   allopurinol  (ZYLOPRIM ) 300 MG tablet Take 150 mg by mouth daily. (Patient taking differently: Take 200 mg by mouth daily.)   cholecalciferol (VITAMIN D ) 1000 UNITS tablet Take 1,000 Units by mouth daily.   cloNIDine  (CATAPRES ) 0.2 MG tablet Take 0.2 mg by mouth 3 (three) times daily.   clopidogrel  (PLAVIX ) 75 MG tablet Take 1 tablet (75 mg total) by mouth daily.   hydrALAZINE  (APRESOLINE ) 25 MG tablet Take 1 tablet (25 mg total) by mouth 3 (three) times daily.   lactulose  (CHRONULAC ) 10 GM/15ML solution Take 15 g by mouth 3 (three) times a week.   linaclotide  (LINZESS ) 145 MCG CAPS capsule Take 1 capsule (145 mcg total) by mouth daily before breakfast.   metoprolol  succinate (TOPROL -XL) 25 MG 24 hr tablet Take 1 tablet (25 mg total) by mouth at bedtime.   multivitamin (RENA-VIT) TABS tablet Take 1 tablet by mouth daily.   Probiotic, Lactobacillus, CAPS Take 1 capsule by mouth daily.   rosuvastatin  (CRESTOR ) 10 MG tablet Take 10 mg by mouth at bedtime.   [DISCONTINUED]  hydrALAZINE  (APRESOLINE ) 50 MG tablet Take 50 mg by mouth 3 (three) times daily.     Review of Systems:   Please see the history of present illness.    All other systems reviewed and are negative.     EKGs/Labs/Other Test Reviewed:   EKG: EKG 2024 sinus tachycardia, LVH, T wave inversions inferiorly  EKG Interpretation Date/Time:    Ventricular Rate:    PR Interval:    QRS Duration:    QT Interval:    QTC Calculation:   R Axis:      Text Interpretation:           Risk Assessment/Calculations:          Physical Exam:   VS:  BP 120/70 (BP Location: Left Arm, Patient Position: Sitting, Cuff Size: Normal)   Pulse 98   Resp 16   Ht 6' 3 (1.905 m)   Wt 211 lb 12.8 oz (96.1 kg)   SpO2 96%   BMI 26.47 kg/m        Wt Readings  from Last 3 Encounters:  11/12/23 211 lb 12.8 oz (96.1 kg)  11/06/23 208 lb (94.3 kg)  07/29/23 211 lb (95.7 kg)      GENERAL:  No apparent distress, AOx3 HEENT:  No carotid bruits, +2 carotid impulses, no scleral icterus CAR: RRR  no murmurs, gallops, rubs, or thrills RES:  Clear to auscultation bilaterally ABD:  Soft, nontender, nondistended, positive bowel sounds x 4 VASC:  +2 radial pulses, +2 carotid pulses NEURO:  CN 2-12 grossly intact; motor and sensory grossly intact PSYCH:  No active depression or anxiety EXT:  No edema, ecchymosis, or cyanosis; right upper extremity fistula  Signed, Krishawn Vanderweele K Ahri Olson, MD  11/12/2023 10:11 AM    [facility] [address] [place], [place], [place]  [address] Phone: [phone]; Fax: [phone]   Note:  This document was prepared using Dragon voice recognition software and may include unintentional dictation errors.

## 2023-11-13 ENCOUNTER — Ambulatory Visit: Payer: Self-pay | Admitting: Internal Medicine

## 2023-11-13 LAB — LIPID PANEL
Chol/HDL Ratio: 3.2 ratio (ref 0.0–5.0)
Cholesterol, Total: 98 mg/dL — ABNORMAL LOW (ref 100–199)
HDL: 31 mg/dL — ABNORMAL LOW (ref >39)
LDL Chol Calc (NIH): 46 mg/dL (ref 0–99)
Triglycerides: 115 mg/dL (ref 0–149)
VLDL Cholesterol Cal: 21 mg/dL (ref 5–40)

## 2023-11-13 LAB — LIPOPROTEIN A (LPA): Lipoprotein (a): 197.2 nmol/L — ABNORMAL HIGH

## 2023-11-13 LAB — HEPATIC FUNCTION PANEL
ALT: 13 IU/L (ref 0–44)
AST: 20 IU/L (ref 0–40)
Albumin: 4 g/dL (ref 3.9–4.9)
Alkaline Phosphatase: 83 IU/L (ref 44–121)
Bilirubin Total: 0.3 mg/dL (ref 0.0–1.2)
Bilirubin, Direct: 0.12 mg/dL (ref 0.00–0.40)
Total Protein: 7.2 g/dL (ref 6.0–8.5)

## 2023-11-19 ENCOUNTER — Ambulatory Visit (HOSPITAL_COMMUNITY)
Admission: RE | Admit: 2023-11-19 | Discharge: 2023-11-19 | Disposition: A | Discharge status: Home / Self Care | Payer: Medicare (Managed Care) | Source: Ambulatory Visit | Attending: Cardiovascular Disease | Admitting: Cardiovascular Disease

## 2023-11-19 ENCOUNTER — Ambulatory Visit
Admission: RE | Admit: 2023-11-19 | Discharge: 2023-11-19 | Disposition: A | Discharge status: Home / Self Care | Payer: Medicare (Managed Care) | Source: Ambulatory Visit | Attending: Ophthalmology | Admitting: Ophthalmology

## 2023-11-19 ENCOUNTER — Other Ambulatory Visit: Payer: Medicare (Managed Care)

## 2023-11-19 DIAGNOSIS — I10 Essential (primary) hypertension: Secondary | ICD-10-CM | POA: Insufficient documentation

## 2023-11-19 DIAGNOSIS — I429 Cardiomyopathy, unspecified: Secondary | ICD-10-CM | POA: Diagnosis present

## 2023-11-19 DIAGNOSIS — H532 Diplopia: Secondary | ICD-10-CM

## 2023-11-19 DIAGNOSIS — E079 Disorder of thyroid, unspecified: Secondary | ICD-10-CM

## 2023-11-19 LAB — ECHOCARDIOGRAM COMPLETE
Area-P 1/2: 3.34 cm2
S' Lateral: 3.87 cm

## 2023-11-20 ENCOUNTER — Encounter: Payer: Self-pay | Admitting: Internal Medicine

## 2023-11-20 MED ORDER — LINACLOTIDE 290 MCG PO CAPS: 290.0000 ug | ORAL_CAPSULE | Freq: Every day | ORAL | 0 refills | Status: DC

## 2023-11-20 NOTE — Telephone Encounter (Signed)
 Yes I would have him increase to Linzess  290 mcg daily. JMP

## 2023-12-25 ENCOUNTER — Encounter: Payer: Self-pay | Admitting: Internal Medicine

## 2023-12-25 ENCOUNTER — Other Ambulatory Visit: Payer: Self-pay

## 2023-12-25 MED ORDER — TRULANCE 3 MG PO TABS: 3.0000 mg | ORAL_TABLET | Freq: Every day | ORAL | 3 refills | Status: DC

## 2023-12-29 ENCOUNTER — Other Ambulatory Visit: Payer: Self-pay

## 2023-12-29 MED ORDER — LINACLOTIDE 290 MCG PO CAPS: 290.0000 ug | ORAL_CAPSULE | Freq: Every day | ORAL | 3 refills | Status: DC

## 2023-12-29 MED ORDER — LINACLOTIDE 290 MCG PO CAPS: 290.0000 ug | ORAL_CAPSULE | Freq: Every day | ORAL | 3 refills | Status: AC

## 2023-12-30 ENCOUNTER — Other Ambulatory Visit: Payer: Self-pay

## 2024-01-21 ENCOUNTER — Telehealth: Payer: Self-pay

## 2024-01-21 NOTE — Telephone Encounter (Signed)
 Received a fax from [employer] patient assistance regarding patient's Linzess  and that they are placing the application on hold. The fax states that the patient may qualify for Low-income subsidy. Called patient to make him aware. Patient states he received a email this morning and is aware that he has to apply.

## 2024-02-19 ENCOUNTER — Ambulatory Visit: Payer: Medicare (Managed Care) | Admitting: Internal Medicine

## 2024-03-18 ENCOUNTER — Encounter: Payer: Self-pay | Admitting: Internal Medicine
